# Patient Record
Sex: Male | Born: 1950
Health system: Southern US, Community
[De-identification: ages and names within clinical notes are randomized; demographics above are authoritative.]

## PROBLEM LIST (undated history)

## (undated) DIAGNOSIS — K219 Gastro-esophageal reflux disease without esophagitis: Secondary | ICD-10-CM

## (undated) DIAGNOSIS — G4733 Obstructive sleep apnea (adult) (pediatric): Secondary | ICD-10-CM

## (undated) DIAGNOSIS — I2510 Atherosclerotic heart disease of native coronary artery without angina pectoris: Secondary | ICD-10-CM

## (undated) DIAGNOSIS — K429 Umbilical hernia without obstruction or gangrene: Secondary | ICD-10-CM

## (undated) DIAGNOSIS — I2119 ST elevation (STEMI) myocardial infarction involving other coronary artery of inferior wall: Secondary | ICD-10-CM

## (undated) DIAGNOSIS — IMO0001 Reserved for inherently not codable concepts without codable children: Secondary | ICD-10-CM

## (undated) DIAGNOSIS — I4819 Other persistent atrial fibrillation: Secondary | ICD-10-CM

## (undated) DIAGNOSIS — R0609 Other forms of dyspnea: Secondary | ICD-10-CM

## (undated) DIAGNOSIS — Z951 Presence of aortocoronary bypass graft: Secondary | ICD-10-CM

## (undated) DIAGNOSIS — E785 Hyperlipidemia, unspecified: Secondary | ICD-10-CM

## (undated) DIAGNOSIS — F419 Anxiety disorder, unspecified: Secondary | ICD-10-CM

## (undated) DIAGNOSIS — Z9861 Coronary angioplasty status: Secondary | ICD-10-CM

## (undated) DIAGNOSIS — C44319 Basal cell carcinoma of skin of other parts of face: Secondary | ICD-10-CM

## (undated) DIAGNOSIS — M1990 Unspecified osteoarthritis, unspecified site: Secondary | ICD-10-CM

## (undated) DIAGNOSIS — R0600 Dyspnea, unspecified: Secondary | ICD-10-CM

## (undated) DIAGNOSIS — I10 Essential (primary) hypertension: Secondary | ICD-10-CM

## (undated) HISTORY — PX: NM MYOVIEW LTD: HXRAD82

## (undated) HISTORY — DX: Reserved for inherently not codable concepts without codable children: IMO0001

## (undated) HISTORY — DX: Hyperlipidemia, unspecified: E78.5

## (undated) HISTORY — DX: Obstructive sleep apnea (adult) (pediatric): G47.33

## (undated) HISTORY — DX: Other persistent atrial fibrillation: I48.19

## (undated) HISTORY — PX: COLONOSCOPY: SHX174

## (undated) HISTORY — DX: Presence of aortocoronary bypass graft: Z95.1

## (undated) HISTORY — DX: Atherosclerotic heart disease of native coronary artery without angina pectoris: I25.10

## (undated) HISTORY — PX: OTHER SURGICAL HISTORY: SHX169

## (undated) HISTORY — PX: SKIN CANCER EXCISION: SHX779

## (undated) HISTORY — DX: Dyspnea, unspecified: R06.00

## (undated) HISTORY — DX: Other forms of dyspnea: R06.09

## (undated) HISTORY — DX: ST elevation (STEMI) myocardial infarction involving other coronary artery of inferior wall: I21.19

## (undated) HISTORY — DX: Umbilical hernia without obstruction or gangrene: K42.9

## (undated) HISTORY — PX: TONSILLECTOMY: SUR1361

## (undated) HISTORY — DX: Basal cell carcinoma of skin of other parts of face: C44.319

## (undated) HISTORY — DX: Coronary angioplasty status: Z98.61

---

## 2000-01-24 ENCOUNTER — Encounter: Payer: Self-pay | Admitting: Emergency Medicine

## 2000-01-25 ENCOUNTER — Inpatient Hospital Stay (HOSPITAL_COMMUNITY): Admission: EM | Admit: 2000-01-25 | Discharge: 2000-01-29 | Payer: Self-pay | Admitting: Emergency Medicine

## 2000-01-25 DIAGNOSIS — I2119 ST elevation (STEMI) myocardial infarction involving other coronary artery of inferior wall: Secondary | ICD-10-CM

## 2000-01-25 HISTORY — PX: PERCUTANEOUS CORONARY STENT INTERVENTION (PCI-S): SHX6016

## 2000-01-25 HISTORY — DX: ST elevation (STEMI) myocardial infarction involving other coronary artery of inferior wall: I21.19

## 2000-06-26 ENCOUNTER — Encounter: Payer: Self-pay | Admitting: Family Medicine

## 2000-06-26 ENCOUNTER — Encounter: Admission: RE | Admit: 2000-06-26 | Discharge: 2000-06-26 | Payer: Self-pay | Admitting: Family Medicine

## 2001-01-30 ENCOUNTER — Inpatient Hospital Stay (HOSPITAL_COMMUNITY): Admission: EM | Admit: 2001-01-30 | Discharge: 2001-02-01 | Payer: Self-pay | Admitting: Emergency Medicine

## 2001-01-30 ENCOUNTER — Encounter: Payer: Self-pay | Admitting: Emergency Medicine

## 2001-01-30 HISTORY — PX: PERCUTANEOUS CORONARY STENT INTERVENTION (PCI-S): SHX6016

## 2001-11-05 ENCOUNTER — Inpatient Hospital Stay (HOSPITAL_COMMUNITY): Admission: AD | Admit: 2001-11-05 | Discharge: 2001-11-10 | Payer: Self-pay | Admitting: Unknown Physician Specialty

## 2001-11-05 ENCOUNTER — Encounter: Payer: Self-pay | Admitting: *Deleted

## 2001-11-05 DIAGNOSIS — I2510 Atherosclerotic heart disease of native coronary artery without angina pectoris: Secondary | ICD-10-CM | POA: Insufficient documentation

## 2001-11-05 HISTORY — DX: Atherosclerotic heart disease of native coronary artery without angina pectoris: I25.10

## 2001-11-05 HISTORY — PX: CARDIAC CATHETERIZATION: SHX172

## 2001-11-06 ENCOUNTER — Encounter: Payer: Self-pay | Admitting: Surgery

## 2001-11-06 DIAGNOSIS — Z951 Presence of aortocoronary bypass graft: Secondary | ICD-10-CM

## 2001-11-06 HISTORY — PX: CORONARY ARTERY BYPASS GRAFT: SHX141

## 2001-11-06 HISTORY — DX: Presence of aortocoronary bypass graft: Z95.1

## 2001-11-07 ENCOUNTER — Encounter: Payer: Self-pay | Admitting: Surgery

## 2001-11-08 ENCOUNTER — Encounter: Payer: Self-pay | Admitting: Surgery

## 2001-12-03 ENCOUNTER — Encounter: Admission: RE | Admit: 2001-12-03 | Discharge: 2001-12-03 | Payer: Self-pay | Admitting: Surgery

## 2001-12-03 ENCOUNTER — Encounter: Payer: Self-pay | Admitting: Surgery

## 2003-09-12 ENCOUNTER — Emergency Department (HOSPITAL_COMMUNITY): Admission: EM | Admit: 2003-09-12 | Discharge: 2003-09-12 | Payer: Self-pay | Admitting: *Deleted

## 2004-09-10 ENCOUNTER — Emergency Department (HOSPITAL_COMMUNITY): Admission: EM | Admit: 2004-09-10 | Discharge: 2004-09-10 | Payer: Self-pay | Admitting: Emergency Medicine

## 2005-06-02 ENCOUNTER — Emergency Department (HOSPITAL_COMMUNITY): Admission: EM | Admit: 2005-06-02 | Discharge: 2005-06-03 | Payer: Self-pay | Admitting: Emergency Medicine

## 2008-10-15 ENCOUNTER — Emergency Department (HOSPITAL_COMMUNITY): Admission: EM | Admit: 2008-10-15 | Discharge: 2008-10-16 | Payer: Self-pay | Admitting: Emergency Medicine

## 2009-06-07 ENCOUNTER — Encounter: Admission: RE | Admit: 2009-06-07 | Discharge: 2009-06-07 | Payer: Self-pay | Admitting: Family Medicine

## 2009-06-30 DIAGNOSIS — Z951 Presence of aortocoronary bypass graft: Secondary | ICD-10-CM

## 2009-07-01 ENCOUNTER — Ambulatory Visit: Payer: Self-pay | Admitting: Emergency Medicine

## 2009-07-01 DIAGNOSIS — I2119 ST elevation (STEMI) myocardial infarction involving other coronary artery of inferior wall: Secondary | ICD-10-CM | POA: Insufficient documentation

## 2009-07-01 DIAGNOSIS — J449 Chronic obstructive pulmonary disease, unspecified: Secondary | ICD-10-CM

## 2009-07-01 DIAGNOSIS — J4489 Other specified chronic obstructive pulmonary disease: Secondary | ICD-10-CM | POA: Insufficient documentation

## 2009-07-27 ENCOUNTER — Encounter: Payer: Self-pay | Admitting: Emergency Medicine

## 2009-08-03 ENCOUNTER — Encounter: Payer: Self-pay | Admitting: Emergency Medicine

## 2009-08-03 ENCOUNTER — Ambulatory Visit: Payer: Self-pay | Admitting: Emergency Medicine

## 2010-12-06 LAB — DIFFERENTIAL
Basophils Absolute: 0 10*3/uL (ref 0.0–0.1)
Basophils Relative: 0 % (ref 0–1)
Eosinophils Absolute: 0.2 10*3/uL (ref 0.0–0.7)
Eosinophils Relative: 2 % (ref 0–5)
Lymphocytes Relative: 20 % (ref 12–46)
Lymphs Abs: 1.9 10*3/uL (ref 0.7–4.0)
Monocytes Absolute: 0.7 10*3/uL (ref 0.1–1.0)
Monocytes Relative: 7 % (ref 3–12)
Neutro Abs: 6.7 10*3/uL (ref 1.7–7.7)
Neutrophils Relative %: 71 % (ref 43–77)

## 2010-12-06 LAB — POCT I-STAT, CHEM 8
BUN: 16 mg/dL (ref 6–23)
Calcium, Ion: 1.17 mmol/L (ref 1.12–1.32)
Chloride: 104 mEq/L (ref 96–112)
Creatinine, Ser: 1.3 mg/dL (ref 0.4–1.5)
Glucose, Bld: 160 mg/dL — ABNORMAL HIGH (ref 70–99)
HCT: 51 % (ref 39.0–52.0)
Hemoglobin: 17.3 g/dL — ABNORMAL HIGH (ref 13.0–17.0)
Potassium: 3.7 mEq/L (ref 3.5–5.1)
Sodium: 140 mEq/L (ref 135–145)
TCO2: 25 mmol/L (ref 0–100)

## 2010-12-06 LAB — CBC
HCT: 48.1 % (ref 39.0–52.0)
Hemoglobin: 16.4 g/dL (ref 13.0–17.0)
MCHC: 34 g/dL (ref 30.0–36.0)
MCV: 94 fL (ref 78.0–100.0)
Platelets: 254 10*3/uL (ref 150–400)
RBC: 5.12 MIL/uL (ref 4.22–5.81)
RDW: 13.6 % (ref 11.5–15.5)
WBC: 9.4 10*3/uL (ref 4.0–10.5)

## 2010-12-06 LAB — GLUCOSE, CAPILLARY: Glucose-Capillary: 174 mg/dL — ABNORMAL HIGH (ref 70–99)

## 2011-01-04 HISTORY — PX: TRANSTHORACIC ECHOCARDIOGRAM: SHX275

## 2011-01-06 NOTE — Discharge Summary (Signed)
[place]. [facility]  Patient:    Colin, Ryan Visit Number: [id number] MRN: [id number]          Service Type: MED Location: 2000 2009 01 Attending Physician:  Cleatrice Burke Dictated by:   Maxwell Marion, R.N.F.A. Admit Date:  11/05/2001 Discharge Date: 11/10/2001   CC:         Onalee Hua B. Georgina Pillion, M.D.  Lenise Herald, M.D.   Discharge Summary  DATE OF BIRTH:  1951-07-05.  ADMITTING DIAGNOSIS:  Coronary artery disease.  DISCHARGE DIAGNOSIS:  Three-vessel coronary artery disease including left main disease, status post coronary artery bypass grafting.  HISTORY OF PRESENT ILLNESS:  [name] is a 60 year old Caucasian man who is status post MI in June 2001.  On January 25, 2000, he had a stent placement with initially good results.  He was discharged home but had recurrent symptoms. He was readmitted to the hospital, underwent repeat catheterization and stent placement on January 31, 2000.  He has done well since then except that he and his wife report a several-month history of increasing fatigue, also occasional mild substernal chest pressure.  He was evaluated on August 23, 2001.  He underwent a Cardiolite stress test, which revealed inferior wall thinning and ischemia and an ejection fraction of approximately 56%.  He was evaluated at [facility] on February 25.  It was then recommended he undergo cardiac catheterization.  As this was going to be a possible brachytherapy procedure, he was scheduled for March 18.  HOSPITAL COURSE:  Mr. Sakata was admitted to [facility] in the care of [facility] on November 05, 2001.  He underwent a cardiac catheterization which revealed significant two-vessel coronary artery disease, including significant left main disease.  A mildly decreased left ventricular function.  CVTS consult was obtained with Dr. Evelene Croon.  After examination of the  patient and review of the records and the catheterization films, Dr. Laneta Simmers recommended coronary artery bypass grafting as preferred treatment of choice for this gentleman.  The procedure risks and benefits were discussed with the patient and his wife.  They agreed to proceed.  Doppler studies performed on March 18 revealed no significant carotid artery disease. His upper extremity Doppler studies were within normal limits, and he was noted to have bilateral palpable pedal pulses.  On March 19, Mr. Doty underwent an uncomplicated coronary artery bypass grafting x3 with Dr. Evelene Croon.  Grafts placed at the time of the procedure:  Left internal mammary artery to the left anterior descending artery, right internal mammary artery to the right coronary artery, left radial artery was grafted to the circumflex obtuse marginal artery.  He tolerated this procedure well and was transferred in stable condition to the SICU.  Mr. Scallon postoperative course has been uneventful.  He has remained hemodynamically stable.  His CBGs were elevated in the postoperative period. A hemoglobin a1C drawn was at 5.9.  His CBGs have since returned to the normal range by postoperative day 3.  Mr. Reddy is progressing very well in recovery from his surgery.  It is anticipated he will be ready for discharge home tomorrow, November 10, 2001.  CONDITION ON DISCHARGE:  Improved.  DISCHARGE INSTRUCTIONS:  Instructions on discharge include medications on discharge, activity, diet, wound care, and follow-up appointments.  Please see discharge instruction sheet for details.  DISCHARGE MEDICATIONS: 1. Enteric-coated aspirin 325 mg p.o. q.d. 2. Tylox one to two p.o. q.4-6h. p.r.n. for  pain. 3. Metoprolol 25 mg p.o. q.12h. 4. Lasix 40 mg p.o. q.d. x5 days. 5. Potassium chloride 20 mEq p.o. q.d. x5 days.  He is instructed to resume the following medications: 1. Lipitor 10 mg p.o. q.d. 2. Isosorbide 30 mg p.o.  q.d. 3. Niacin 500 mg p.o. q.d.  FOLLOW-UP: 1. He has been asked to get an appointment to see Dr. Jenne Campus at his office    in approximately two weeks. 2. He will have an appointment to see Dr. Laneta Simmers at the CVTS office in    approximately three weeks.  The office will call with the date and time    for that appointment.  He also will be asked to get a chest x-ray at [facility]    approximately one hour before that appointment.     PAST MEDICAL HISTORY: 1. Coronary artery disease, status post MI 2001. 2. Hyperlipidemia. 3. Hypertension.  ALLERGIES:  No known drug allergies. Dictated by:   Maxwell Marion, R.N.F.A. Attending Physician:  Cleatrice Burke DD:  11/09/01 TD:  11/11/01 Job: [id number]

## 2011-01-06 NOTE — Cardiovascular Report (Signed)
[place]. [facility]  Patient:    Colin Ryan, Colin Ryan                         MRN: [id number] Proc. Date: 01/30/01 Adm. Date:  [id number] Attending:  Ruta Hinds                        Cardiac Catheterization  PROCEDURES: 1. Left heart catheterization. 2. Coronary angiography. 3. Left ventriculogram. 4. Left anterior descending - proximal; placement of intracoronary stent    x2.  COMPLICATIONS:  None.  INDICATIONS:  The patient is a 60 year old, white male, patient of Dr. Lenise Herald with a history of acute MI in June of 2001 and underwent subsequent PTCA and stenting of his RCA.  The patient has continued to use tobacco intermittently.  He subsequently had a stress Cardiolite revealing no evidence of ischemia in his LAD territory with mild peri-infarct ischemia in the inferior wall.  Of note, he was noted to have a 60% proximal LAD lesion at his last catheterization with 80% lesion of a small diagonal.  He presented to the ER on January 30, 2001, with stuttering chest pain.  He is now brought back to the cardiac catheterization lab to define his coronary anatomy.  DESCRIPTION OF PROCEDURE:  After given informed written consent, the patient was brought to the cardiac catheterization lab where right and left groins were shaved, prepped, and draped in the usual sterile fashion.  ECG monitoring was established.  Using modified Seldinger technique, a #6 French arterial sheath was inserted in the right femoral artery.  A 6 French diagnostic catheter was then used to perform diagnostic angiography.  This reveals a large left main with proximal 50% ostial stenosis.  The LAD is a large vessel which coursed to the apex and gave rise to two diagonal branches.  There is a 90% at the proximal LAD stenotic lesion just prior to the takeoff of the first diagonal and septal perforator.  The remainder of the LAD is irregular but has no high-grade lesion.  The  first diagonal is a small vessel with an 80% ostial lesion.  The second diagonal is a medium sized vessel with no significant disease.  Left circumflex is a medium sized vessel which coursed in the AV groove and gave rise to one large obtuse marginal branch.  The AV groove circumflex is noted to have a 50% ostial lesion but no high-grade stenosis.  The first OM is a large vessel which bifurcates distally and has a 30% mid vessel stenotic lesion.  The right coronary artery is a medium sized vessel which is dominant and gives rise to both the PDA as well as the posterolateral branch.  There is a stent noted in the mid RCA in the area of a shepherds crook.  There is mild 30-40% irregularities throughout the mid RCA stent with a 60% stenotic lesion distal to the stent which appears to be mildly hypodense.  There is a large PDA and posterolateral branch with no significant disease.  There is TIMI-3 flow in the distal vessel.  LEFT VENTRICULOGRAM:  Left ventriculogram reveals a mildly depressed EF of 45-50% with mild anterolateral hypokinesis.  HEMODYNAMICS:  Systemic arterial pressure 120/78, LV systemic pressure 122/5, LVEDP of 10.  INTERVENTIONAL PROCEDURE:  LAD - proximal:  Following diagnostic angiography a #6 arterial sheath was exchanged for a #7 [place] arterial sheath, a #7 [place] guiding catheter  was coaxially engaged in the left coronary ostium. Selective coronary angiogram was then performed.  Next, a 0.014 short Forte guide wire was advanced out of the guiding catheter into the proximal LAD. The guide wire was positioned in the distal LAD without difficulty.  Measurements were made of the LAD stenotic lesion with a Forte wire on the way to apex of the LAD.  Next, a Bx Velocity 3.0 x 18 mm coronary stent was then positioned over the proximal LAD stenotic lesion.  This stent was then deployed to a maximum of 16 atmospheres for a total of one minute.  Followup  angiogram revealed no evidence of thrombus.  However, there was a small stepdown at the distal portion of the stent which did not appear to be flow-limiting.  This stent balloon was then removed and a second Bx Velocity 3.0 x 8 mm stent was then positioned across the stepdown area with careful attention to overlap the distal portion of the stent.  This stent was then deployed to a maximum of 14 atmospheres for a total of one minute.  The balloon was then pulled back over the overlapping segment and one additional inflation to a maximum of 16 atmospheres was then performed for a total of 30 seconds.  Followup angiogram revealed no evidence of dissection or thrombus with TIMI-3 flow of the distal vessel.  IV Integrilin was given.  Intravenous doses of heparin were given to maintain the ACT between 200 and 300.  Final orthogonal angiograms reveal less than 10% residual stenosis in the proximal LAD stenotic lesion with TIMI-3 flow to the distal vessel.  At this point we elected to conclude the procedure.  All balloons, wires, and catheters were removed.  Hemostatic sheaths were sewn in place.  The patient was transferred back to the ward in stable condition.  CONCLUSIONS: 1. Successful stenting of the proximal left anterior descending stenotic    lesion using Bx Velocity 3.0 x 18 and 3.0 x 8 coronary stents in the    proximal left anterior descending stenotic lesion. 2. A 40-50% ostial left main stenosis. 3. Mild in-stent re-stenosis of the mid right coronary artery with a 60%    mid right coronary artery stenotic lesion distal to the stent. 4. Mildly depressed left ventricular systolic function with wall motion    abnormality as noted above. 5. Adjunct use of Integrilin infusion. DD:  01/30/01 TD:  01/30/01 Job: [id number]

## 2011-01-06 NOTE — Op Note (Signed)
[place]. [facility]  Patient:    Colin Ryan, Colin Ryan Visit Number: [id number] MRN: [id number]          Service Type: MED Location: 2300 2308 01 Attending Physician:  Cleatrice Burke Dictated by:   Alleen Borne, M.D. Proc. Date: 11/06/01 Admit Date:  11/05/2001   CC:         Lenise Herald, M.D.  Cath Lab   Operative Report  PREOPERATIVE DIAGNOSIS:  Left main and three-vessel coronary artery disease.  POSTOPERATIVE DIAGNOSIS:  Left main and three-vessel coronary artery disease.  OPERATION PERFORMED:  Median sternotomy, extracorporeal circulation, coronary artery bypass graft surgery times three using a left internal mammary artery graft to the left anterior descending coronary artery, a right internal mammary artery graft to the posterior descending branch of the right coronary artery, and a left radial artery graft to the obtuse marginal branch of the left circumflex coronary artery.  SURGEON:  Alleen Borne, M.D.  ASSISTANT:  Adair Patter, P.A.  ANESTHESIA:  General endotracheal.  INDICATIONS FOR PROCEDURE:  The patient is a 60 year old white male with a history of coronary artery disease status post inferior myocardial infarction and stenting of the right coronary artery on January 25, 2000.  He subsequently had a stent placed in the proximal LAD on January 30, 2001.  He now presents with a several month history of fatigue and mild intermittent chest pain and had a positive Cardiolite scan showing inferior ischemia.  Catheterization showed a 50% ostial left main stenosis.  There was 50 to 60% LAD stenosis beyond the stent.  There were two small diagonal branches.  There was 90% ostial left circumflex stenosis.  There was a 70% in-stent restenosis in the right coronary artery.  Ejection fraction was 45 to 50%. After review of the angiograms and examination of the patient it was felt that coronary artery bypass surgery was the best treatment.  I  felt that using a bilateral internal mammary graft and a left radial artery graft would be the best treatment for this relatively young patient.  Preoperative Doppler examination showed patent superficial palmar arches bilaterally with negative Allens test bilaterally.  I discussed the operative procedure with the patient and his wife. including alternatives to surgery, benefits, and risks including bleeding, possible blood transfusion, infection, stroke, myocardial infarction, and death.  They understood and agreed to proceed with surgery.  DESCRIPTION OF PROCEDURE:  The patient was taken to the operating room and placed on the table in supine position.  After induction of general endotracheal anesthesia, a Foley catheter was placed in the bladder using sterile technique.  Then the left arm was positioned on an arm board out at the patients side and was prepped with Betadine soap and solution and draped in the usual sterile manner.  The left radial artery was then harvested as a free pedicle.  It was a large caliber vessel.  Prior to dividing the radial artery, an atraumatic vascular clamp was applied distally and we documented that there was a good Doppler arterial signal beyond the clamp in the left wrist.  The proximal and distal ends of the radial artery were suture ligated. Hemostasis was achieved.  The forearm muscles were reapproximated with continuous 2-0 Vicryl suture.  The subcutaneous tissues were closed with continuous 2-0 Vicryl and the skin with 3-0 Vicryl subcuticular closure.  The sponge, needle and instrument counts were correct according to the scrub nurse.  A dry sterile dressing was applied over the incision  and the arm was wrapped with an Ace wrap.  Then the patient was repositioned with the left arm at the side.  Then the neck, chest, abdomen and both lower extremities were prepped and draped in the usual sterile manner.  The chest was entered through a median  sternotomy incision and the pericardium opened in the midline.  Examination of the heart showed good ventricular contractility.  There was a large amount of epicardial fat.  Then the left internal mammary artery was harvested from the chest wall as a pedicle graft.  This was a medium caliber vessel with excellent blood flow through it.  Then the right internal mammary artery graft was harvested as a pedicle.  This was a medium caliber vessel with excellent blood flow through it.  Then the patient was heparinized and when an adequate activated clotting time was achieved, the distal ascending aorta was cannulated using a 24 French aortic cannula for arterial inflow.  Venous outflow was achieved using a two-stage venous cannula through the right atrial appendage.  An antegrade cardioplegia and vent cannula was inserted into the aortic root.  The patient was placed on cardiopulmonary bypass and the distal coronary arteries were identified.  The LAD was a large graftable vessel with no significant distal disease in it.  The diagonal branches were small nongraftable vessels.  The obtuse marginal was a large bifurcating vessel. This artery was heavily diseased before the bifurcation.  Neither one of the sub branches had disease in them.  The right coronary artery was heavily diseased in its proximal and midportions.  There was some plaque present in the distal portion of the artery just before the take off of the posterior descending branch.  Then the aorta was cross-clamped and 500 cc of cold blood antegrade cardioplegia was administered in the aortic root with quick arrest of the heart.  Systemic hypothermia to 34 degrees centigrade and topical hypothermia with iced saline was used.  A temperature probe was placed in the septum and an insulating pad in the pericardium.  The first distal anastomosis was performed to the obtuse marginal branch.  The  internal diameter was 1.75 mm.  The  conduit used was the left radial artery graft and this was anastomosed in end-to-side manner using continuous 8-0 Prolene suture.  The flow was measured through the graft and was excellent. Then another dose of cardioplegia was given down this graft.  The second distal anastomosis was performed to posterior descending coronary artery coronary artery.  The internal diameter was about 2 mm.  The conduit used was the right internal mammary artery graft and this was brought through an opening in the right pericardium anterior to the phrenic nerve.  It was anastomosed to the posterior descending coronary artery in end-to-side manner using continuous 8-0 Prolene suture.  The pedicle was tacked to the epicardium with 6-0 Prolene sutures.  Then the patient was then another dose of cardioplegia.  The third distal anastomosis was performed to the midportion of the left anterior descending coronary artery.  The internal diameter was 2.5 mm.  The conduit used was the left internal mammary graft and this was brought through an opening in the left pericardium anterior to the phrenic nerve.  It was anastomosed to the LAD in an end-to-side manner using continuous 8-0 Prolene suture.  The pedicle was tacked to the epicardium with 6-0 Prolene sutures. The patient was rewarmed to 37 degrees and the clamp removed from the mammary pedicle.  There was rapid  warming of the ventricular septum and return of spontaneous sinus rhythm.  The crossclamp was removed with a time of 62 minutes.  A partial occlusion clamp was placed on the aortic root and the proximal anastomosis of the radial graft to the aortic root was performed in an end-to-side using continuous 6-0 Prolene suture.  The clamps were removed. The proximal and distal anastomoses appeared hemostatic and the line of the grafts satisfactory.  Graft markers were placed around the proximal anastomoses.  Two temporary right ventricular and right atrial  pacing wires were placed and brought out through the skin.  When the patient had rewarmed to 37 degrees centigrade, he was weaned from cardiopulmonary bypass on no inotropic agents.  Total bypass time was 105 minutes.  Cardiac function appeared excellent with a cardiac output of 9L per minute.  Protamine was given and the venous and aortic cannulas were removed without difficulty.  Hemostasis was achieved.  Four chest tubes were placed with bilateral pleural tubes, a tube in the posterior pericardium and one in the anterior mediastinum.  The pericardium was reapproximated over the heart. The sternum was closed with #6 stainless steel wires.  Double wires were used in this patient.  The fascia was closed with continuous #1 Vicryl suture.  The subcutaneous tissues were closed using continuous 2-0 Vicryl and the skin with 3-0 Vicryl subcuticular closure.  The sponge, needle and instrument counts were correct according to the scrub nurse.  A dry sterile dressing was applied over the incisions and around the chest tubes which were hooked to Pleur-evac suction.  The patient remained hemodynamically stable and was transported to the SICU in guarded but stable condition. Dictated by:   Alleen Borne, M.D. Attending Physician:  Cleatrice Burke DD:  11/06/01 TD:  11/07/01 Job: [phone] [id number]

## 2011-01-06 NOTE — Discharge Summary (Signed)
[place]. [facility]  Patient:    Colin Ryan, Colin Ryan                         MRN: [id number] Adm. Date:  [id number] Disc. Date: [id number] Attending:  Darlin Priestly Dictator:   Darcella Gasman. Annie Paras, F.N.P.C. CC:         [facility], Dr. Schuyler Amor, M.D.                           Discharge Summary  DISCHARGE DIAGNOSES: 1. Acute inferior myocardial infarction. 2. Coronary artery disease with rescue percutaneous transluminal coronary    angiography and stent to the right coronary artery.    a. Significant disease to the left anterior descending and diagonal system.    b. Normal left ventricular function. 3. Nonsustained ventricular tachycardia. 4. Tobacco use. 5. Hyperlipidemia. 6. Positive Hemoccult.  DISCHARGE CONDITION:  Improved.  PROCEDURES: 1. On January 25, 2000, emergent left heart catheterization, combined left heart    catheterization. 2. On January 25, 2000, PTCA with stent deployment to the right coronary artery. 3. On January 25, 2000, ReoPro infusion.  DISCHARGE MEDICATIONS: 1. Plavix 75 mg 1 a day with meals for four weeks. 2. Enteric-coated aspirin 325 mg once a day. 3. Altace 2.5 mg twice a day. 4. Lopressor 50 mg 1 twice a day. 5. Wellbutrin SR 150 mg 1 a day. 6. Nitroglycerin sublingual p.r.n. chest pain.  DISCHARGE INSTRUCTIONS: 1. No driving for two weeks. 2. No smoking. 3. No strenuous activity. 4. No sexual activity. 5. No lifting over 10 pounds for the next two weeks. 6. Low-fat, low-salt, low-cholesterol diet. 7. Monitor right groin cath site for bruising, bleeding, inflammation.  Call    if fever or any of the above. 8. Followup with Elisha Ponder R.N., regarding previous study. 9. See Dr. Jenne Campus in two weeks, call the office for an appointment.  HISTORY OF PRESENT ILLNESS:  This is a 60 year old white male without significant past medical history.  He came to the emergency room on January 25, 2000 about  12:30 complaining of substernal chest pain since 10:15 p.m.  He had had a history of intermittent substernal chest pain x 3, lasting 10 to 20 minutes.  The evening prior to admission increased on significance of intensity with shortness of breath and radiation to the left arm and diaphoretic.  He came to the emergency room, ST elevations in his inferior leads, taken emergently to the cath lab.  IV heparin and IV nitroglycerin were started in the cath lab.  PAST MEDICAL HISTORY:  Negative.  OUTPATIENT MEDICATIONS:  None.  ALLERGIES:  No known allergies.  FAMILY HISTORY:  Father is age 46 and in good health.  Mother died at 46 with liver cancer.  One sister is dead and one brother in good health.  SOCIAL HISTORY:  Is married with two children.  He smokes one pack per day x 20 years.  PHYSICAL EXAMINATION AT DISCHARGE:  VITAL SIGNS:  Blood pressure 100/58, pulse 60, respirations 20, temperature 97.  GENERAL:  Alert and oriented white male.  HEART:  S1, S2, regular rate and rhythm.  LUNGS:  Clear without rales, rhonchi or wheezes.  ABDOMEN:  Soft, nontender, positive bowel sounds.  EXTREMITIES:  Without edema.  ADMISSION LABORATORIES:   WBC 12.5, hemoglobin 15, hematocrit 43, MCV  90, platelets 327, neutrophils 67, lymphs 25, monos 4, eos 2, basos 0, leukocytes 2.  WBC dropped down to 8.8.  Hemoglobin remained stable as well as platelets. Occult blood was positive on January 27, 2000.  Pro time 12.8, INR of 1, PTT of 27.  Chemistries:  Sodium on admission 139, potassium 3.7, chloride 103, CO2 25, glucose 149 but then dropped to 93, BUN 16, creatinine 1.3, calcium 8.5, total protein 6.5, albumin 3.4, AST 23, ALT 24, alk phos 114, total bili 0.4. CK-MB peak was 789 with MB of 75 and troponin peak was 1.27, positive for MI. They did come down prior to discharge.  Lipid panel, total cholesterol 203, triglycerides 135, HDL 32, LDL 144.  Chest x-ray on admission:  Lungs are clear, mild  cardiomegaly, no bony abnormality, no active lung disease.  EKG on admission, June 5, ST elevation in II, III and aVF with recipratory changes in aVL.  Acute inferior MI after his cath on June 6, Q-waves in lead III with flipped Ts in II, III and aVF. Continued followup reveals inferior infarction.  On January 25, 2000, cardiac cath, please see dictated report.  Did have 100% proximal stenosis on a PTCA with stent deployment and that was successful. Does continue with significant disease of the LAD.  HOSPITAL COURSE:  Mr. Hevener was admitted on January 25, 2000, by Dr. Jenne Campus, for an acute inferior MI.  Emergent cath with rescue PTCA and stent deployment. Patient was on heparin and nitroglycerin.  ReoPro was given.  Lipids were elevated and he would be entered in a PROVE-IT study if he was to agree.  The patient, by June 7, had improved, drips were off, he was doing much better and no chest pain.  He did have 7 beats of nonsustained ventricular tachycardia, beta blocker was increased.  Continued to improve, ambulated without difficulty and by January 29, 2000, was stable and ready for discharge to home. DD:  03/04/00 TD:  03/04/00 Job: [id number]

## 2011-01-06 NOTE — Cardiovascular Report (Signed)
[place]. [facility]  Patient:    Colin Ryan, Colin Ryan Visit Number: [id number] MRN: [id number]          Service Type: CAT Location: 2300 [id number] [id number] Attending Physician:  Cleatrice Burke Dictated by:   Darlin Priestly, M.D. Proc. Date: 11/05/01 Admit Date:  11/05/2001   CC:         Oley Balm. Georgina Pillion, M.D.   Cardiac Catheterization  PROCEDURES: 1. Left heart catheterization. 2. Coronary angiography. 3. Left ventriculogram.  COMPLICATIONS: None.  INDICATIONS: The patient is a 60 year old male, patient of Dr. Lajean Manes, with a history of PCA and stenting of his RCA in June of 2001 after he presented with acute inferior wall MI. He had recurrent chest pain on repeat catheterization in June 2002 with a 90% lesion in his proximal LAD which he underwent PTCA and stenting. At that time, he was noted to have mild disease of his circumflex as well as a 50% ostial RCA. The patient continued to have intermittent episodes of chest pain and underwent repeat Cardiolite scan revealing inferior wall ischemia. He is now referred back for repeat catheterization.  DESCRIPTION OF PROCEDURE: After given informed written consent, the patient was brought to the cardiac catheterization lab where his right and left groins were shaved, prepped, and draped in the usual sterile fashion.  ECG monitoring was established.  Using modified Seldinger technique, a #6 French arterial sheath was inserted in the right femoral artery.  A 6 French diagnostic catheter was then used to perform diagnostic angiography.  This reveals a large left main with 50% ostial lesion.  The LAD is a medium sized vessel who coursed to the apex and gave rise to two diagonal branches. The LAD is noted to have a stent in its proximal portion, which appears widely patent with mild irregularities in the stents. He does have mild distal 20% LAD irregularities. The first diagonal is a small vessel with 90% ostial  lesion. The second diagonal is a medium sized vessel with no significant disease.  The left circumflex is a large vessel which coursed in the AV groove and gave rise to one obtuse marginal branch. The AV groove circumflex has 90% ostial lesion. There is a 30% mid vessel lesion.  The first OM is a large vessel which bifurcates in its mid segment and has a 30% proximal and 40% mid vessel lesion.  The right coronary artery is a large vessel which is dominant and gives rise to both the PDA as well as the posterolateral branch. There is a stent noted in the midportion of the RCA, which appears to have a 70% lucency in the distal portion of the stent. There is TIMI-3 flow of the distal vessel. The PDA and posterolateral branches are large vessels with no significant disease.  LEFT VENTRICULOGRAM: The left ventriculogram reveals a mildly depressed EF at 45-50% with mild global hypokinesis.  HEMODYNAMICS: Systemic arterial pressure 107/64, LV systemic pressure 108/8 LVEDP of 20.  CONCLUSIONS: 1. Significant left main and two-vessel coronary artery disease. 2. Mildly depressed left ventricular systolic function. Dictated by:   Darlin Priestly, M.D. Attending Physician:  Cleatrice Burke DD:  11/05/01 TD:  11/06/01 Job: [id number] [id number]

## 2011-01-06 NOTE — Discharge Summary (Signed)
[place]. [facility]  Patient:    Colin Ryan, Colin Ryan                         MRN: [id number] Adm. Date:  [id number] Disc. Date: 01/31/01 Attending:  Ruta Hinds Dictator:   Halford Decamp. Delanna Ahmadi, R.N., N.P. CC:         Oley Balm. Georgina Pillion, M.D.   Discharge Summary  HISTORY OF PRESENT ILLNESS:  [name] is a 60 year old, white, married, male patient with a prior history of coronary artery disease.  He came into the hospital with unstable angina.  He had an acute inferior MI on January 25, 2000, with subsequent PTCA and stenting to his proximal RCA.  He had some NSVT post procedure.  He had been doing well until the week before admission.  He was having episodic chest pain lasting 15 seconds, occurring daily.  On the day of admission, he was taking a shower.  He developed midsternal chest pain with right tingling that last 15 minutes.  It was relieved with one nitroglycerin. He went to work and the pain recurred.  He took a nitroglycerin.  He then went home and his wife brought him to the emergency room.  His EKG in the emergency room was without acute changes.  HOSPITAL COURSE:  He was admitted, put on IV heparin, and IV nitroglycerin. He underwent cardiac catheterization on January 30, 2001, which revealed 50% left main, 90% proximal LAD, 80% proximal diagonal 1, 60% ostial circumflex, and his stent had a 40 restenosis with 60% distal to the stent.  His EF was 45-50%.  He had some anterolateral hypokinesis.  He subsequently underwent proximal LAD stenting reduced from 90% to less than 10%.  He was continued on Integrilin for 18 hours.  He was also given Plavix preprocedure.  He did post procedure have the development of a hematoma.  This was expressed.  He was seen by Lenise Herald, M.D., on January 31, 2001.  He was in stable condition. He did have an episode of chest pain the previous night described as a nagging sensation.  He was given some Darvocet for relief.  He  had a small right groin hematoma.  His rhythm was stable.  His blood pressure was stable.  His labs showed a hemoglobin of 13.7, a hematocrit of 40.0, platelets of 247, and WBC of 5.7, sodium 137, potassium 4.3, BUN 13, and creatinine 1.0.  His blood pressure was 102/62, his temperature was 97.1 degrees, and his heart rate was 58.  Lenise Herald, M.D., though that he could be discharged home after his Integrilin was infused.  He walked in the halls.  LABORATORY DATA:  The x-ray showed no acute disease.  His total cholesterol was 166, triglycerides 131, HDL 30, LDL 110.  CK-MB #1 was 175/4.2 and troponin 0.02, #2 134/3.0 and troponin 0.03, #3 147/4.4 and troponin 0.14.  His SGOT and SGPT were within normal limits.  DISCHARGE MEDICATIONS: 1. Plavix 75 mg once per day for one month. 2. Lopressor 50 mg twice per day. 3. Enteric-coated aspirin 325 mg once per day. 4. Lipitor 10 mg once per day. 5. Protonix 40 mg every day. 6. Altace 2.5 mg twice per day. 7. Imdur 30 mg once per day. 8. Nitroglycerin p.r.n.  ACTIVITY:  He should do no strenuous activity, no lifting, and no driving x 4 days.  DIET:  He will be on a low-fat diet.  FOLLOW-UP:  He  will follow up with Lenise Herald, M.D., on February 15, 2001, at 2:30 p.m.  DISCHARGE DIAGNOSES: 1. Unstable angina. 2. Coronary artery disease.    a. Status post cardiac catheterization with subsequent percutaneous       transluminal coronary angioplasty and stent of his proximal left       anterior descending with positive residual disease, 50% left main,       ostial circumflex of 60%, 80% proximal diagonal, and a 60% hypodense       area after his RCA stent. 3. Ischemic cardiomyopathy with an ejection fraction of 45-50%. 4. Hyperlipidemia.  Lipitor dosage unknown.  We will send him home on 10 mg.    This will be reassessed in the office. 5. Tobacco use.  The patient states that he will quit smoking. DD:  01/31/01 TD:  01/31/01 Job:  [id number]

## 2011-01-06 NOTE — Consult Note (Signed)
[place]. [facility]  Patient:    Colin Ryan, Colin Ryan Visit Number: [id number] MRN: [id number]          Service Type: MED Location: 2300 2308 01 Attending Physician:  Cleatrice Burke Dictated by:   Alleen Borne, M.D. Proc. Date: 11/05/01 Admit Date:  11/05/2001   CC:         Lenise Herald, M.D.   Consultation Report  REFERRING PHYSICIAN:  Lenise Herald, M.D.  REASON FOR CONSULTATION:  Left main and three-vessel coronary disease.  HISTORY OF PRESENT ILLNESS:  This patient is a 60 year old gentleman with a history of coronary disease who suffered an inferior myocardial infarction in June of 2001 and underwent stenting of his right coronary artery on January 25, 2000. At that time, he was noted to have some residual disease in his LAD of about 60% in the midportion and about 80% diagonal stenosis. He had an irregular 20% left main stenosis. He was discharged home and did well until January 30, 2001 at which time he had several bouts of chest pain and was admitted with unstable angina. A cardiac catheterization showed progression of disease with 90% LAD lesion and a 50% left main lesion. His right coronary artery had a 40% in-stent restenosis. There was also about 60% hypodense lesion just beyond the right coronary stent. He underwent LAD stenting at that time. Since then he has done relatively well, but he and his wife report a several-month history of fatigue. They said that he works a 12-hour shift on Friday, Saturday, and Sunday and then basically lays on the couch for the next few days trying to recover. He has had mild substernal chest pressure that is not clearly related to exertion. He has had no shortness of breath. He denies PND and orthopnea. He has had no palpitations and no peripheral edema. When he has episodes of chest pressure, they are very brief and mild in severity and relieved with rest.  PAST MEDICAL HISTORY:  Significant for coronary  disease as mentioned above. He has a history of hyperlipidemia and low HDL. He has a history of hypertension. He is status post two prior surgeries on his right mastoid sinus as a child.  REVIEW OF SYSTEMS:  CONSTITUTIONAL:  He denies fever or chills. His appetite has been normal. His weight has increased slightly due to inactivity. He does have persistent fatigue. EYES:  Negative. ENT:  Negative. ENDOCRINE:  He denies diabetes and hypothyroidism. CARDIOVASCULAR:  As above. RESPIRATORY: He denies cough and sputum production. GASTROINTESTINAL:  He has no nausea or vomiting. He denies melena and bright red blood per rectum. GENITOURINARY:  He has had no dysuria or hematuria. MUSCULOSKELETAL:  He does have some pain in his knees and ankles. NEUROLOGICAL:  He has had no focal weakness or numbness. He denies dizziness and syncope. ALLERGIES:  None. HEMATOLOGIC:  He denies any history of bleeding disorders or easy bleeding. PSYCHIATRIC:  Negative.  MEDICATIONS PRIOR TO ADMISSION: 1. Altace 5 mg b.i.d. 2. Lopressor 50 mg b.i.d. 3. Lipitor 10 mg q.d. 4. Aspirin one q.d. 5. Isosorbide 30 mg q.d. 6. Niaspan 500 mg q.d. 7. Sublingual nitroglycerin p.r.n.  FAMILY HISTORY:  Positive for coronary disease on his fathers side. His father did not have coronary disease, but his uncles did and some died of heart disease suddenly. He has one brother age 47 without known heart disease but does have hyperlipidemia.  SOCIAL HISTORY:  He has been married for 23 years and has  two children. 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 He has not smoked in about three weeks but has had a difficult time in the past quitting smoking. He does drink an occasional beer and cocktails. He does not exercise.  PHYSICAL EXAMINATION:  VITAL SIGNS:  Blood pressure 114/77, pulse 65 and regular, respiratory rate 16 and unlabored.  GENERAL:  He is a large man in no apparent distress.  HEENT:  Normocephalic, atraumatic. The pupils  are equal and reactive to light and accommodation. Extraocular muscles are intact. His throat is clear.  NECK:  Normal carotid pulses bilaterally. There are no bruits. There is no adenopathy or thyromegaly.  CARDIAC:  Regular rate and rhythm with normal S1 and S2. There is no murmur, rub, or gallop.  LUNGS:  Clear.  ABDOMEN:  Active bowel sounds. Soft, mildly obese and nontender. There are no palpable masses or organomegaly.  EXTREMITIES:  Strong palpable radial and ulnar pulses bilaterally. Pedal pulses are palpable bilaterally. There is a surgical scar in the right antecubital fossa from a vein harvest for his mastoid surgery.  NEUROLOGICAL:  Alert and oriented x3. Motor and sensory exams are grossly normal.  IMPRESSION:  The patient has a left main and three-vessel coronary disease with a positive stress test. I agree that proceeding with coronary artery bypass graft surgery is the best treatment especially with his left main and ostial left circumflex stenoses. I discussed the operative procedure with he and his wife, and brother, and son, including alternatives, benefits, and risks including bleeding, possible blood transfusion, infection, stroke, myocardial infarction, and death. They understand and would like to proceed with surgery. I also discussed the use of both internal mammary arteries and use of a radial artery, which hopefully will give him the best long-term graft patency. I discussed the possibility of having some paresthesias in his arm and hand related to the arm incision, and these should resolve with time if they occur. We will plan to do a carotid Doppler today as well as check his palmar arches to be sure that we can use the radial arteries. Dictated by:   Alleen Borne, M.D. Attending Physician:  Cleatrice Burke DD:  11/05/01 TD:  11/06/01 Job: [id number] [id number]

## 2011-01-06 NOTE — Cardiovascular Report (Signed)
[place]. [facility]  Patient:    Colin Ryan, Colin Ryan                         MRN: [id number] Proc. Date: 01/25/00 Adm. Date:  [id number] Attending:  Lenise Herald H                        Cardiac Catheterization  PROCEDURES: 1. Left heart catheterization. 2. Coronary angiography. 3. Left ventriculogram. 4. Right coronary artery--proximal.    a. Percutaneous transluminal coronary balloon angioplasty.    b. Placement of intracoronary stent.  COMPLICATIONS:  None.  INDICATIONS:  Colin Ryan is a 60 year old white male with a history of tobacco abuse and positive family history of CAD, who came to the ER at approximately 11 p.m. on January 24, 2000, complaining of one months worth of a stuttering angina.  The patient developed acute onset of substernal chest pain at approximately at 10:15 p.m. prompting an ER visit when he was noted to have inferior ST elevation.  He is now referred for cardiac catheterization.  DESCRIPTION OF PROCEDURE:  After given informed written consent, the patient was brought to the cardiac catheterization lab where his right and left groins were shaved, prepped, and draped in the usual sterile fashion.  ECG monitoring was established.  Using modified Seldinger technique, a #7 French arterial sheath was inserted in the right femoral artery.  Next, a 6 [place] JL4 diagnostic catheter was then used to engage the left main.  This revealed medium sized left main with irregularities up to 20% in the distal portion.  The LAD is a medium sized vessel which coursed to the apex and gave rise to two diagonal branches.  The LAD is noted to be irregular with up to 60% stenotic lesion in its early midportion.  It gives rise to two diagonal branches.  The first diagonal has an early takeoff with an 80% ostial lesion. This is a small vessel.  The second diagonal is a small vessel which bifurcates distally and is diffusely diseased.  The left coronary  artery also gives rise to a medium sized ramus intermedius which has no significant disease.  The left circumflex is a large vessel which coursed in the AV groove and gave rise to one obtuse marginal branch.  The circumflex is noted to give a left to right collaterals to the distal RCA.  The circumflex is noted to be coarsely irregular through its proximal portion in the AV groove.  It gave rise to one large bifurcating obtuse marginal with 20% stenotic lesion in its proximal portion.  As previously stated, the RCA fills via left to right collaterals from the distal circumflex.  The RCA is noted to be totally occluded in its proximal portion.  LEFT VENTRICULOGRAM:  The left ventriculogram reveals preserved EF of 50%.  There is mild posterior basal hypokinesis.  HEMODYNAMICS:  Systemic arterial pressure 115/80, LV systemic pressure 115/12, LVEDP of 26.  INTERVENTIONAL PROCEDURE:  RCA--proximal:  Followed diagnostic angiography, a #7 [place] JR4 guiding catheter with side holes was coaxially engaged in the right coronary ostium and selective coronary angiogram was then performed. Next, a 0.014 Patriot guidewire was advanced out of the guiding catheter into the proximal RCA.  The guidewire was then used to cross the proximal RCA total occlusion and was positioned in the distal posterolateral branch without difficulty.  Next, a Maverick 3.0 x 20 mm balloon was then tracked  over the RCA guidewire and positioned across the proximal RCA total occlusion.  One subsequent inflation to a maximum of 6 atmospheres was then performed for a total of 59 seconds.  Followup angiogram revealed TIMI-3 flow of the distal vessel with persistent irregularities throughout the mid RCA.  The balloon was then advanced down into the midportion of the RCA and one subsequent inflation to a maximum of 8 atmospheres for a total of one minute was then performed. Followup angiogram revealed persistent irregularities  with thrombus and a small area of dissection in the mid RCA with TIMI-3 flow to the distal vessel. At this point this balloon was then removed and a Medtronic S670 3.0 x 30 mm stent was then tracked into the mid RCA stenotic lesion.  This stent was then deployed to a maximum of 16 atmospheres for a total of one minute.  Followup angiogram revealed no evidence of dissection or thrombus with TIMI-3 flow to the distal vessel.  We elected to postdilate this stent to a maximum of 18 atm for an additional 52 seconds.  Followup angiogram revealed no evidence of dissection or thrombus with TIMI-3 flow to the distal vessel.  Because of the complexity of the case, we elected to proceed with ReoPro administration. Intravenous doses of heparin were given to maintain the ACT between 200 and 300.  Final orthogonal angiograms reveal less than 10% residual stenosis in the RCA stenotic lesion with TIMI-3 flow to the distal vessel.  At this point we elected to conclude the procedure.  All balloons, wires and catheters were removed.  Hemostatic sheaths were sewn in place and the patient was transferred to the coronary ICU in stable condition and to continue with ReoPro infusion to complete 12 hours.  CONCLUSIONS: 1. Successful percutaneous transluminal coronary balloon angioplasty and    placement of a Medtronic S670 3.5 x 30 mm coronary stent to the mid    right coronary artery stenotic lesion. 2. Significant disease of the left anterior descending and diagonal    system. 3. Low normal left ventricular systolic function with wall motion abnormality    as noted above. 4. Adjunct use of ReoPro (abciximab) infusion. DD:  01/25/00 TD:  01/27/00 Job: [id number]

## 2011-01-06 NOTE — Discharge Summary (Signed)
[place]  Patient:    Colin Ryan, Colin Ryan                         MRN: [id number] Adm. Date:  [id number] Disc. Date: [id number] Attending:  Ruta Hinds Dictator:   Mancel Bale, P.A. CC:         Oley Balm. Georgina Pillion, M.D.   Discharge Summary  ADMISSION DIAGNOSES: 1. Unstable angina. 2. History of coronary artery disease.    a. Status post acute inferior myocardial infarction, January 25, 2000, with       emergent percutaneous transluminal coronary angioplasty stent to the       proximal right coronary artery.    b. Nonsustained ventricular tachycardia post percutaneous transluminal       coronary angioplasty stent, January 25, 2000. 3. Tobacco use. 4. Hyperlipidemia. 5. Hypertension.  DISCHARGE DIAGNOSES: 1. Unstable angina. 2. History of coronary artery disease.    a. Status post acute inferior myocardial infarction, January 25, 2000, with       emergent percutaneous transluminal coronary angioplasty stent to the       proximal right coronary artery.    b. Nonsustained ventricular tachycardia post percutaneous transluminal       coronary angioplasty stent, January 25, 2000. 3. Tobacco use. 4. Hyperlipidemia. 5. Hypertension. 6. Status post cardiac catheterization on January 30, 2001, by Lenise Herald,    M.D. with percutaneous transluminal coronary angioplasty stent to the    proximal left anterior descending.  He also had residual significant    coronary artery disease at catheterization.  As well he has moderatel    left ventricular dysfunction with ejection fraction of 45 to 50%.  HISTORY OF PRESENT ILLNESS:  Colin Ryan is a 60 year old white married male with a history of CAD, status post acute inferior MI in January 25, 2000, with rescue PTCA stent to the proximal RCA.  As well he had nonsustained ventricular tachycardia post stenting procedure.  He had done well until last week at which time he had episodes of chest pain lasting 15  seconds occasionally during the day.  However, on the day of January 30, 2001, he was showering when he developed midsternal chest pain with right-hand tingling. This lasted about 15 minutes or so and was then relieved with one nitroglycerin.  He went to work and the pain recurred.  He took one nitroglycerin with relief.  He went home and his wife brought him to the emergency room.  In the ER he was pain-free.  EKG was without acute change. He was stable with a blood pressure of 115/72, heart rate 62, oxygen saturation 96% on room air.  No significant abnormalities on examination.  ekg showed no acute change.  Labs were all within normal range for initial laboratories.  At that time he was seen by Dr. Alanda Amass who planned for admission to check serial cardiac enzymes to rule out MI.  He would be placed on aspirin and Plavix, nitroglycerin.  He would be off heparin for possible Replace II study. It was felt that he needed to undergo cardiac catheterization which the patient and family were agreeable with.  HOSPITAL COURSE:  On January 30, 2001, Colin Ryan underwent cardiac catheterization by Dr. Jenne Campus.  He performed successful PTCA stenting to the proximal left anterior descending artery.  There was also a 40 to 50% ostial left main stenosis.  There was mild in-stent restenosis to the mid RCA with  a 60% mid RCA stenotic lesion distal to the stent.  He had mildly depressed LV function with wall motion abnormality.  EF 45 to 50%.  He did use adjunct use of Integrilin infusion during the procedure.  While the patient was planned for Plavix 75 mg a day for 30 days postprocedure.  The patient tolerated the procedure well without complications.  He was placed on Imdur 30 mg a day for his residual CAD.  On the morning of January 31, 2001, Colin Ryan was stable post cardiac catheterization.  At that point he was seen by Dr. Jenne Campus who planned to stop his nitroglycerin, have him ambulate in the  hallway, and plan for discharge home in the afternoon if he was stable.  However, on the afternoon of January 31, 2001, Mr. Cornia developed some chest pain and "soreness in his chest."  He rated it as a 2/10 and stated that it was a nagging soreness that he had had since the procedure.  He was given Darvocet which relieved the pain.  EKG was performed which showed no acute change.  On the morning of February 01, 2001, Colin Ryan has had no further chest pain and feels fine.  He has been ambulating in the hallway without difficulty with his groin or with further chest discomfort.  At this point, he was seen by Dr. Jenne Campus who deemed him stable for discharge home.  He is now afebrile at 97.0, pulse 56, blood pressure 98/54.  Telemetry showed sinus bradycardia at 58 beats per minute with no dysrrhythmia.  His right groin does have significant ecchymosis, but there is no hematoma and no bruit.  He is seen by Dr. Jenne Campus who deemed him stable for discharge home at this point.  CONSULTING PHYSICIANS:  None.  PROCEDURE:  Cardiac catheterization on January 30, 2001, by Dr. Jenne Campus.  Her performed successful PTCA and stenting to the proximal LAD which went from a 90% stenosis to a less than 10% residual.  The left main had a proximal 50% ostial stenosis.  The LAD had a 90% stenosis at the proximal LAD.  The first diagonal was a small vessel with an 80% ostial lesion.  Second diagonal showed no significant disease.  The left circumflex in the AV groove area had a 50% ostial lesion, but no high grade stenosis.  The first OM was a large vessel and had a 30% midvessel stenotic lesion.  The RCA was dominant and gave rise to PDA and PLA.  There was a stent in the mid RCA in the area of the Shepherds crook.  There was a mild 30 to 40% irregularity throughout the mid RCA stent and a 60% stenotic lesion distal to the stent which appeared to be mildly hypodense.  There was a large PDA and PLA with no significant  disease. LV-gram showed EF 45 to 50% with mild anterolateral hypokinesis.   Again, Dr. Jenne Campus then performed successful PTCA stenting to the proximal LAD stenosis and this went from a 90% to less than 10% residual.  He used adjunct Integrilin infusion which was continued 18 hours after the procedure.  As well Plavix was used which was continued after procedure.  EKG on admission showed normal sinus rhythm with nonspecific ST T change with no acute or ischemic change.  RADIOLOGY:  Chest x-ray on January 30, 2001, shows no acute disease.  LABORATORY DATA:  Lipid profile shows cholesterol 166, triglycerides 131, HDL 30, and LDL 110.  On January 30, 2001, metabolic profile  showed sodium 133, potassium 3.9, chloride 104, CO2 25, glucose 97, BUN 12, creatinine 0.9. Liver function tests normal.  On January 30, 2001, WBC 6.8, hemoglobin 13.6, hematocrit 39.0, platelets 216.  On January 30, 2001, PT 12.8, INR 1.0, PTT 34. On January 31, 2001, WBC 5.7, hemoglobin 13.7, hematocrit 40.0, and platelets 247. On January 31, 2001, sodium 137, potassium 4.3, chloride 102, CO2 27, glucose 104, BUN 13, creatinine 1.0.  Cardiac enzymes were negative with CK 175, 134, and 147; MB 4.2, 3.0, and 4.4; troponin 0.02, 0.03, and 0.14.  DISCHARGE MEDICATIONS: 1. Plavix 75 mg once a day for one month. 2. Lopressor 15 mg twice a day. 3. Enteric-coated aspirin 325 mg once a day. 4. Lipitor 10 mg once a day. 5. Altace 2.5 twice a day. 6. Imdur 30 mg once a day. 7. Nitroglycerin 0.4 mg sublingual as directed. 8. Niospan 500 mg one each night.  He was instructed to take an aspirin 30 minutes prior to the Niospan and to take Niospan with a low fat snack.  ACTIVITY:  No strenuous activity, lifting, or driving for four days.  Call the office at [phone] if any bleeding or increased size or pain of the groin.  FOLLOW-UP:  He has an appointment to follow up with Dr. Jenne Campus on June 28, at 2:30 p.m.  Please note that this  discharge had already been dictated by [facility] A. Delanna Ahmadi, R.N., N.P. and it was [phone], but this is a new dictation to take its place as the patient stayed in the hospital another day than what she had dictated. This is to replace her dictation. DD:  02/01/01 TD:  02/01/01 Job: [id number]

## 2011-02-19 HISTORY — PX: JOINT REPLACEMENT: SHX530

## 2011-03-06 ENCOUNTER — Other Ambulatory Visit (HOSPITAL_COMMUNITY): Payer: BC Managed Care – PPO

## 2011-03-07 ENCOUNTER — Other Ambulatory Visit: Payer: Self-pay | Admitting: Orthopedic Surgery

## 2011-03-07 ENCOUNTER — Ambulatory Visit (HOSPITAL_COMMUNITY)
Admission: RE | Admit: 2011-03-07 | Discharge: 2011-03-07 | Disposition: A | Discharge status: Home / Self Care | Payer: BC Managed Care – PPO | Source: Ambulatory Visit | Attending: Orthopedic Surgery | Admitting: Orthopedic Surgery

## 2011-03-07 ENCOUNTER — Other Ambulatory Visit (HOSPITAL_COMMUNITY): Payer: Self-pay | Admitting: Orthopedic Surgery

## 2011-03-07 ENCOUNTER — Encounter (HOSPITAL_COMMUNITY): Payer: BC Managed Care – PPO

## 2011-03-07 DIAGNOSIS — Z01812 Encounter for preprocedural laboratory examination: Secondary | ICD-10-CM | POA: Insufficient documentation

## 2011-03-07 DIAGNOSIS — Z01818 Encounter for other preprocedural examination: Secondary | ICD-10-CM

## 2011-03-07 DIAGNOSIS — Z01811 Encounter for preprocedural respiratory examination: Secondary | ICD-10-CM | POA: Insufficient documentation

## 2011-03-07 DIAGNOSIS — I10 Essential (primary) hypertension: Secondary | ICD-10-CM | POA: Insufficient documentation

## 2011-03-07 DIAGNOSIS — M1710 Unilateral primary osteoarthritis, unspecified knee: Secondary | ICD-10-CM | POA: Insufficient documentation

## 2011-03-07 DIAGNOSIS — Z951 Presence of aortocoronary bypass graft: Secondary | ICD-10-CM | POA: Insufficient documentation

## 2011-03-07 LAB — CBC
HCT: 45.1 % (ref 39.0–52.0)
Hemoglobin: 15.5 g/dL (ref 13.0–17.0)
MCHC: 34.4 g/dL (ref 30.0–36.0)
Platelets: 230 10*3/uL (ref 150–400)
RDW: 12.9 % (ref 11.5–15.5)
WBC: 8.7 10*3/uL (ref 4.0–10.5)

## 2011-03-07 LAB — COMPREHENSIVE METABOLIC PANEL
ALT: 23 U/L (ref 0–53)
AST: 19 U/L (ref 0–37)
Albumin: 4.3 g/dL (ref 3.5–5.2)
BUN: 17 mg/dL (ref 6–23)
CO2: 28 mEq/L (ref 19–32)
Calcium: 9.8 mg/dL (ref 8.4–10.5)
Chloride: 102 mEq/L (ref 96–112)
Creatinine, Ser: 1.04 mg/dL (ref 0.50–1.35)
GFR calc Af Amer: >60 mL/min (ref >60)
GFR calc non Af Amer: >60 mL/min (ref >60)
Glucose, Bld: 98 mg/dL (ref 70–99)
Sodium: 137 mEq/L (ref 135–145)
Total Bilirubin: 0.7 mg/dL (ref 0.3–1.2)
Total Protein: 7.7 g/dL (ref 6.0–8.3)

## 2011-03-07 LAB — URINALYSIS, ROUTINE W REFLEX MICROSCOPIC
Bilirubin Urine: NEGATIVE
Glucose, UA: NEGATIVE mg/dL
Hgb urine dipstick: NEGATIVE
Ketones, ur: NEGATIVE mg/dL
Leukocytes, UA: NEGATIVE
Nitrite: NEGATIVE
Protein, ur: NEGATIVE mg/dL
Specific Gravity, Urine: 1.023 (ref 1.005–1.030)
Urobilinogen, UA: 0.2 mg/dL (ref 0.0–1.0)
pH: 5.5 (ref 5.0–8.0)

## 2011-03-07 LAB — SURGICAL PCR SCREEN
MRSA, PCR: NEGATIVE
Staphylococcus aureus: NEGATIVE

## 2011-03-07 LAB — PROTIME-INR
INR: 1.09 (ref 0.00–1.49)
Prothrombin Time: 14.3 seconds (ref 11.6–15.2)

## 2011-03-07 LAB — APTT: aPTT: 31 seconds (ref 24–37)

## 2011-03-13 ENCOUNTER — Inpatient Hospital Stay (HOSPITAL_COMMUNITY)
Admission: RE | Admit: 2011-03-13 | Discharge: 2011-03-16 | DRG: 209 | Disposition: A | Discharge status: Home Health | Payer: BC Managed Care – PPO | Source: Ambulatory Visit | Attending: Orthopedic Surgery | Admitting: Orthopedic Surgery

## 2011-03-13 DIAGNOSIS — E785 Hyperlipidemia, unspecified: Secondary | ICD-10-CM | POA: Diagnosis present

## 2011-03-13 DIAGNOSIS — I10 Essential (primary) hypertension: Secondary | ICD-10-CM | POA: Diagnosis present

## 2011-03-13 DIAGNOSIS — Z01812 Encounter for preprocedural laboratory examination: Secondary | ICD-10-CM

## 2011-03-13 DIAGNOSIS — E871 Hypo-osmolality and hyponatremia: Secondary | ICD-10-CM | POA: Diagnosis not present

## 2011-03-13 DIAGNOSIS — E875 Hyperkalemia: Secondary | ICD-10-CM | POA: Diagnosis not present

## 2011-03-13 DIAGNOSIS — M1710 Unilateral primary osteoarthritis, unspecified knee: Principal | ICD-10-CM | POA: Diagnosis present

## 2011-03-13 DIAGNOSIS — I2510 Atherosclerotic heart disease of native coronary artery without angina pectoris: Secondary | ICD-10-CM | POA: Diagnosis present

## 2011-03-13 DIAGNOSIS — I252 Old myocardial infarction: Secondary | ICD-10-CM

## 2011-03-13 DIAGNOSIS — Z01818 Encounter for other preprocedural examination: Secondary | ICD-10-CM

## 2011-03-13 DIAGNOSIS — Z951 Presence of aortocoronary bypass graft: Secondary | ICD-10-CM

## 2011-03-13 LAB — TYPE AND SCREEN: ABO/RH(D): O POS

## 2011-03-13 LAB — ABO/RH: ABO/RH(D): O POS

## 2011-03-13 NOTE — Op Note (Signed)
NAME:  Colin Ryan, CELMER NO.:  [id number]  MEDICAL RECORD NO.:  [id number]  LOCATION:  X005                         FACILITY:  [facility]  PHYSICIAN:  Ollen Gross, M.D.    DATE OF BIRTH:  Feb 14, 1951  DATE OF PROCEDURE:  03/13/2011 DATE OF DISCHARGE:                              OPERATIVE REPORT   PREOPERATIVE DIAGNOSIS:  Osteoarthritis, bilateral knees.  POSTOPERATIVE DIAGNOSIS:  Osteoarthritis, bilateral knees.  PROCEDURE: 1. Left total knee arthroplasty. 2. Right knee cortisone injection.  SURGEON:  Ollen Gross, M.D.  ASSISTANT:  Alexzandrew L. Perkins, P.A.C.  ANESTHESIA:  General.  ESTIMATED BLOOD LOSS:  Minimal.  DRAINS:  Hemovac x1.  TOURNIQUET TIME:  40 minutes at 300 mmHg.  COMPLICATIONS:  None.  CONDITION:  Stable to recovery.  BRIEF CLINICAL NOTE:  Mr. Ambrosius is a 60 year old male with advanced end- stage arthritis of both knees, left more symptomatic than the right.  He has failed nonoperative management, presents now for a left total knee arthroplasty and right knee cortisone injection.  PROCEDURE IN DETAIL:  After successful administration of general anesthetic, a tourniquet placed on the left thigh and his left lower extremity was prepped and draped in usual sterile fashion.  Extremities wrapped in Esmarch, knee flexed, tourniquet inflated to 300 mmHg. Midline incision was made with a 10 blade through subcutaneous tissue to the level of the extensor mechanism.  A fresh blade is used make a medial parapatellar arthrotomy.  Soft tissue on the proximal medial tibia subperiosteally elevated to the joint line with the knife and into the semimembranosus bursa with a Cobb elevator.  Soft tissue laterally is elevated with attention being paid to avoiding the patellar tendon on tibial tubercle.  Patella was everted, knee flexed to 90 degrees, ACL and PCL removed.  Drill was used create a starting hole in the distal femur and the canal was  thoroughly irrigated.  The 5-degree left valgus alignment guide is placed.  Distal femoral cutting block is pinned to remove 11 mm off the distal femur.  Distal femoral resection is made with an oscillating saw.  The tibia was then subluxed forward and the menisci removed. Extramedullary tibial alignment guide is placed referencing proximally at the medial aspect of the tibial tubercle and distally along the second metatarsal axis and tibial crest.  Blocks pinned to remove 2 mm off the more deficient medial side.  Tibial resection is made with an oscillating saw.  Size 4 is the most appropriate tibial component and the proximal tibia is prepared to modular drill and keel punch for the size 4.  Femoral sizing guide is placed, size 4 is most appropriate on the femur. Rotations marked off the epicondylar axis confirmed by creating rectangular flexion gap at 90 degrees.  Block is pinned in this rotation and anterior-posterior chamfer cuts made.  Intercondylar block placed and that cut was made.  The trial size 4 posterior stabilized femur was placed.  12.5 mm posterior stabilized rotating platform insert trial was placed.  With a 12.5, full extension was achieved.  With excellent varus- valgus and anterior-posterior balance throughout, full range of motion. Patella was everted, thickness measured to be 25  mm.  Freehand resection taken to 15 mm, 38 template is placed, lug holes drilled, trial patella was placed and it tracks normally.  Osteophytes removed off the posterior femur with the trial in place.  All trials were removed and the cut bone surfaces are prepared with pulsatile lavage.  Cement was mixed and once ready for implantation, the size 4 mobile bearing tibial tray, size 4 posterior stabilized femur and 38 patella were cemented in place.  Patella was held with a clamp.  Trial 12.5-mm inserts placed, knee held in full extension and extruded cement removed.  When cement is fully  hardened, then the permanent 12.5-mm posterior stabilized rotating platform insert is placed in the tibial tray.  The wounds copiously irrigated with saline solution and the arthrotomy closed over Hemovac drain with interrupted #1 PDS.  Flexion against gravity to 135 degrees. Patella tracks normally.  Tourniquet release total time of 40 minutes. Subcu was closed interrupted 2-0 Vicryl and subcuticular running 4-0 Monocryl.  Catheter for Marcaine pain pump was placed and pumps initiated.  Incisions cleaned and dried and Steri-Strips and bulky sterile dressing are applied.  He is then placed into knee immobilizer. I then prepped the right knee and did an injection with 5 mL of 1% lidocaine, 80 mg of Depo-Medrol with no problems.  The area is cleaned and Band-Aid is placed.  The patient is subsequently awakened and transported to recovery in stable condition.     Ollen Gross, M.D.     FA/MEDQ  D:  03/13/2011  T:  03/13/2011  Job:  [id number]  Electronically Signed by Ollen Gross M.D. on 03/13/2011 04:42:47 PM

## 2011-03-14 LAB — CBC
HCT: 41.2 % (ref 39.0–52.0)
Hemoglobin: 13.3 g/dL (ref 13.0–17.0)
MCH: 30.2 pg (ref 26.0–34.0)
MCHC: 32.3 g/dL (ref 30.0–36.0)
MCV: 93.4 fL (ref 78.0–100.0)
Platelets: 248 10*3/uL (ref 150–400)
RBC: 4.41 MIL/uL (ref 4.22–5.81)
RDW: 12.9 % (ref 11.5–15.5)
WBC: 13.1 10*3/uL — ABNORMAL HIGH (ref 4.0–10.5)

## 2011-03-14 LAB — BASIC METABOLIC PANEL
BUN: 27 mg/dL — ABNORMAL HIGH (ref 6–23)
CO2: 27 mEq/L (ref 19–32)
Calcium: 8.8 mg/dL (ref 8.4–10.5)
Creatinine, Ser: 1.34 mg/dL (ref 0.50–1.35)
GFR calc Af Amer: >60 mL/min (ref >60)
GFR calc non Af Amer: 54 mL/min — ABNORMAL LOW (ref >60)
Glucose, Bld: 186 mg/dL — ABNORMAL HIGH (ref 70–99)
Potassium: 5.3 mEq/L — ABNORMAL HIGH (ref 3.5–5.1)
Sodium: 134 mEq/L — ABNORMAL LOW (ref 135–145)

## 2011-03-15 LAB — CBC
Hemoglobin: 11.2 g/dL — ABNORMAL LOW (ref 13.0–17.0)
MCH: 30.4 pg (ref 26.0–34.0)
MCHC: 32.2 g/dL (ref 30.0–36.0)
MCV: 94.3 fL (ref 78.0–100.0)
Platelets: 192 10*3/uL (ref 150–400)
RBC: 3.69 MIL/uL — ABNORMAL LOW (ref 4.22–5.81)
WBC: 12.2 10*3/uL — ABNORMAL HIGH (ref 4.0–10.5)

## 2011-03-15 LAB — BASIC METABOLIC PANEL
BUN: 22 mg/dL (ref 6–23)
CO2: 27 mEq/L (ref 19–32)
Calcium: 8.7 mg/dL (ref 8.4–10.5)
Creatinine, Ser: 0.92 mg/dL (ref 0.50–1.35)
GFR calc Af Amer: >60 mL/min (ref >60)
GFR calc non Af Amer: >60 mL/min (ref >60)
Potassium: 4.6 mEq/L (ref 3.5–5.1)
Sodium: 136 mEq/L (ref 135–145)

## 2011-03-16 LAB — CBC
HCT: 33 % — ABNORMAL LOW (ref 39.0–52.0)
Hemoglobin: 10.6 g/dL — ABNORMAL LOW (ref 13.0–17.0)
MCH: 30.1 pg (ref 26.0–34.0)
MCHC: 32.1 g/dL (ref 30.0–36.0)
MCV: 93.8 fL (ref 78.0–100.0)
Platelets: 193 10*3/uL (ref 150–400)
RBC: 3.52 MIL/uL — ABNORMAL LOW (ref 4.22–5.81)
RDW: 13.1 % (ref 11.5–15.5)
WBC: 9.9 10*3/uL (ref 4.0–10.5)

## 2011-04-03 NOTE — H&P (Signed)
NAME:  Colin Ryan, Colin Ryan NO.:  [id number]  MEDICAL RECORD NO.:  [id number]  LOCATION:  1613                         FACILITY:  [facility]  PHYSICIAN:  Ollen Gross, M.D.    DATE OF BIRTH:  1951/04/04  DATE OF ADMISSION:  03/13/2011 DATE OF DISCHARGE:                             HISTORY & PHYSICAL   CHIEF COMPLAINT:  Left greater than right knee pain.  HISTORY OF PRESENT ILLNESS:  The patient is a 60 year old male who has been seen by Dr. Lequita Halt for ongoing bilateral knee pain.  He has known end-stage arthritis, progressively getting worse.  It is felt that he would benefit from undergoing surgical intervention.  Risks and benefits have been discussed.  He elects to proceed with surgery.  ALLERGIES:  NO KNOWN DRUG ALLERGIES.  CURRENT MEDICATIONS: 1. Toprol XL. 2. Crestor. 3. Ramipril. 4. Baby aspirin. 5. BC powders.  PAST MEDICAL HISTORY: 1. Hypertension. 2. Coronary arterial disease. 3. History of MI, 2001. 4. Hyperlipidemia. 5. Status post CABG of three vessels and cardiac catheterization with     heart stenting.  PAST SURGICAL HISTORY:  He has mastoid surgery.  He has also had cardiac catheterization after MI, stenting in 2001, and also has had three- vessel bypass surgery in 2003.  FAMILY HISTORY:  Father with Alzheimer's.  Mother with liver cancer.  SOCIAL HISTORY:  Married.  He is a Teacher, music.  Past smoker.  No alcohol.  He does have a caregiver loan lined up after surgery.  REVIEW OF SYSTEMS:  GENERAL:  No fevers, chills, or night sweats. NEURO:  No seizures, syncope, or paralysis.  RESPIRATORY:  No shortness breath, productive cough, or hemoptysis.  CARDIOVASCULAR:  No chest pain or orthopnea.  GI:  No nausea, vomiting, diarrhea, or constipation.  GU: No dysuria, hematuria, or discharge.  MUSCULOSKELETAL:  Left greater than right knee pain.  PHYSICAL EXAMINATION:  VITAL SIGNS:  Pulse 64, respirations 12, blood pressure 148/76. GENERAL:   This 60 year old white male, well-nourished, well-developed, no acute distress.  He is alert, oriented, and cooperative, good historian, slightly overweight. HEENT:  Normocephalic, atraumatic.  Pupils rounds and reactive.  EOMs intact.  No reading glasses. NECK:  Supple. CHEST:  Clear anteriorly and posteriorly. HEART:  Regular rate and rhythm without murmur.  S1 and S2. ABDOMEN:  Soft, nontender.  Bowel sounds present. RECTAL:  Not done, not pertinent to present illness. BREASTS:  Not done, not pertinent to present illness. GENITALIA:  Not done, not pertinent to present illness. EXTREMITIES:  Left knee slight varus range of motion 5 to 125.  Marked crepitus.  Tender, more medial and lateral.  Right knee range of motion 5 to 125, marked crepitus.  Tender, more medial and lateral.  No instability.  IMPRESSION:  Osteoarthritis, left knee greater than right knee.  PLAN:  The patient will be admitted to [facility], undergo a left total knee replacement arthroplasty.  Surgery will be performed by Dr. Ollen Gross.     Alexzandrew L. Julien Girt, P.A.C.   ______________________________ Ollen Gross, M.D.    ALP/MEDQ  D:  03/16/2011  T:  03/16/2011  Job:  [id number]  cc:   Sandria Senter  Lynnea Ferrier, MD Fax: [phone]  Deboraha Sprang at [facility]  Electronically Signed by Patrica Duel P.A.C. on 03/23/2011 09:33:04 AM Electronically Signed by Ollen Gross M.D. on 04/03/2011 11:37:56 AM

## 2011-04-03 NOTE — Discharge Summary (Signed)
NAME:  Colin Ryan, Colin Ryan NO.:  [id number]  MEDICAL RECORD NO.:  [id number]  LOCATION:  1613                         FACILITY:  [facility]  PHYSICIAN:  Ollen Gross, M.D.    DATE OF BIRTH:  [birth year]  DATE OF ADMISSION:  03/13/2011 DATE OF DISCHARGE:  03/16/2011                              DISCHARGE SUMMARY   ADMITTING DIAGNOSES: 1. Osteoarthritis, left knee. 2. Hypertension. 3. Coronary arterial disease. 4. Hypercholesterolemia. 5. Status post coronary artery bypass grafting. 6. Cardiac catheterization with stenting.  DISCHARGE DIAGNOSES: 1. Osteoarthritis, left knee, status post left total knee replacement     arthroplasty. 2. Osteoarthritis, right knee, status post right knee cortisone     injection. 3. Postoperative hyponatremia, improved. 4. Postoperative transient hyperkalemia (likely due to hemolysis with     resolution). 5. Hypertension. 6. Coronary arterial disease. 7. Hypercholesterolemia. 8. Status post coronary artery bypass grafting. 9. Cardiac catheterization with stenting.  PROCEDURE:  March 13, 2011, left total knee with right knee cortisone injection.  Surgeon, Dr. Lequita Halt.  Assistant, Alexzandrew L. Perkins, P.A.C.  Anesthesia was general.  Tourniquet time, 40 minutes.  CONSULTS:  None.  BRIEF HISTORY:  The patient is a 60 year old male with end-stage arthritis of both knees, left knee is more symptomatic than right, failed nonoperative management, now presents for left total knee arthroplasty with right knee cortisone ejection  LABORATORY DATA:  CBC on admission, hemoglobin of 15.5, hematocrit 45.1, white cell count 8.7, platelets 230.  PT/INR of 14.3 and 1.09 with PTT of 31.  Chem panel on admission all within normal limits.  Preop UA negative.  Blood group type O+.  Nasal swabs were negative for staph aureus and negative for MRSA.  Serial CBCs were followed.  Hemoglobin dropped down to 13.3 and 11.2.  Last noted H and H of 10.6 and  33.0. Serial BMETs were followed for 48 hours.  Sodium did drop from 137 to 134, back up to 136.  Potassium was normal, came in at 4.6, went up to 5.3 likely due to hemolysis because the very next day was back down to normal again at 4.6.  Remaining electrolytes remained within normal limits.  EKG dated December 19, 2010, bradycardic rhythm at 56, otherwise, normal, confirmed by Dr. Lynnea Ferrier.  HOSPITAL COURSE:  The patient admitted to [facility], taken to OR, underwent above-stated procedure without complications.  The patient tolerated the procedure well, later transferred to the recovery room on orthopedic floor.  Did have fair amount of pain through the night but doing better on the morning of day #1.  We started him on p.o. and IV analgesics.  His potassium was a little elevated at 5.3 and felt to be due to hemolysis.  His potassium was normal, and so we just rechecked that.  He had good urine output, was started getting out of bed with therapy.  He actually did extremely well with his therapy walking over 100 feet on day #1.  By day #2, he was doing well, potassium was back to normal level of 4.6, it was likely due to just his hemolysis.  We took his Foley out, changed his dressing.  The incision  looked good.  We started getting him up little more on day #2 and used incentive spirometer.  He continued to progress well and by day #3, he was doing well.  His hemoglobin was stable.  He was tolerating his meds, he was discharged home.  DISCHARGE/PLAN: 1. The patient discharged home on March 16, 2011. 2. Discharge diagnoses, please see above. 3. Discharge meds:  OxyIR, Xarelto, Robaxin.  DIET:  Heart healthy diet.  ACTIVITY:  Weightbearing as tolerated.  Home health PT.  FOLLOW-UP:  Two weeks.  DISPOSITION:  Home.  CONDITION ON DISCHARGE:  Improved.     Alexzandrew L. Julien Girt, P.A.C.   ______________________________ Ollen Gross, M.D.    ALP/MEDQ  D:   03/16/2011  T:  03/16/2011  Job:  [id number]  cc:   Ritta Slot, MD Fax: [phone]  Dr. Alita Chyle  Electronically Signed by Patrica Duel P.A.C. on 03/30/2011 07:41:20 AM Electronically Signed by Ollen Gross M.D. on 04/03/2011 11:38:03 AM

## 2011-09-24 ENCOUNTER — Emergency Department (HOSPITAL_COMMUNITY)
Admission: EM | Admit: 2011-09-24 | Discharge: 2011-09-24 | Disposition: A | Discharge status: Home / Self Care | Payer: No Typology Code available for payment source | Attending: Emergency Medicine | Admitting: Emergency Medicine

## 2011-09-24 ENCOUNTER — Emergency Department (HOSPITAL_COMMUNITY): Payer: No Typology Code available for payment source

## 2011-09-24 ENCOUNTER — Encounter (HOSPITAL_COMMUNITY): Payer: Self-pay | Admitting: Emergency Medicine

## 2011-09-24 DIAGNOSIS — R51 Headache: Secondary | ICD-10-CM | POA: Insufficient documentation

## 2011-09-24 DIAGNOSIS — S1093XA Contusion of unspecified part of neck, initial encounter: Secondary | ICD-10-CM | POA: Insufficient documentation

## 2011-09-24 DIAGNOSIS — Z7982 Long term (current) use of aspirin: Secondary | ICD-10-CM | POA: Insufficient documentation

## 2011-09-24 DIAGNOSIS — IMO0002 Reserved for concepts with insufficient information to code with codable children: Secondary | ICD-10-CM | POA: Insufficient documentation

## 2011-09-24 DIAGNOSIS — S0003XA Contusion of scalp, initial encounter: Secondary | ICD-10-CM | POA: Insufficient documentation

## 2011-09-24 DIAGNOSIS — Z951 Presence of aortocoronary bypass graft: Secondary | ICD-10-CM | POA: Insufficient documentation

## 2011-09-24 DIAGNOSIS — I10 Essential (primary) hypertension: Secondary | ICD-10-CM | POA: Insufficient documentation

## 2011-09-24 DIAGNOSIS — Z79899 Other long term (current) drug therapy: Secondary | ICD-10-CM | POA: Insufficient documentation

## 2011-09-24 DIAGNOSIS — R42 Dizziness and giddiness: Secondary | ICD-10-CM | POA: Insufficient documentation

## 2011-09-24 DIAGNOSIS — I252 Old myocardial infarction: Secondary | ICD-10-CM | POA: Insufficient documentation

## 2011-09-24 HISTORY — DX: Essential (primary) hypertension: I10

## 2011-09-24 MED ORDER — MECLIZINE HCL 25 MG PO TABS: 25.0000 mg | ORAL_TABLET | Freq: Three times a day (TID) | ORAL | Status: AC | PRN

## 2011-09-24 MED ORDER — MECLIZINE HCL 25 MG PO TABS
25.0000 mg | ORAL_TABLET | Freq: Once | ORAL | Status: AC
  Administered 2011-09-24: 25 mg via ORAL
  Filled 2011-09-24: qty 1

## 2011-09-24 MED ORDER — ONDANSETRON 4 MG PO TBDP
ORAL_TABLET | ORAL | Status: AC
  Administered 2011-09-24: 8 mg via ORAL
  Filled 2011-09-24: qty 2

## 2011-09-24 MED ORDER — ONDANSETRON 4 MG PO TBDP
8.0000 mg | ORAL_TABLET | Freq: Once | ORAL | Status: AC
  Administered 2011-09-24: 8 mg via ORAL
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
## 2011-09-24 NOTE — Discharge Instructions (Signed)
Benign Positional Vertigo Vertigo is a feeling that you are unsteady or that you or your surroundings are moving. Benign positional vertigo (BPV) is the most common form of vertigo. Benign means it does not have a serious cause. BPV is an upset in the balance system in your middle ear. This is troublesome but usually not serious. A viral infection or head injury are common causes, but often no cause is found. It is more common as we grow older. SYMPTOMS   Sudden dizziness happens when you move your head in different directions. Some of the problems that come with this are:  Loss of balance.     Throwing up (vomiting).     Blurred vision.     Dizziness .     Feeling sick to your stomach (nauseated).  DIAGNOSIS   Your caregiver may do some specialized testing to prove what is wrong. HOME CARE INSTRUCTIONS    Rest and eat a well-balanced diet.     Move slowly and do not make sudden body or head movements.     Do not drive a car or do any activities that could hurt you or others.     Lie down and rest. Take precautions to prevent falls.  SEEK IMMEDIATE MEDICAL CARE IF:    You develop headaches which are severe or lasting.     You develop continued vomiting.     A temporary loss or change of vision appears.     You notice temporary numbness on one side of your body.     You are temporarily unable to speak.     Temporary areas of weakness develop.     You have weakness or numbness in the face, arms, or legs.     You notice dizziness or difficulty walking.     You experience slurred speech or difficulty swallowing.  MAKE SURE YOU:    Understand these instructions.     Will watch your condition.     Will get help right away if you are not doing well or get worse.  Document Released: 05/15/2006 Document Revised: 02/20/2011 Document Reviewed: 05/24/2006 [employer] Patient Information 2012 [employer]. 

## 2011-09-26 ENCOUNTER — Other Ambulatory Visit: Payer: Self-pay | Admitting: Orthopedic Surgery

## 2011-10-26 ENCOUNTER — Encounter (HOSPITAL_COMMUNITY): Payer: Self-pay | Admitting: Pharmacy Technician

## 2011-10-30 ENCOUNTER — Encounter (HOSPITAL_COMMUNITY)
Admission: RE | Admit: 2011-10-30 | Discharge: 2011-10-30 | Disposition: A | Discharge status: Home / Self Care | Payer: BC Managed Care – PPO | Source: Ambulatory Visit | Attending: Orthopedic Surgery | Admitting: Orthopedic Surgery

## 2011-10-30 ENCOUNTER — Encounter (HOSPITAL_COMMUNITY): Payer: Self-pay

## 2011-10-30 HISTORY — DX: Unspecified osteoarthritis, unspecified site: M19.90

## 2011-10-30 LAB — COMPREHENSIVE METABOLIC PANEL
ALT: 24 U/L (ref 0–53)
CO2: 26 mEq/L (ref 19–32)
Calcium: 9.5 mg/dL (ref 8.4–10.5)
Creatinine, Ser: 0.93 mg/dL (ref 0.50–1.35)
GFR calc Af Amer: >90 mL/min (ref >90)
GFR calc non Af Amer: 89 mL/min — ABNORMAL LOW (ref >90)
Glucose, Bld: 101 mg/dL — ABNORMAL HIGH (ref 70–99)
Sodium: 137 mEq/L (ref 135–145)

## 2011-10-30 LAB — CBC
HCT: 46.4 % (ref 39.0–52.0)
Hemoglobin: 15.8 g/dL (ref 13.0–17.0)
MCH: 31.3 pg (ref 26.0–34.0)
MCV: 91.9 fL (ref 78.0–100.0)
RBC: 5.05 MIL/uL (ref 4.22–5.81)

## 2011-10-30 LAB — URINALYSIS, ROUTINE W REFLEX MICROSCOPIC
Glucose, UA: NEGATIVE mg/dL
Ketones, ur: NEGATIVE mg/dL
Leukocytes, UA: NEGATIVE
Protein, ur: NEGATIVE mg/dL

## 2011-10-30 MED ORDER — CHLORHEXIDINE GLUCONATE 4 % EX LIQD
60.0000 mL | Freq: Once | CUTANEOUS | Status: DC
  Filled 2011-10-30: qty 60

## 2011-10-30 NOTE — Pre-Procedure Instructions (Signed)
Faxed STOP BANG SCREENING TOOL to [facility] with note was a pt with Dr Georgina Pillion at one time- has seen a doctor there since his departure with confirmation received

## 2011-10-30 NOTE — Progress Notes (Signed)
10/30/11 1210  OBSTRUCTIVE SLEEP APNEA  Do you snore loudly (loud enough to be heard through closed doors)?  0  Do you often feel tired, fatigued, or sleepy during the daytime? 0  Has anyone observed you stop breathing during your sleep? 0  Do you have, or are you being treated for high blood pressure? 1  BMI more than 35 kg/m2? 1  Age over 62 years old? 1  Neck circumference greater than 40 cm/18 inches? 0  Gender: 1  Obstructive Sleep Apnea Score 4   Score 4 or greater  Updated health history

## 2011-10-30 NOTE — Patient Instructions (Signed)
20 Colin Ryan  10/30/2011   Your procedure is scheduled on:  11/06/11   Surgery 1355-1500    Monday  Report to [facility] at   1125    AM.  Call this number if you have problems the morning of surgery: [phone]     Or PST   [id number]  [facility]   Remember:   Do not eat food:After Midnight. Sunday Night  May have clear liquids: until  0525 Monday AM      THEN NONE  Clear liquids include soda, tea, black coffee, apple or grape juice, broth.  Take these medicines the morning of surgery with A SIP OF WATER:   Vicodin   With sip water   Do not wear jewelry, make-up or nail polish.  Do not wear lotions, powders, or perfumes. You may wear deodorant.  Do not shave 48 hours prior to surgery.  Do not bring valuables to the hospital.  Contacts, dentures or bridgework may not be worn into surgery.  Leave suitcase in the car. After surgery it may be brought to your room.  For patients admitted to the hospital, checkout time is 11:00 AM the day of discharge.   Patients discharged the day of surgery will not be allowed to drive home.  Name and phone number of your driver:    Wife  [name]                                                                  Special Instructions: CHG Shower Use Special Wash: 1/2 bottle night before surgery and 1/2 bottle morning of surgery. REGULAR SOAP FACE AND PRIVATES                         MEN-MAY SHAVE FACE MORNING OF SURGERY  Please read over the following fact sheets that you were given: MRSA Information

## 2011-11-05 ENCOUNTER — Other Ambulatory Visit: Payer: Self-pay | Admitting: Orthopedic Surgery

## 2011-11-05 NOTE — H&P (Signed)
Colin Ryan  DOB: 1951/04/08 Married / Language: English / Race: White / Male  Date of Admission:  11/06/2011  Chief complaint:  Right knee pain  History of Present Illness The patient is a 61 year old male who comes in today for a preoperative History and Physical. The patient is scheduled for a right total knee arthroplasty to be performed by Dr. Gus Ryan. Aluisio, MD at [facility] on 11/06/2011. The patient is a 60 year old male presenting for a post-operative visit. The patient comes in today 6 months out from left total knee arthroplasty. Colin Ryan is here because his left knee is doing great at this time. He states he does not have much pain. He is walking much better. Everyone at work is very impressed. The only problem he is having now is with the right knee. He has known bone on bone medial arthritis in the right. It is getting much worse. He is wondering about going ahead and getting the right knee fixed. They have been treated conservatively in the past for the above stated problem and despite conservative measures, they continue to have progressive pain and severe functional limitations and dysfunction. They have failed non-operative management including home exercise, medications, and injections. It is felt that they would benefit from undergoing total joint replacement. Risks and benefits of the procedure have been discussed with the patient and they elect to proceed with surgery. There are no active contraindications to surgery such as ongoing infection or rapidly progressive neurological disease.  Allergies No Known Drug Allergies  Medication History Ramipril (5MG  Capsule, Oral two times daily) Active. Crestor (10MG  Tablet, Oral daily) Active. Toprol XL (100MG  Tablet ER 24HR, Oral daily) Active. Aspirin EC (81MG  Tablet DR, Oral daily) Active.  Problem List/Past Medical Vertigo Coronary Artery Disease/Heart Disease Myocardial infarction.  2001 Hypercholesterolemia Coronary Artery Bypass Graft Stent. Cardiac  Past Surgical History Coronary Artery Bypass, Three. Date: 2003.  Family History Father. Deceased, Alzheimer's disease. age 27 Mother. Cancer. age 37, Liver cancer  Social History Tobacco use. Former smoker. Alcohol use. Never consumed alcohol. Marital status. Married. Children. 2 Living situation. Lives with spouse. Post-Surgical Plans. Plan to go home  Review of Systems General:Not Present- Chills, Fever, Night Sweats, Fatigue, Weight Gain, Weight Loss and Memory Loss. Skin:Not Present- Hives, Itching, Rash, Eczema and Lesions. HEENT:Not Present- Tinnitus, Headache, Double Vision, Visual Loss, Hearing Loss and Dentures. Respiratory:Not Present- Shortness of breath with exertion, Shortness of breath at rest, Allergies, Coughing up blood and Chronic Cough. Cardiovascular:Not Present- Chest Pain, Racing/skipping heartbeats, Difficulty Breathing Lying Down, Murmur, Swelling and Palpitations. Gastrointestinal:Not Present- Bloody Stool, Heartburn, Abdominal Pain, Vomiting, Nausea, Constipation, Diarrhea, Difficulty Swallowing, Jaundice and Loss of appetitie. Male Genitourinary:Not Present- Urinary frequency, Blood in Urine, Weak urinary stream, Discharge, Flank Pain, Incontinence, Painful Urination, Urgency, Urinary Retention and Urinating at Night. Musculoskeletal:Present- Joint Pain. Not Present- Muscle Weakness, Muscle Pain, Joint Swelling, Back Pain, Morning Stiffness and Spasms. Neurological:Not Present- Tremor, Dizziness, Blackout spells, Paralysis, Difficulty with balance and Weakness. Psychiatric:Not Present- Insomnia.   Vitals Weight: 265 lb Height: 73 in Body Surface Area: 2.49 m Body Mass Index: 34.96 kg/m Pulse: 64 (Regular) Resp.: 16 (Unlabored) BP: 122/66 (Sitting, Right Arm, Standard)    Physical Exam  The physical exam findings are as follows: Patient  is a 61 year old male with continued right knee pain.   General Mental Status - Alert, cooperative and good historian. General Appearance- pleasant. Not in acute distress. Orientation- Oriented X3. Build & Nutrition-  Well nourished and Well developed.   Head and Neck Head- normocephalic, atraumatic . Neck Global Assessment- supple. no bruit auscultated on the right and no bruit auscultated on the left.   Eye Pupil- Bilateral- Regular and Round. Motion- Bilateral- EOMI.   Chest and Lung Exam Auscultation: Breath sounds:- clear at anterior chest wall and - clear at posterior chest wall. Adventitious sounds:- No Adventitious sounds.   Cardiovascular Auscultation:Rhythm- Regular rate and rhythm. Heart Sounds- S1 WNL and S2 WNL. Murmurs & Other Heart Sounds:Auscultation of the heart reveals - No Murmurs.   Abdomen Inspection:Contour- Generalized mild distention. Palpation/Percussion:Tenderness- Abdomen is non-tender to palpation. Rigidity (guarding)- Abdomen is soft. Auscultation:Auscultation of the abdomen reveals - Bowel sounds normal.   Male Genitourinary  Not done, not pertinent to present illness  Musculoskeletal On exam he is alert and oriented. No apparent distress. The left knee looks great. Range of motion is 0-122 degrees. There is no tenderness or instability. The right knee shows a varus deformity. No effusion. Range is 5-120. Moderate crepitus on range of motion with tenderness, medial greater than lateral. No instability. Pulses and motor are intact both lower extremities.  RADIOGRAPHS: AP and lateral of the left knee show the prosthesis to be in excellent position with no periprosthetic abnormalities. I have reviewed the X-rays from last year on the right, he was bone on bone medial and patellofemoral. He has a varus deformity with significant subchondral changes in the tibia.  Assessment & Plan Osteoarthritis  Right  Knee  Patient is for a Right Total Knee Replacement by Dr. Lequita Ryan.  Plan is to go home after the hospital stay.  Cards - Dr. Herbie Ryan - Patient has been seen by Dr. Herbie Ryan and felt to be stable and low to intermediate risk for the upcoming procedure.  Avel Peace, PA-C

## 2011-11-06 ENCOUNTER — Inpatient Hospital Stay (HOSPITAL_COMMUNITY)
Admission: RE | Admit: 2011-11-06 | Discharge: 2011-11-09 | DRG: 209 | Disposition: A | Discharge status: Home Health | Payer: BC Managed Care – PPO | Source: Ambulatory Visit | Attending: Orthopedic Surgery | Admitting: Orthopedic Surgery

## 2011-11-06 ENCOUNTER — Encounter (HOSPITAL_COMMUNITY)
Admission: RE | Disposition: A | Discharge status: Home Health | Payer: Self-pay | Source: Ambulatory Visit | Attending: Orthopedic Surgery

## 2011-11-06 ENCOUNTER — Encounter (HOSPITAL_COMMUNITY): Payer: Self-pay

## 2011-11-06 ENCOUNTER — Ambulatory Visit (HOSPITAL_COMMUNITY): Payer: BC Managed Care – PPO | Admitting: Anesthesiology

## 2011-11-06 ENCOUNTER — Encounter (HOSPITAL_COMMUNITY): Payer: Self-pay | Admitting: Anesthesiology

## 2011-11-06 DIAGNOSIS — I2510 Atherosclerotic heart disease of native coronary artery without angina pectoris: Secondary | ICD-10-CM | POA: Diagnosis present

## 2011-11-06 DIAGNOSIS — Z96659 Presence of unspecified artificial knee joint: Secondary | ICD-10-CM

## 2011-11-06 DIAGNOSIS — I10 Essential (primary) hypertension: Secondary | ICD-10-CM | POA: Diagnosis present

## 2011-11-06 DIAGNOSIS — Z87891 Personal history of nicotine dependence: Secondary | ICD-10-CM

## 2011-11-06 DIAGNOSIS — E875 Hyperkalemia: Secondary | ICD-10-CM | POA: Diagnosis not present

## 2011-11-06 DIAGNOSIS — Z01812 Encounter for preprocedural laboratory examination: Secondary | ICD-10-CM

## 2011-11-06 DIAGNOSIS — M179 Osteoarthritis of knee, unspecified: Secondary | ICD-10-CM | POA: Diagnosis present

## 2011-11-06 DIAGNOSIS — E871 Hypo-osmolality and hyponatremia: Secondary | ICD-10-CM | POA: Diagnosis not present

## 2011-11-06 DIAGNOSIS — I252 Old myocardial infarction: Secondary | ICD-10-CM

## 2011-11-06 DIAGNOSIS — Z951 Presence of aortocoronary bypass graft: Secondary | ICD-10-CM

## 2011-11-06 DIAGNOSIS — E7800 Pure hypercholesterolemia, unspecified: Secondary | ICD-10-CM | POA: Diagnosis present

## 2011-11-06 DIAGNOSIS — M1710 Unilateral primary osteoarthritis, unspecified knee: Secondary | ICD-10-CM | POA: Diagnosis present

## 2011-11-06 HISTORY — PX: TOTAL KNEE ARTHROPLASTY: SHX125

## 2011-11-06 SURGERY — ARTHROPLASTY, KNEE, TOTAL
Anesthesia: General | Site: Knee | Laterality: Right | Wound class: Clean

## 2011-11-06 MED ORDER — NALOXONE HCL 0.4 MG/ML IJ SOLN: 0.4000 mg | INTRAMUSCULAR | Status: DC | PRN

## 2011-11-06 MED ORDER — ACETAMINOPHEN 10 MG/ML IV SOLN
INTRAVENOUS | Status: AC
  Filled 2011-11-06: qty 100

## 2011-11-06 MED ORDER — MENTHOL 3 MG MT LOZG: 1.0000 | LOZENGE | OROMUCOSAL | Status: DC | PRN

## 2011-11-06 MED ORDER — FENTANYL CITRATE 0.05 MG/ML IJ SOLN
INTRAMUSCULAR | Status: DC | PRN
  Administered 2011-11-06 (×3): 50 ug via INTRAVENOUS
  Administered 2011-11-06 (×2): 100 ug via INTRAVENOUS
  Administered 2011-11-06 (×3): 50 ug via INTRAVENOUS

## 2011-11-06 MED ORDER — HYDROMORPHONE HCL PF 1 MG/ML IJ SOLN
INTRAMUSCULAR | Status: AC
  Filled 2011-11-06: qty 1

## 2011-11-06 MED ORDER — PROMETHAZINE HCL 25 MG/ML IJ SOLN: 6.2500 mg | INTRAMUSCULAR | Status: DC | PRN

## 2011-11-06 MED ORDER — DIPHENHYDRAMINE HCL 12.5 MG/5ML PO ELIX
12.5000 mg | ORAL_SOLUTION | Freq: Four times a day (QID) | ORAL | Status: DC | PRN
  Filled 2011-11-06: qty 5

## 2011-11-06 MED ORDER — ONDANSETRON HCL 4 MG/2ML IJ SOLN: 4.0000 mg | Freq: Four times a day (QID) | INTRAMUSCULAR | Status: DC | PRN

## 2011-11-06 MED ORDER — MEPERIDINE HCL 50 MG/ML IJ SOLN: 6.2500 mg | INTRAMUSCULAR | Status: DC | PRN

## 2011-11-06 MED ORDER — ACETAMINOPHEN 650 MG RE SUPP: 650.0000 mg | Freq: Four times a day (QID) | RECTAL | Status: DC | PRN

## 2011-11-06 MED ORDER — SODIUM CHLORIDE 0.9 % IJ SOLN: 9.0000 mL | INTRAMUSCULAR | Status: DC | PRN

## 2011-11-06 MED ORDER — DEXAMETHASONE SODIUM PHOSPHATE 10 MG/ML IJ SOLN
10.0000 mg | Freq: Once | INTRAMUSCULAR | Status: AC
  Administered 2011-11-06: 10 mg via INTRAVENOUS

## 2011-11-06 MED ORDER — ACETAMINOPHEN 10 MG/ML IV SOLN
1000.0000 mg | Freq: Once | INTRAVENOUS | Status: AC
  Administered 2011-11-06: 1000 mg via INTRAVENOUS

## 2011-11-06 MED ORDER — DIPHENHYDRAMINE HCL 50 MG/ML IJ SOLN: 12.5000 mg | Freq: Four times a day (QID) | INTRAMUSCULAR | Status: DC | PRN

## 2011-11-06 MED ORDER — PHENOL 1.4 % MT LIQD: 1.0000 | OROMUCOSAL | Status: DC | PRN

## 2011-11-06 MED ORDER — LACTATED RINGERS IV SOLN
INTRAVENOUS | Status: DC | PRN
  Administered 2011-11-06 (×2): via INTRAVENOUS

## 2011-11-06 MED ORDER — METHOCARBAMOL 100 MG/ML IJ SOLN
500.0000 mg | Freq: Four times a day (QID) | INTRAVENOUS | Status: DC | PRN
  Administered 2011-11-06: 500 mg via INTRAVENOUS
  Filled 2011-11-06: qty 5

## 2011-11-06 MED ORDER — LACTATED RINGERS IV SOLN: INTRAVENOUS | Status: DC

## 2011-11-06 MED ORDER — RAMIPRIL 5 MG PO CAPS
5.0000 mg | ORAL_CAPSULE | Freq: Two times a day (BID) | ORAL | Status: DC
  Filled 2011-11-06 (×3): qty 1

## 2011-11-06 MED ORDER — SODIUM CHLORIDE 0.9 % IR SOLN
Status: DC | PRN
  Administered 2011-11-06: 1000 mL

## 2011-11-06 MED ORDER — OXYCODONE HCL 5 MG PO TABS
5.0000 mg | ORAL_TABLET | ORAL | Status: DC | PRN
  Administered 2011-11-07 (×3): 10 mg via ORAL
  Administered 2011-11-07: 5 mg via ORAL
  Administered 2011-11-08 – 2011-11-09 (×11): 10 mg via ORAL
  Filled 2011-11-06 (×15): qty 2

## 2011-11-06 MED ORDER — ACETAMINOPHEN 325 MG PO TABS: 650.0000 mg | ORAL_TABLET | Freq: Four times a day (QID) | ORAL | Status: DC | PRN

## 2011-11-06 MED ORDER — CEFAZOLIN SODIUM 1-5 GM-% IV SOLN
INTRAVENOUS | Status: AC
  Filled 2011-11-06: qty 100

## 2011-11-06 MED ORDER — METOCLOPRAMIDE HCL 10 MG PO TABS: 5.0000 mg | ORAL_TABLET | Freq: Three times a day (TID) | ORAL | Status: DC | PRN

## 2011-11-06 MED ORDER — HYDROMORPHONE HCL PF 1 MG/ML IJ SOLN
0.2500 mg | INTRAMUSCULAR | Status: DC | PRN
  Administered 2011-11-06 (×3): 0.5 mg via INTRAVENOUS

## 2011-11-06 MED ORDER — ACETAMINOPHEN 10 MG/ML IV SOLN
1000.0000 mg | Freq: Four times a day (QID) | INTRAVENOUS | Status: AC
  Administered 2011-11-06 – 2011-11-07 (×3): 1000 mg via INTRAVENOUS
  Filled 2011-11-06 (×3): qty 100

## 2011-11-06 MED ORDER — METOPROLOL SUCCINATE ER 100 MG PO TB24
100.0000 mg | ORAL_TABLET | Freq: Every day | ORAL | Status: DC
  Administered 2011-11-06 – 2011-11-08 (×2): 100 mg via ORAL
  Filled 2011-11-06 (×4): qty 1

## 2011-11-06 MED ORDER — METOCLOPRAMIDE HCL 5 MG/ML IJ SOLN: 5.0000 mg | Freq: Three times a day (TID) | INTRAMUSCULAR | Status: DC | PRN

## 2011-11-06 MED ORDER — DIPHENHYDRAMINE HCL 12.5 MG/5ML PO ELIX: 12.5000 mg | ORAL_SOLUTION | ORAL | Status: DC | PRN

## 2011-11-06 MED ORDER — SODIUM CHLORIDE 0.9 % IV SOLN: INTRAVENOUS | Status: DC

## 2011-11-06 MED ORDER — CEFAZOLIN SODIUM-DEXTROSE 2-3 GM-% IV SOLR
2.0000 g | Freq: Once | INTRAVENOUS | Status: AC
  Administered 2011-11-06: 2 g via INTRAVENOUS

## 2011-11-06 MED ORDER — MORPHINE SULFATE (PF) 1 MG/ML IV SOLN
INTRAVENOUS | Status: DC
  Administered 2011-11-06: 16:00:00 via INTRAVENOUS
  Administered 2011-11-06: 12 mg via INTRAVENOUS
  Administered 2011-11-06: 5 mg via INTRAVENOUS
  Administered 2011-11-06: 10 mg via INTRAVENOUS
  Administered 2011-11-07: 7 mg via INTRAVENOUS
  Administered 2011-11-07: 17 mg via INTRAVENOUS
  Filled 2011-11-06 (×2): qty 25

## 2011-11-06 MED ORDER — BUPIVACAINE 0.25 % ON-Q PUMP SINGLE CATH 300ML
INJECTION | Status: DC | PRN
  Administered 2011-11-06: 300 mL

## 2011-11-06 MED ORDER — BUPIVACAINE 0.25 % ON-Q PUMP SINGLE CATH 300ML: 300.0000 mL | INJECTION | Status: DC

## 2011-11-06 MED ORDER — DOCUSATE SODIUM 100 MG PO CAPS
100.0000 mg | ORAL_CAPSULE | Freq: Two times a day (BID) | ORAL | Status: DC
  Administered 2011-11-06 – 2011-11-09 (×6): 100 mg via ORAL
  Filled 2011-11-06 (×5): qty 1

## 2011-11-06 MED ORDER — HYDROMORPHONE HCL PF 1 MG/ML IJ SOLN
0.2500 mg | INTRAMUSCULAR | Status: DC | PRN
  Administered 2011-11-06: 0.5 mg via INTRAVENOUS

## 2011-11-06 MED ORDER — MORPHINE SULFATE (PF) 1 MG/ML IV SOLN
INTRAVENOUS | Status: AC
  Filled 2011-11-06: qty 25

## 2011-11-06 MED ORDER — BISACODYL 10 MG RE SUPP: 10.0000 mg | Freq: Every day | RECTAL | Status: DC | PRN

## 2011-11-06 MED ORDER — METHOCARBAMOL 500 MG PO TABS
500.0000 mg | ORAL_TABLET | Freq: Four times a day (QID) | ORAL | Status: DC | PRN
  Administered 2011-11-07 – 2011-11-09 (×7): 500 mg via ORAL
  Filled 2011-11-06 (×7): qty 1

## 2011-11-06 MED ORDER — BUPIVACAINE 0.25 % ON-Q PUMP SINGLE CATH 300ML
INJECTION | Status: AC
  Filled 2011-11-06: qty 300

## 2011-11-06 MED ORDER — CEFAZOLIN SODIUM 1-5 GM-% IV SOLN
1.0000 g | Freq: Four times a day (QID) | INTRAVENOUS | Status: AC
  Administered 2011-11-06 – 2011-11-07 (×3): 1 g via INTRAVENOUS
  Filled 2011-11-06 (×3): qty 50

## 2011-11-06 MED ORDER — SUCCINYLCHOLINE CHLORIDE 20 MG/ML IJ SOLN
INTRAMUSCULAR | Status: DC | PRN
  Administered 2011-11-06: 100 mg via INTRAVENOUS

## 2011-11-06 MED ORDER — BUPIVACAINE ON-Q PAIN PUMP (FOR ORDER SET NO CHG)
INJECTION | Status: DC
  Filled 2011-11-06: qty 1

## 2011-11-06 MED ORDER — ONDANSETRON HCL 4 MG PO TABS: 4.0000 mg | ORAL_TABLET | Freq: Four times a day (QID) | ORAL | Status: DC | PRN

## 2011-11-06 MED ORDER — ONDANSETRON HCL 4 MG/2ML IJ SOLN
INTRAMUSCULAR | Status: DC | PRN
  Administered 2011-11-06: 4 mg via INTRAVENOUS

## 2011-11-06 MED ORDER — PROPOFOL 10 MG/ML IV BOLUS
INTRAVENOUS | Status: DC | PRN
  Administered 2011-11-06: 150 mg via INTRAVENOUS

## 2011-11-06 MED ORDER — POLYETHYLENE GLYCOL 3350 17 G PO PACK
17.0000 g | PACK | Freq: Every day | ORAL | Status: DC | PRN
  Administered 2011-11-08: 17 g via ORAL
  Filled 2011-11-06: qty 1

## 2011-11-06 MED ORDER — MIDAZOLAM HCL 5 MG/5ML IJ SOLN
INTRAMUSCULAR | Status: DC | PRN
  Administered 2011-11-06: 2 mg via INTRAVENOUS

## 2011-11-06 MED ORDER — FLEET ENEMA 7-19 GM/118ML RE ENEM: 1.0000 | ENEMA | Freq: Once | RECTAL | Status: AC | PRN

## 2011-11-06 MED ORDER — RIVAROXABAN 10 MG PO TABS
10.0000 mg | ORAL_TABLET | Freq: Every day | ORAL | Status: DC
  Administered 2011-11-07 – 2011-11-09 (×3): 10 mg via ORAL
  Filled 2011-11-06 (×3): qty 1

## 2011-11-06 MED ORDER — POTASSIUM CHLORIDE IN NACL 20-0.9 MEQ/L-% IV SOLN
INTRAVENOUS | Status: AC
  Filled 2011-11-06: qty 1000

## 2011-11-06 MED ORDER — TEMAZEPAM 15 MG PO CAPS: 15.0000 mg | ORAL_CAPSULE | Freq: Every evening | ORAL | Status: DC | PRN

## 2011-11-06 MED ORDER — ATORVASTATIN CALCIUM 20 MG PO TABS
20.0000 mg | ORAL_TABLET | Freq: Every day | ORAL | Status: DC
  Administered 2011-11-07 – 2011-11-08 (×2): 20 mg via ORAL
  Filled 2011-11-06 (×3): qty 1

## 2011-11-06 MED ORDER — POTASSIUM CHLORIDE IN NACL 20-0.9 MEQ/L-% IV SOLN
INTRAVENOUS | Status: DC
  Administered 2011-11-06: 18:00:00 via INTRAVENOUS
  Filled 2011-11-06 (×3): qty 1000

## 2011-11-06 SURGICAL SUPPLY — 50 items
BAG SPEC THK2 15X12 ZIP CLS (MISCELLANEOUS) ×1
BAG ZIPLOCK 12X15 (MISCELLANEOUS) ×2 IMPLANT
BANDAGE ELASTIC 6 VELCRO ST LF (GAUZE/BANDAGES/DRESSINGS) ×2 IMPLANT
BANDAGE ESMARK 6X9 LF (GAUZE/BANDAGES/DRESSINGS) ×1 IMPLANT
BLADE SAG 18X100X1.27 (BLADE) ×2 IMPLANT
BLADE SAW SGTL 11.0X1.19X90.0M (BLADE) ×2 IMPLANT
BNDG CMPR 9X6 STRL LF SNTH (GAUZE/BANDAGES/DRESSINGS) ×1
BNDG ESMARK 6X9 LF (GAUZE/BANDAGES/DRESSINGS) ×2
BOWL SMART MIX CTS (DISPOSABLE) ×2 IMPLANT
CATH KIT ON-Q SILVERSOAK 5 (CATHETERS) ×1 IMPLANT
CATH KIT ON-Q SILVERSOAK 5IN (CATHETERS) ×2 IMPLANT
CEMENT HV SMART SET (Cement) ×4 IMPLANT
CLOTH BEACON ORANGE TIMEOUT ST (SAFETY) ×2 IMPLANT
CLSR STERI-STRIP ANTIMIC 1/2X4 (GAUZE/BANDAGES/DRESSINGS) ×1 IMPLANT
CUFF TOURN SGL QUICK 34 (TOURNIQUET CUFF) ×2
CUFF TRNQT CYL 34X4X40X1 (TOURNIQUET CUFF) ×1 IMPLANT
DRAPE EXTREMITY T 121X128X90 (DRAPE) ×2 IMPLANT
DRAPE POUCH INSTRU U-SHP 10X18 (DRAPES) ×2 IMPLANT
DRAPE U-SHAPE 47X51 STRL (DRAPES) ×2 IMPLANT
DRSG ADAPTIC 3X8 NADH LF (GAUZE/BANDAGES/DRESSINGS) ×2 IMPLANT
DRSG PAD ABDOMINAL 8X10 ST (GAUZE/BANDAGES/DRESSINGS) ×1 IMPLANT
DURAPREP 26ML APPLICATOR (WOUND CARE) ×2 IMPLANT
ELECT REM PT RETURN 9FT ADLT (ELECTROSURGICAL) ×2
ELECTRODE REM PT RTRN 9FT ADLT (ELECTROSURGICAL) ×1 IMPLANT
EVACUATOR 1/8 PVC DRAIN (DRAIN) ×2 IMPLANT
FACESHIELD LNG OPTICON STERILE (SAFETY) ×10 IMPLANT
GLOVE BIO SURGEON STRL SZ7.5 (GLOVE) ×2 IMPLANT
GLOVE BIO SURGEON STRL SZ8 (GLOVE) ×2 IMPLANT
GLOVE BIOGEL PI IND STRL 8 (GLOVE) ×2 IMPLANT
GLOVE BIOGEL PI INDICATOR 8 (GLOVE) ×2
GOWN STRL NON-REIN LRG LVL3 (GOWN DISPOSABLE) ×2 IMPLANT
GOWN STRL REIN XL XLG (GOWN DISPOSABLE) ×2 IMPLANT
HANDPIECE INTERPULSE COAX TIP (DISPOSABLE) ×2
IMMOBILIZER KNEE 22 UNIV (SOFTGOODS) ×1 IMPLANT
KIT BASIN OR (CUSTOM PROCEDURE TRAY) ×2 IMPLANT
MANIFOLD NEPTUNE II (INSTRUMENTS) ×2 IMPLANT
NS IRRIG 1000ML POUR BTL (IV SOLUTION) ×2 IMPLANT
PACK TOTAL JOINT (CUSTOM PROCEDURE TRAY) ×2 IMPLANT
PADDING CAST COTTON 6X4 STRL (CAST SUPPLIES) ×4 IMPLANT
POSITIONER SURGICAL ARM (MISCELLANEOUS) ×2 IMPLANT
SET HNDPC FAN SPRY TIP SCT (DISPOSABLE) ×1 IMPLANT
SPONGE GAUZE 4X4 12PLY (GAUZE/BANDAGES/DRESSINGS) ×2 IMPLANT
STRIP CLOSURE SKIN 1/2X4 (GAUZE/BANDAGES/DRESSINGS) ×4 IMPLANT
SUCTION FRAZIER 12FR DISP (SUCTIONS) ×2 IMPLANT
SUT MNCRL AB 4-0 PS2 18 (SUTURE) ×2 IMPLANT
SUT VIC AB 2-0 CT1 27 (SUTURE) ×6
SUT VIC AB 2-0 CT1 TAPERPNT 27 (SUTURE) ×3 IMPLANT
TOWEL OR 17X26 10 PK STRL BLUE (TOWEL DISPOSABLE) ×4 IMPLANT
TRAY FOLEY CATH 14FRSI W/METER (CATHETERS) ×2 IMPLANT
WATER STERILE IRR 1500ML POUR (IV SOLUTION) ×2 IMPLANT

## 2011-11-06 NOTE — Anesthesia Postprocedure Evaluation (Signed)
  Anesthesia Post-op Note  Patient: Colin Ryan  Procedure(s) Performed: Procedure(s) (LRB): TOTAL KNEE ARTHROPLASTY (Right)  Patient Location: PACU  Anesthesia Type: General  Level of Consciousness: awake and alert   Airway and Oxygen Therapy: Patient Spontanous Breathing  Post-op Pain: mild  Post-op Assessment: Post-op Vital signs reviewed, Patient's Cardiovascular Status Stable, Respiratory Function Stable, Patent Airway and No signs of Nausea or vomiting  Post-op Vital Signs: stable  Complications: No apparent anesthesia complications

## 2011-11-06 NOTE — H&P (View-Only) (Signed)
Colin Ryan  DOB: 08/12/1951 Married / Language: English / Race: White / Male  Date of Admission:  11/06/2011  Chief complaint:  Right knee pain  History of Present Illness The patient is a 60 year old male who comes in today for a preoperative History and Physical. The patient is scheduled for a right total knee arthroplasty to be performed by Dr. Frank V. Aluisio, MD at [facility] on 11/06/2011. The patient is a 60 year old male presenting for a post-operative visit. The patient comes in today 6 months out from left total knee arthroplasty. Colin Ryan is here because his left knee is doing great at this time. He states he does not have much pain. He is walking much better. Everyone at work is very impressed. The only problem he is having now is with the right knee. He has known bone on bone medial arthritis in the right. It is getting much worse. He is wondering about going ahead and getting the right knee fixed. They have been treated conservatively in the past for the above stated problem and despite conservative measures, they continue to have progressive pain and severe functional limitations and dysfunction. They have failed non-operative management including home exercise, medications, and injections. It is felt that they would benefit from undergoing total joint replacement. Risks and benefits of the procedure have been discussed with the patient and they elect to proceed with surgery. There are no active contraindications to surgery such as ongoing infection or rapidly progressive neurological disease.  Allergies No Known Drug Allergies  Medication History Ramipril (5MG Capsule, Oral two times daily) Active. Crestor (10MG Tablet, Oral daily) Active. Toprol XL (100MG Tablet ER 24HR, Oral daily) Active. Aspirin EC (81MG Tablet DR, Oral daily) Active.  Problem List/Past Medical Vertigo Coronary Artery Disease/Heart Disease Myocardial infarction.  2001 Hypercholesterolemia Coronary Artery Bypass Graft Stent. Cardiac  Past Surgical History Coronary Artery Bypass, Three. Date: 2003.  Family History Father. Deceased, Alzheimer's disease. age 78 Mother. Cancer. age 58, Liver cancer  Social History Tobacco use. Former smoker. Alcohol use. Never consumed alcohol. Marital status. Married. Children. 2 Living situation. Lives with spouse. Post-Surgical Plans. Plan to go home  Review of Systems General:Not Present- Chills, Fever, Night Sweats, Fatigue, Weight Gain, Weight Loss and Memory Loss. Skin:Not Present- Hives, Itching, Rash, Eczema and Lesions. HEENT:Not Present- Tinnitus, Headache, Double Vision, Visual Loss, Hearing Loss and Dentures. Respiratory:Not Present- Shortness of breath with exertion, Shortness of breath at rest, Allergies, Coughing up blood and Chronic Cough. Cardiovascular:Not Present- Chest Pain, Racing/skipping heartbeats, Difficulty Breathing Lying Down, Murmur, Swelling and Palpitations. Gastrointestinal:Not Present- Bloody Stool, Heartburn, Abdominal Pain, Vomiting, Nausea, Constipation, Diarrhea, Difficulty Swallowing, Jaundice and Loss of appetitie. Male Genitourinary:Not Present- Urinary frequency, Blood in Urine, Weak urinary stream, Discharge, Flank Pain, Incontinence, Painful Urination, Urgency, Urinary Retention and Urinating at Night. Musculoskeletal:Present- Joint Pain. Not Present- Muscle Weakness, Muscle Pain, Joint Swelling, Back Pain, Morning Stiffness and Spasms. Neurological:Not Present- Tremor, Dizziness, Blackout spells, Paralysis, Difficulty with balance and Weakness. Psychiatric:Not Present- Insomnia.   Vitals Weight: 265 lb Height: 73 in Body Surface Area: 2.49 m Body Mass Index: 34.96 kg/m Pulse: 64 (Regular) Resp.: 16 (Unlabored) BP: 122/66 (Sitting, Right Arm, Standard)    Physical Exam  The physical exam findings are as follows: Patient  is a 60 year old male with continued right knee pain.   General Mental Status - Alert, cooperative and good historian. General Appearance- pleasant. Not in acute distress. Orientation- Oriented X3. Build & Nutrition-   Well nourished and Well developed.   Head and Neck Head- normocephalic, atraumatic . Neck Global Assessment- supple. no bruit auscultated on the right and no bruit auscultated on the left.   Eye Pupil- Bilateral- Regular and Round. Motion- Bilateral- EOMI.   Chest and Lung Exam Auscultation: Breath sounds:- clear at anterior chest wall and - clear at posterior chest wall. Adventitious sounds:- No Adventitious sounds.   Cardiovascular Auscultation:Rhythm- Regular rate and rhythm. Heart Sounds- S1 WNL and S2 WNL. Murmurs & Other Heart Sounds:Auscultation of the heart reveals - No Murmurs.   Abdomen Inspection:Contour- Generalized mild distention. Palpation/Percussion:Tenderness- Abdomen is non-tender to palpation. Rigidity (guarding)- Abdomen is soft. Auscultation:Auscultation of the abdomen reveals - Bowel sounds normal.   Male Genitourinary  Not done, not pertinent to present illness  Musculoskeletal On exam he is alert and oriented. No apparent distress. The left knee looks great. Range of motion is 0-122 degrees. There is no tenderness or instability. The right knee shows a varus deformity. No effusion. Range is 5-120. Moderate crepitus on range of motion with tenderness, medial greater than lateral. No instability. Pulses and motor are intact both lower extremities.  RADIOGRAPHS: AP and lateral of the left knee show the prosthesis to be in excellent position with no periprosthetic abnormalities. I have reviewed the X-rays from last year on the right, he was bone on bone medial and patellofemoral. He has a varus deformity with significant subchondral changes in the tibia.  Assessment & Plan Osteoarthritis  Right  Knee  Patient is for a Right Total Knee Replacement by Dr. Aluisio.  Plan is to go home after the hospital stay.  Cards - Dr. Harding - Patient has been seen by Dr. Harding and felt to be stable and low to intermediate risk for the upcoming procedure.  Drew Abril Cappiello, PA-C  

## 2011-11-06 NOTE — Transfer of Care (Signed)
Immediate Anesthesia Transfer of Care Note  Patient: Colin Ryan  Procedure(s) Performed: Procedure(s) (LRB): TOTAL KNEE ARTHROPLASTY (Right)  Patient Location: PACU  Anesthesia Type: General  Level of Consciousness: awake, alert , oriented and patient cooperative  Airway & Oxygen Therapy: Patient Spontanous Breathing and Patient connected to face mask oxygen  Post-op Assessment: Report given to PACU RN and Post -op Vital signs reviewed and stable  Post vital signs: Reviewed and stable  Complications: No apparent anesthesia complications

## 2011-11-06 NOTE — Plan of Care (Signed)
Problem: Consults Goal: Diagnosis- Total Joint Replacement Outcome: Completed/Met Date Met:  11/06/11 Right total knee

## 2011-11-06 NOTE — Op Note (Signed)
Pre-operative diagnosis- Osteoarthritis  Right knee(s)  Post-operative diagnosis- Osteoarthritis Right knee(s)  Procedure-  Right  Total Knee Arthroplasty  Surgeon- Gus Rankin. Clarice Bonaventure, MD  Assistant- Avel Peace, PA-C   Anesthesia-  General EBL-* No blood loss amount entered *  Drains Hemovac  Tourniquet time-  Total Tourniquet Time Documented: Thigh (Right) - 36 minutes   Complications- None  Condition-PACU - hemodynamically stable.   Brief Clinical Note  Colin Ryan is a 61 y.o. year old male with end stage OA of his right knee with progressively worsening pain and dysfunction. He has constant pain, with activity and at rest and significant functional deficits with difficulties even with ADLs. He has had extensive non-op management including analgesics, injections of cortisone and viscosupplements, and home exercise program, but remains in significant pain with significant dysfunction. Radiographs demonstrate bone on bone arthritis medially and patellofemoral with tibial subchondral sclerosis. He presents now for right Total Knee Arthroplasty.    Procedure in detail---   The patient is brought into the operating room and positioned supine on the operating table. After successful administration of  General,   a tourniquet is placed high on the  Right thigh(s) and the lower extremity is prepped and draped in the usual sterile fashion. Time out is performed by the operating team and then the  Right lower extremity is wrapped in Esmarch, knee flexed and the tourniquet inflated to 300 mmHg.       A midline incision is made with a ten blade through the subcutaneous tissue to the level of the extensor mechanism. A fresh blade is used to make a medial parapatellar arthrotomy. Soft tissue over the proximal medial tibia is subperiosteally elevated to the joint line with a knife and into the semimembranosus bursa with a Cobb elevator. Soft tissue over the proximal lateral tibia is elevated with  attention being paid to avoiding the patellar tendon on the tibial tubercle. The patella is everted, knee flexed 90 degrees and the ACL and PCL are removed. Findings are bone on bone medial with large osteophytes.        The drill is used to create a starting hole in the distal femur and the canal is thoroughly irrigated with sterile saline to remove the fatty contents. The 5 degree Right  valgus alignment guide is placed into the femoral canal and the distal femoral cutting block is pinned to remove 11 mm off the distal femur. Resection is made with an oscillating saw.      The tibia is subluxed forward and the menisci are removed. The extramedullary alignment guide is placed referencing proximally at the medial aspect of the tibial tubercle and distally along the second metatarsal axis and tibial crest. The block is pinned to remove 2mm off the more deficient medial  side. Resection is made with an oscillating saw. Size 4is the most appropriate size for the tibia and the proximal tibia is prepared with the modular drill and keel punch for that size.      The femoral sizing guide is placed and size 4 is most appropriate. Rotation is marked off the epicondylar axis and confirmed by creating a rectangular flexion gap at 90 degrees. The size 4 cutting block is pinned in this rotation and the anterior, posterior and chamfer cuts are made with the oscillating saw. The intercondylar block is then placed and that cut is made.      Trial size 4 tibial component, trial size 4 posterior stabilized femur and a 15  mm posterior stabilized rotating platform insert trial is placed. Full extension is achieved with excellent varus/valgus and anterior/posterior balance throughout full range of motion. The patella is everted and thickness measured to be 24  mm. Free hand resection is taken to 14 mm, a 38 template is placed, lug holes are drilled, trial patella is placed, and it tracks normally. Osteophytes are removed off the  posterior femur with the trial in place. All trials are removed and the cut bone surfaces prepared with pulsatile lavage. Cement is mixed and once ready for implantation, the size 4 tibial implant, size  4 posterior stabilized femoral component, and the size 38 patella are cemented in place and the patella is held with the clamp. The trial insert is placed and the knee held in full extension. All extruded cement is removed and once the cement is hard the permanent 15 mm posterior stabilized rotating platform insert is placed into the tibial tray.      The wound is copiously irrigated with saline solution and the extensor mechanism closed over a hemovac drain with #1 PDS suture. The tourniquet is released for a total tourniquet time of 36  minutes. Flexion against gravity is 140 degrees and the patella tracks normally. Subcutaneous tissue is closed with 2.0 vicryl and subcuticular with running 4.0 Monocryl. The catheter for the Marcaine pain pump is placed and the pump is initiated. The incision is cleaned and dried and steri-strips and a bulky sterile dressing are applied. The limb is placed into a knee immobilizer and the patient is awakened and transported to recovery in stable condition.      Please note that a surgical assistant was a medical necessity for this procedure in order to perform it in a safe and expeditious manner. Surgical assistant was necessary to retract the ligaments and vital neurovascular structures to prevent injury to them and also necessary for proper positioning of the limb to allow for anatomic placement of the prosthesis.   Gus Rankin Chestine Belknap, MD    11/06/2011, 3:23 PM

## 2011-11-06 NOTE — Preoperative (Signed)
Beta Blockers   Reason not to administer Beta Blockers:Not Applicable 

## 2011-11-06 NOTE — Interval H&P Note (Signed)
History and Physical Interval Note:  11/06/2011 2:02 PM  Colin Ryan  has presented today for surgery, with the diagnosis of osteoarthritis right knee  The various methods of treatment have been discussed with the patient and family. After consideration of risks, benefits and other options for treatment, the patient has consented to  Procedure(s) (LRB): TOTAL KNEE ARTHROPLASTY (Right) as a surgical intervention .  The patients' history has been reviewed, patient examined, no change in status, stable for surgery.  I have reviewed the patients' chart and labs.  Questions were answered to the patient's satisfaction.     Loanne Drilling

## 2011-11-06 NOTE — Anesthesia Preprocedure Evaluation (Addendum)
Anesthesia Evaluation  Patient identified by MRN, date of birth, ID band Patient awake    Reviewed: Allergy & Precautions, H&P , NPO status , Patient's Chart, lab work & pertinent test results  Airway Mallampati: III TM Distance: >3 FB Neck ROM: Full    Dental No notable dental hx.    Pulmonary neg pulmonary ROS, sleep apnea , former smoker breath sounds clear to auscultation  Pulmonary exam normal       Cardiovascular Exercise Tolerance: Good hypertension, Pt. on medications + CAD (s/p cabg 2003) and + Past MI negative cardio ROS  Rhythm:Regular Rate:Normal     Neuro/Psych negative neurological ROS  negative psych ROS   GI/Hepatic negative GI ROS, Neg liver ROS,   Endo/Other  negative endocrine ROS  Renal/GU negative Renal ROS  negative genitourinary   Musculoskeletal negative musculoskeletal ROS (+)   Abdominal   Peds negative pediatric ROS (+)  Hematology negative hematology ROS (+)   Anesthesia Other Findings   Reproductive/Obstetrics negative OB ROS                          Anesthesia Physical Anesthesia Plan  ASA: III  Anesthesia Plan: General   Post-op Pain Management:    Induction: Intravenous  Airway Management Planned: Oral ETT  Additional Equipment:   Intra-op Plan:   Post-operative Plan:   Informed Consent: I have reviewed the patients History and Physical, chart, labs and discussed the procedure including the risks, benefits and alternatives for the proposed anesthesia with the patient or authorized representative who has indicated his/her understanding and acceptance.   Dental advisory given  Plan Discussed with:   Anesthesia Plan Comments:         Anesthesia Quick Evaluation

## 2011-11-07 DIAGNOSIS — E871 Hypo-osmolality and hyponatremia: Secondary | ICD-10-CM | POA: Diagnosis not present

## 2011-11-07 DIAGNOSIS — E875 Hyperkalemia: Secondary | ICD-10-CM | POA: Diagnosis not present

## 2011-11-07 LAB — CBC
HCT: 41.5 % (ref 39.0–52.0)
MCV: 94.3 fL (ref 78.0–100.0)
Platelets: 234 10*3/uL (ref 150–400)
RBC: 4.4 MIL/uL (ref 4.22–5.81)
RDW: 13 % (ref 11.5–15.5)
WBC: 12.3 10*3/uL — ABNORMAL HIGH (ref 4.0–10.5)

## 2011-11-07 LAB — BASIC METABOLIC PANEL
BUN: 25 mg/dL — ABNORMAL HIGH (ref 6–23)
BUN: 29 mg/dL — ABNORMAL HIGH (ref 6–23)
CO2: 24 mEq/L (ref 19–32)
Chloride: 100 mEq/L (ref 96–112)
Creatinine, Ser: 1.13 mg/dL (ref 0.50–1.35)
GFR calc Af Amer: 83 mL/min — ABNORMAL LOW (ref >90)
GFR calc Af Amer: 79 mL/min — ABNORMAL LOW (ref >90)
GFR calc non Af Amer: 68 mL/min — ABNORMAL LOW (ref >90)
Potassium: 5.9 mEq/L — ABNORMAL HIGH (ref 3.5–5.1)
Potassium: 5 mEq/L (ref 3.5–5.1)

## 2011-11-07 LAB — TYPE AND SCREEN: Antibody Screen: NEGATIVE

## 2011-11-07 MED ORDER — SODIUM CHLORIDE 0.9 % IV SOLN
INTRAVENOUS | Status: DC
  Administered 2011-11-07: 08:00:00 via INTRAVENOUS

## 2011-11-07 MED ORDER — MORPHINE SULFATE 2 MG/ML IJ SOLN: 1.0000 mg | INTRAMUSCULAR | Status: DC | PRN

## 2011-11-07 NOTE — Evaluation (Signed)
Physical Therapy Evaluation Patient Details Name: Colin Ryan MRN: [id number] DOB: 08-30-1950 Today's Date: 11/07/2011  Problem List:  Patient Active Problem List  Diagnoses  . HYPERCHOLESTEROLEMIA  . MI  . CORONARY ARTERY DISEASE  . COPD, MILD  . CORONARY ARTERY BYPASS GRAFT, HX OF  . OA (osteoarthritis) of knee  . Postop Hyperkalemia  . Postop Hyponatremia    Past Medical History:  Past Medical History  Diagnosis Date  . MI (myocardial infarction)   . Arthritis   . Hypertension     LOV with EKG Dr Herbie [place] 11/12, clearance with note 2/13,   eccho 5/12, stress test 11/09  on chart       chest x ray 7/12 EPIC  . Hyperlipidemia   . Sleep apnea     STOP BANG SCORE 4   Past Surgical History:  Past Surgical History  Procedure Date  . Coronary angioplasty   . Tonsillectomy   . Joint replacement     left knee  . Mastoid surgery     right ear as a child  . Colonoscopy   . Coronary artery bypass graft 2003    PT Assessment/Plan/Recommendation PT Assessment Clinical Impression Statement: pt tolerated very well. pt will benefit from PT toimprove in ROM, strength and mobility to DC to home. PT Recommendation/Assessment: Patient will need skilled PT in the acute care venue PT Problem List: Decreased strength;Decreased range of motion;Decreased activity tolerance;Decreased mobility;Decreased knowledge of precautions;Pain PT Therapy Diagnosis : Difficulty walking;Abnormality of gait;Acute pain PT Plan PT Frequency: 7X/week PT Treatment/Interventions: DME instruction;Gait training;Stair training;Functional mobility training;Therapeutic activities;Therapeutic exercise;Patient/family education PT Recommendation Follow Up Recommendations: Home health PT;Supervision/Assistance - 24 hour Equipment Recommended: None recommended by PT PT Goals  Acute Rehab PT Goals PT Goal Formulation: With patient Time For Goal Achievement: 7 days Pt will go Supine/Side to Sit: with  supervision PT Goal: Supine/Side to Sit - Progress: Goal set today Pt will go Sit to Supine/Side: with supervision PT Goal: Sit to Supine/Side - Progress: Goal set today Pt will go Sit to Stand: with supervision PT Goal: Sit to Stand - Progress: Goal set today Pt will go Stand to Sit: with supervision PT Goal: Stand to Sit - Progress: Goal set today Pt will Ambulate: >150 feet;with supervision;with least restrictive assistive device PT Goal: Ambulate - Progress: Goal set today Pt will Go Up / Down Stairs: 1-2 stairs;with least restrictive assistive device PT Goal: Up/Down Stairs - Progress: Goal set today Pt will Perform Home Exercise Program: with supervision, verbal cues required/provided PT Goal: Perform Home Exercise Program - Progress: Goal set today  PT Evaluation Precautions/Restrictions  Precautions Precautions: Knee Required Braces or Orthoses: Yes Knee Immobilizer: Discontinue once straight leg raise with < 10 degree lag Prior Functioning  Home Living Lives With: Spouse Type of Home: House Home Layout: One level Home Access: Stairs to enter Entrance Stairs-Rails: None Entrance Stairs-Number of Steps: 2 Home Adaptive Equipment: Walker - rolling;Bedside commode/3-in-1 Prior Function Level of Independence: Independent with basic ADLs Able to Take Stairs?: Yes Cognition Cognition Arousal/Alertness: Awake/alert Overall Cognitive Status: Appears within functional limits for tasks assessed Orientation Level: Oriented X4 Sensation/Coordination Sensation Light Touch: Appears Intact Extremity Assessment RUE Assessment RUE Assessment: Within Functional Limits LUE Assessment LUE Assessment: Within Functional Limits RLE Assessment RLE Assessment: Exceptions to [facility] RLE AROM (degrees) RLE Overall AROM Comments: 45 degrees knee flexion RLE Strength RLE Overall Strength Comments: SLR with min assist LLE Assessment LLE Assessment: Within Functional Limits Mobility  (including Balance) Bed  Mobility Bed Mobility: Yes Supine to Sit: 3: Mod assist;With rails;HOB elevated (Comment degrees) Supine to Sit Details (indicate cue type and reason): vc on technique Transfers Transfers: Yes Sit to Stand: 4: Min assist;From bed;With upper extremity assist Sit to Stand Details (indicate cue type and reason): vc for push from bed. Stand to Sit: 4: Min assist;To chair/3-in-1;With armrests;With upper extremity assist Stand to Sit Details: vc to reach back, step out RLE Ambulation/Gait Ambulation/Gait: Yes Ambulation/Gait Assistance: 1: +2 Total assist Ambulation/Gait Assistance Details (indicate cue type and reason): pt=80 +2 for safety due to H/O vertigo Ambulation Distance (Feet): -15 Feet Assistive device: Rolling walker Gait Pattern: Step-to pattern  Posture/Postural Control Posture/Postural Control: No significant limitations Exercise  Total Joint Exercises Quad Sets: AROM;Right;10 reps Heel Slides: AAROM;Right;10 reps Hip ABduction/ADduction: AAROM;Right;10 reps Straight Leg Raises: AAROM;Right;10 reps End of Session PT - End of Session Equipment Utilized During Treatment: Right knee immobilizer Activity Tolerance: Patient tolerated treatment well Patient left: in chair;with call bell in reach;with family/visitor present Nurse Communication: Mobility status for transfers General Behavior During Session: [facility] for tasks performed  Rada Hay 11/07/2011, 3:54 PM  [phone]

## 2011-11-07 NOTE — Progress Notes (Signed)
OT Screen Order received, chart reviewed. Spoke briefly with pt who stated he has all necessary DME from previous knee sx and will have prn A at home from wife. Pt presents with no OT needs at this time. Will sign off.  Garrel Ridgel, OTR/L  Pager [phone] 11/07/2011

## 2011-11-07 NOTE — Progress Notes (Signed)
Physical Therapy Treatment Patient Details Name: Colin Ryan MRN: [id number] DOB: 1951-03-21 Today's Date: 11/07/2011  PT Assessment/Plan  PT - Assessment/Plan PT Frequency: 7X/week Follow Up Recommendations: Home health PT;Supervision/Assistance - 24 hour Equipment Recommended: None recommended by PT PT Goals  Acute Rehab PT Goals PT Goal Formulation: With patient Time For Goal Achievement: 7 days Pt will go Supine/Side to Sit: with supervision PT Goal: Supine/Side to Sit - Progress: Goal set today Pt will go Sit to Supine/Side: with supervision PT Goal: Sit to Supine/Side - Progress: Progressing toward goal Pt will go Sit to Stand: with supervision PT Goal: Sit to Stand - Progress: Progressing toward goal Pt will go Stand to Sit: with supervision PT Goal: Stand to Sit - Progress: Progressing toward goal Pt will Ambulate: >150 feet;with supervision;with least restrictive assistive device PT Goal: Ambulate - Progress: Progressing toward goal Pt will Go Up / Down Stairs: 1-2 stairs;with least restrictive assistive device PT Goal: Up/Down Stairs - Progress: Goal set today Pt will Perform Home Exercise Program: with supervision, verbal cues required/provided PT Goal: Perform Home Exercise Program - Progress: Goal set today  PT Treatment Precautions/Restrictions  Precautions Precautions: Knee Required Braces or Orthoses: Yes Knee Immobilizer: Discontinue once straight leg raise with < 10 degree lag Restrictions Weight Bearing Restrictions: No Mobility (including Balance) Bed Mobility Bed Mobility: Yes Supine to Sit: 3: Mod assist;With rails;HOB elevated (Comment degrees) Supine to Sit Details (indicate cue type and reason): vc on technique Sit to Supine: 4: Min assist;HOB flat Sit to Supine - Details (indicate cue type and reason): assist for RLE onto bed Transfers Transfers: Yes Sit to Stand: 4: Min assist;From chair/3-in-1;With armrests Sit to Stand Details (indicate cue  type and reason): to push up Stand to Sit: 4: Min assist;To bed;With upper extremity assist Stand to Sit Details: vc to reach back, step out RLE Ambulation/Gait Ambulation/Gait: Yes Ambulation/Gait Assistance: 1: +2 Total assist;4: Min assist Ambulation/Gait Assistance Details (indicate cue type and reason): + 2 for safetypt=80 +2 for safety due to H/O vertigo Ambulation Distance (Feet): 125 Feet Assistive device: Rolling walker Gait Pattern: Step-to pattern  Posture/Postural Control Posture/Postural Control: No significant limitations Exercise  Total Joint Exercises Ankle Circles/Pumps: AROM;10 reps;Right Quad Sets: AROM;Right;10 reps Heel Slides: AAROM;Right;10 reps Hip ABduction/ADduction: AAROM;Right;10 reps Straight Leg Raises: AAROM;Right;10 reps End of Session PT - End of Session Equipment Utilized During Treatment: Right knee immobilizer Activity Tolerance: Patient tolerated treatment well Patient left: in bed;with call bell in reach;with family/visitor present Nurse Communication: Mobility status for transfers General Behavior During Session: [facility] for tasks performed  Rada Hay 11/07/2011, 4:03 PM [phone]

## 2011-11-07 NOTE — Progress Notes (Signed)
Subjective: 1 Day Post-Op Procedure(s) (LRB): TOTAL KNEE ARTHROPLASTY (Right) Patient reports pain as mild.   Patient seen in rounds with Dr. Lequita Halt. Patient doing well on day one. We will start therapy today. Plan is to go home after hospital stay.  Objective: Vital signs in last 24 hours: Temp:  [97.7 F (36.5 C)-98.3 F (36.8 C)] 98.3 F (36.8 C) (03/19 0557) Pulse Rate:  [64-92] 86  (03/19 0557) Resp:  [8-20] 12  (03/19 0745) BP: (100-163)/(64-101) 104/65 mmHg (03/19 0557) SpO2:  [91 %-100 %] 97 % (03/19 0745) Weight:  [120.203 kg (265 lb)] 120.203 kg (265 lb) (03/18 1820)  Intake/Output from previous day:  Intake/Output Summary (Last 24 hours) at 11/07/11 0813 Last data filed at 11/07/11 0743  Gross per 24 hour  Intake 4361.66 ml  Output   1040 ml  Net 3321.66 ml    Intake/Output this shift: Total I/O In: 288.3 [I.V.:188.3; IV Piggyback:100] Out: -   Labs:  Basename 11/07/11 0443  HGB 13.6    Basename 11/07/11 0443  WBC 12.3*  RBC 4.40  HCT 41.5  PLT 234    Basename 11/07/11 0443  NA 134*  K 5.9*  CL 100  CO2 24  BUN 25*  CREATININE 1.09  GLUCOSE 168*  CALCIUM 8.2*   No results found for this basename: LABPT:2,INR:2 in the last 72 hours  Exam - Neurovascular intact Sensation intact distally Dressing - clean, dry, no drainage Motor function intact - moving foot and toes well on exam.  Hemovac pulled without difficulty.  Past Medical History  Diagnosis Date  . MI (myocardial infarction)   . Arthritis   . Hypertension     LOV with EKG Dr Herbie [place] 11/12, clearance with note 2/13,   eccho 5/12, stress test 11/09  on chart       chest x ray 7/12 EPIC  . Hyperlipidemia   . Sleep apnea     STOP BANG SCORE 4    Assessment/Plan: 1 Day Post-Op Procedure(s) (LRB): TOTAL KNEE ARTHROPLASTY (Right) Principal Problem:  *OA (osteoarthritis) of knee Active Problems:  Postop Hyperkalemia  Iatrogenic and/or Hemolysis - changed the IV fluids and  removed the K+, recheck the K+ level at noon.  Advance diet Up with therapy Continue foley due to strict I&O and urinary output monitoring Discharge home with home health  DVT Prophylaxis - Xarelto Protocol Weight-Bearing as tolerated to right leg Keep foley until tomorrow. No vaccines. D/C PCA Morphine, Change to IV push D/C O2 and Pulse OX and try on Room [address]  Patrica Duel 11/07/2011, 8:13 AM

## 2011-11-08 LAB — BASIC METABOLIC PANEL
Chloride: 101 mEq/L (ref 96–112)
GFR calc Af Amer: >90 mL/min (ref >90)
GFR calc non Af Amer: 87 mL/min — ABNORMAL LOW (ref >90)
Potassium: 4.8 mEq/L (ref 3.5–5.1)
Sodium: 136 mEq/L (ref 135–145)

## 2011-11-08 LAB — CBC
MCHC: 32.5 g/dL (ref 30.0–36.0)
RDW: 13.1 % (ref 11.5–15.5)
WBC: 11.4 10*3/uL — ABNORMAL HIGH (ref 4.0–10.5)

## 2011-11-08 MED ORDER — OXYCODONE HCL 5 MG PO TABS: 5.0000 mg | ORAL_TABLET | ORAL | Status: AC | PRN

## 2011-11-08 MED ORDER — METHOCARBAMOL 500 MG PO TABS: 500.0000 mg | ORAL_TABLET | Freq: Four times a day (QID) | ORAL | Status: AC | PRN

## 2011-11-08 MED ORDER — RIVAROXABAN 10 MG PO TABS: 10.0000 mg | ORAL_TABLET | Freq: Every day | ORAL | Status: DC

## 2011-11-08 NOTE — Discharge Instructions (Signed)
Knee Rehabilitation, Guidelines Following Surgery Results after knee surgery are often greatly improved when you follow the exercise, range of motion and muscle strengthening exercises prescribed by your doctor. Safety measures are also important to protect the knee from further injury. Any time any of these exercises cause you to have increased pain or swelling in your knee joint, decrease the amount until you are comfortable again and slowly increase them. If you have problems or questions, call your caregiver or physical therapist for advice. HOME CARE INSTRUCTIONS   Remove items at home which could result in a fall. This includes throw rugs or furniture in walking pathways.   Continue medications as instructed.   You may shower or take tub baths when your staples or stitches are removed or as instructed.   Walk using crutches or walker as instructed.   Put weight on your legs and walk as much as is comfortable.   You may resume a sexual relationship in one month or when given the OK by your doctor.   Return to work as instructed by your doctor.   Do not drive a car for 6 weeks or as instructed.   Wear elastic stockings until instructed not to.   Make sure you keep all of your appointments after your operation with all of your doctors and caregivers.  RANGE OF MOTION AND STRENGTHENING EXERCISES Rehabilitation of the knee is important following a knee injury or an operation. After just a few days of immobilization, the muscles of the thigh which control the knee become weakened and shrink (atrophy). Knee exercises are designed to build up the tone and strength of the thigh muscles and to improve knee motion. Often times heat used for twenty to thirty minutes before working out will loosen up your tissues and help with improving the range of motion. These exercises can be done on a training (exercise) mat, on the floor, on a table or on a bed. Use what ever works the best and is most  comfortable for you Knee exercises include:  Leg Lifts - While your knee is still immobilized in a splint or cast, you can do straight leg raises. Lift the leg to 60 degrees, hold for 3 sec, and slowly lower the leg. Repeat 10-20 times 2-3 times daily. Perform this exercise against resistance later as your knee gets better.   Quad and Hamstring Sets - Tighten up the muscle on the front of the thigh (Quad) and hold for 5-10 sec. Repeat this 10-20 times hourly. Hamstring sets are done by pushing the foot backward against an object and holding for 5-10 sec. Repeat as with quad sets.  A rehabilitation program following serious knee injuries can speed recovery and prevent re-injury in the future due to weakened muscles. Contact your doctor or a physical therapist for more information on knee rehabilitation. MAKE SURE YOU:   Understand these instructions.   Will watch your condition.   Will get help right away if you are not doing well or get worse.  Document Released: 08/07/2005 Document Revised: 07/27/2011 Document Reviewed: 01/25/2007 [employer] Patient Information 2012 [employer].  Pick up stool softner and laxative for home. Do not submerge incision under water. May shower. Continue to use ice for pain and swelling from surgery.  

## 2011-11-08 NOTE — Progress Notes (Signed)
Physical Therapy Treatment Patient Details Name: Colin Ryan MRN: [id number] DOB: Oct 31, 1950 Today's Date: 11/08/2011 1000-1034 PT Assessment/Plan  PT - Assessment/Plan Comments on Treatment Session: pt expresses concern for increase in pain as opposed to 3/19 eval. explained that this is typical, 2nd day.  pt does not appear to be at a functional safe level to DC. will see how pain is controlled. Colin Gower, PA aware. pt appears w/ some anxiety about DC. PT Plan: Discharge plan remains appropriate;Frequency remains appropriate Follow Up Recommendations: Home health PT;Supervision/Assistance - 24 hour Equipment Recommended: None recommended by PT PT Goals  Acute Rehab PT Goals Time For Goal Achievement: 7 days Pt will go Supine/Side to Sit: with supervision PT Goal: Supine/Side to Sit - Progress: Progressing toward goal Pt will go Sit to Stand: with supervision PT Goal: Sit to Stand - Progress: Progressing toward goal Pt will go Stand to Sit: with supervision PT Goal: Stand to Sit - Progress: Progressing toward goal Pt will Ambulate: >150 feet;with supervision;with least restrictive assistive device PT Goal: Ambulate - Progress: Progressing toward goal Pt will Perform Home Exercise Program: with supervision, verbal cues required/provided PT Goal: Perform Home Exercise Program - Progress: Progressing toward goal  PT Treatment Precautions/Restrictions  Precautions Precautions: Knee Required Braces or Orthoses: Yes Knee Immobilizer: Discontinue once straight leg raise with < 10 degree lag Restrictions Weight Bearing Restrictions: No Mobility (including Balance) Bed Mobility Supine to Sit: 4: Min assist;With rails;HOB flat Transfers Sit to Stand: 4: Min assist;From bed;With upper extremity assist Sit to Stand Details (indicate cue type and reason): vc for push from bed. Stand to Sit: 4: Min assist;To chair/3-in-1;With armrests;With upper extremity assist Stand to Sit Details: vc to  reach back, step out RLE Ambulation/Gait Ambulation/Gait Assistance: 1: +2 Total assist Ambulation/Gait Assistance Details (indicate cue type and reason): + 1 for safety pt=80% Ambulation Distance (Feet): 80 Feet (much more pain this am) Assistive device: Rolling walker Gait Pattern: Decreased stance time - right    Exercise  Total Joint Exercises Quad Sets: AROM;Right;10 reps Heel Slides: AAROM;Right;10 reps Hip ABduction/ADduction: AAROM;Right;10 reps Straight Leg Raises: AAROM;Right;10 reps End of Session PT - End of Session Equipment Utilized During Treatment: Right knee immobilizer Activity Tolerance: Patient limited by pain Patient left: in chair;with call bell in reach Nurse Communication: Mobility status for transfers (pain issues) General Behavior During Session: Colin Ryan for tasks performed (pt concerned about lack of progress and stiffness)  Rada Hay 11/08/2011, 4:50 PM

## 2011-11-08 NOTE — Progress Notes (Signed)
Physical Therapy Treatment Patient Details Name: Colin Ryan MRN: [id number] DOB: [birth year] Today's Date: 11/08/2011 4098-1191 PT Assessment/Plan  PT - Assessment/Plan Comments on Treatment Session: pt tolerated CPM and felt it loosened knee. pt performed ther ex. given sheet around foot to work on knee flex. Ice placed also PT Plan: Discharge plan remains appropriate;Frequency remains appropriate Follow Up Recommendations: Home health PT;Supervision/Assistance - 24 hour Equipment Recommended: None recommended by PT PT Goals  Acute Rehab PT Goals Time For Goal Achievement: 7 days Pt will go Supine/Side to Sit: with supervision PT Goal: Supine/Side to Sit - Progress: Progressing toward goal Pt will go Sit to Supine/Side: with supervision PT Goal: Sit to Supine/Side - Progress: Progressing toward goal Pt will go Sit to Stand: with supervision PT Goal: Sit to Stand - Progress: Progressing toward goal Pt will go Stand to Sit: with supervision PT Goal: Stand to Sit - Progress: Progressing toward goal Pt will Ambulate: >150 feet;with supervision;with least restrictive assistive device PT Goal: Ambulate - Progress: Progressing toward goal Pt will Perform Home Exercise Program: with supervision, verbal cues required/provided PT Goal: Perform Home Exercise Program - Progress: Progressing toward goal  PT Treatment Precautions/Restrictions  Precautions Precautions: Knee Required Braces or Orthoses: Yes Knee Immobilizer: Discontinue once straight leg raise with < 10 degree lag Restrictions Weight Bearing Restrictions: No Mobility (including Balance)      Exercise  Total Joint Exercises Quad Sets: AROM;Right;10 reps Heel Slides: AAROM;Right;10 reps Hip ABduction/ADduction: AAROM;Right;10 reps Straight Leg Raises: AAROM;Right;10 reps End of Session PT - End of Session Equipment Utilized During Treatment:  (sheet for exercises) Activity Tolerance: Patient limited by pain Patient  left: in bed;with call bell in reach Nurse Communication: Mobility status for transfers (pain issues) General Behavior During Session: [facility] for tasks performed  Rada Hay 11/08/2011, 4:57 PM

## 2011-11-08 NOTE — Progress Notes (Signed)
Subjective: 2 Days Post-Op Procedure(s) (LRB): TOTAL KNEE ARTHROPLASTY (Right) Patient reports pain as mild.   Patient doing well. Progressing with therapy.  Ready to go home.  Objective: Vital signs in last 24 hours: Temp:  [97.8 F (36.6 C)-98.3 F (36.8 C)] 98.2 F (36.8 C) (03/20 0831) Pulse Rate:  [64-75] 75  (03/20 0831) Resp:  [14-18] 18  (03/20 0831) BP: (104-128)/(63-79) 117/79 mmHg (03/20 0831) SpO2:  [91 %-95 %] 91 % (03/20 0831)  Intake/Output from previous day:  Intake/Output Summary (Last 24 hours) at 11/08/11 1243 Last data filed at 11/08/11 0913  Gross per 24 hour  Intake 1367.33 ml  Output   2775 ml  Net -1407.67 ml    Intake/Output this shift: Total I/O In: 287.3 [P.O.:240; I.V.:47.3] Out: 525 [Urine:525]  Labs:  [facility] 11/08/11 0422 11/07/11 0443  HGB 11.9* 13.6    Basename 11/08/11 0422 11/07/11 0443  WBC 11.4* 12.3*  RBC 3.85* 4.40  HCT 36.6* 41.5  PLT 189 234    Basename 11/08/11 0422 11/07/11 1245  NA 136 137  K 4.8 5.0  CL 101 98  CO2 27 29  BUN 22 29*  CREATININE 0.98 1.13  GLUCOSE 110* 185*  CALCIUM 8.3* 8.8   No results found for this basename: LABPT:2,INR:2 in the last 72 hours  Exam: Neurovascular intact Sensation intact distally Incision - clean, dry, no drainage Motor function intact - moving foot and toes well on exam.   Assessment/Plan: 2 Days Post-Op Procedure(s) (LRB): TOTAL KNEE ARTHROPLASTY (Right) Procedure(s) (LRB): TOTAL KNEE ARTHROPLASTY (Right) Past Medical History  Diagnosis Date  . MI (myocardial infarction)   . Arthritis   . Hypertension     LOV with EKG Dr Herbie [place] 11/12, clearance with note 2/13,   eccho 5/12, stress test 11/09  on chart       chest x ray 7/12 EPIC  . Hyperlipidemia   . Sleep apnea     STOP BANG SCORE 4   Principal Problem:  *OA (osteoarthritis) of knee Active Problems:  Postop Hyperkalemia  Postop Hyponatremia   Advance diet Up with therapy Discharge home with home  health Diet - heart healthy Follow up - in 2 weeks Activity - WBAT Condition Upon Discharge - Good D/C Meds - See DC Summary DVT Prophylaxis - Xarelto Protocol   Seena Ritacco 11/08/2011, 12:43 PM

## 2011-11-08 NOTE — Progress Notes (Signed)
Physical Therapy Treatment Patient Details Name: Colin Ryan MRN: [id number] DOB: [birth year] Today's Date: 11/08/2011 4098-1191 PT Assessment/Plan  PT - Assessment/Plan Comments on Treatment Session: pt continues w/  c/o decreased control of pain. rec. pt not DC today to improve in safety, pain control and functional mobility. RN aware. PT Plan: Discharge plan remains appropriate;Frequency remains appropriate Follow Up Recommendations: Home health PT;Supervision/Assistance - 24 hour Equipment Recommended: None recommended by PT PT Goals  Acute Rehab PT Goals Time For Goal Achievement: 7 days Pt will go Supine/Side to Sit: with supervision PT Goal: Supine/Side to Sit - Progress: Progressing toward goal Pt will go Sit to Supine/Side: with supervision PT Goal: Sit to Supine/Side - Progress: Progressing toward goal Pt will go Sit to Stand: with supervision PT Goal: Sit to Stand - Progress: Progressing toward goal Pt will go Stand to Sit: with supervision PT Goal: Stand to Sit - Progress: Progressing toward goal Pt will Ambulate: >150 feet;with supervision;with least restrictive assistive device PT Goal: Ambulate - Progress: Progressing toward goal Pt will Perform Home Exercise Program: with supervision, verbal cues required/provided PT Goal: Perform Home Exercise Program - Progress: Progressing toward goal  PT Treatment Precautions/Restrictions  Precautions Precautions: Knee Required Braces or Orthoses: Yes Knee Immobilizer: Discontinue once straight leg raise with < 10 degree lag Restrictions Weight Bearing Restrictions: No Mobility (including Balance) Bed Mobility Supine to Sit: 4: Min assist;With rails;HOB flat Sit to Supine: 4: Min assist;HOB flat Sit to Supine - Details (indicate cue type and reason): assist for RLE onto bed Transfers Sit to Stand: 4: Min assist;From bed;With upper extremity assist Sit to Stand Details (indicate cue type and reason): vc for hand  placement Stand to Sit: 4: Min assist;To bed;With upper extremity assist Stand to Sit Details: vc to reach back, step out RLE Ambulation/Gait Ambulation/Gait Assistance: 4: Min assist Ambulation/Gait Assistance Details (indicate cue type and reason):  steady assist for transfers, pt did not want to walk due to pain Ambulation Distance (Feet): 5 Feet Assistive device: Rolling walker Gait Pattern: Decreased stance time - right;Step-to pattern    Exercise  Total Joint Exercises Quad Sets: AROM;Right;10 reps Heel Slides: AAROM;Right;10 reps Hip ABduction/ADduction: AAROM;Right;10 reps Straight Leg Raises: AAROM;Right;10 reps End of Session PT - End of Session Equipment Utilized During Treatment: Right knee immobilizer Activity Tolerance: Patient limited by pain Patient left: in chair;with call bell in reach Nurse Communication: Mobility status for transfers (pain issues) General Behavior During Session: [facility] for tasks performed  Rada Hay 11/08/2011, 4:55 PM

## 2011-11-08 NOTE — Progress Notes (Signed)
CARE MANAGEMENT NOTE 11/08/2011  Patient:  TOREN, TUCHOLSKI   Account Number:  [id number]  Date Initiated:  11/08/2011  Documentation initiated by:  Colleen Can  Subjective/Objective Assessment:   DX Osteoarthritis right knee; total knee replacemnt     Action/Plan:   Cm spoke with patient. Patient plans to return to his home in [place] where spouse will be caqregiver. Already has DME at home. Wants [facility] for [facility] services when offered choice. Prearranged with Genev[facility] Norlander prior to admission   Anticipated DC Date:  11/08/2011   Anticipated DC Plan:  HOME W HOME HEALTH SERVICES  In-house referral  NA      DC Planning Services  CM consult      [facility] Choice  HOME HEALTH   Choice offered to / List presented to:  C-1 Patient   DME arranged  NA      DME agency  NA     HH arranged  HH-2 PT      [facility] agency  [facility]   Status of service:  Completed, signed off Medicare Important Message given?  NO (If response is "NO", the following Medicare IM given date fields will be blank) Date Medicare IM given:   Date Additional Medicare IM given:    Discharge Disposition:  HOME W HOME HEALTH SERVICES  Comments:  11/08/2011 Raynelle Bring BSN CCM [phone] List of [facility] agency choices placed in shadow chart. [facility] will start services day after pt is discharged

## 2011-11-08 NOTE — Progress Notes (Signed)
Physical Therapy Treatment Patient Details Name: Colin Ryan MRN: [id number] DOB: [birth year] Today's Date: 11/08/2011 4098-1191 PT Assessment/Plan  PT - Assessment/Plan Comments on Treatment Session: pt tolerated ambulation better this PM. Pt mildly SOB  sats 84 % RA and HR 101.  rec. pt will need further PT prior to DC to improve in mobility and pain control.  RN aware. PT Plan: Discharge plan remains appropriate Follow Up Recommendations: Home health PT;Supervision/Assistance - 24 hour Equipment Recommended: None recommended by PT PT Goals  Acute Rehab PT Goals Time For Goal Achievement: 7 days Pt will go Supine/Side to Sit: with supervision PT Goal: Supine/Side to Sit - Progress: Progressing toward goal Pt will go Sit to Supine/Side: with supervision PT Goal: Sit to Supine/Side - Progress: Progressing toward goal Pt will go Sit to Stand: with supervision PT Goal: Sit to Stand - Progress: Progressing toward goal Pt will go Stand to Sit: with supervision PT Goal: Stand to Sit - Progress: Progressing toward goal Pt will Ambulate: >150 feet;with supervision;with least restrictive assistive device PT Goal: Ambulate - Progress: Progressing toward goal Pt will Perform Home Exercise Program: with supervision, verbal cues required/provided PT Goal: Perform Home Exercise Program - Progress: Progressing toward goal  PT Treatment Precautions/Restrictions  Precautions Precautions: Knee Required Braces or Orthoses: Yes Knee Immobilizer: Discontinue once straight leg raise with < 10 degree lag Restrictions Weight Bearing Restrictions: No Mobility (including Balance) Bed Mobility Supine to Sit: 4: Min assist;With rails;HOB elevated (Comment degrees) Supine to Sit Details (indicate cue type and reason): pt used sheet to move RLE to EOB Sit to Supine: 4: Min assist;HOB flat Sit to Supine - Details (indicate cue type and reason): assist for RLE onto bed Transfers Sit to Stand: 4: Min  assist;From elevated surface;From bed Sit to Stand Details (indicate cue type and reason): vc for hand placement Stand to Sit: 4: Min assist;To bed;With upper extremity assist Stand to Sit Details: vc to reach back, step out RLE Ambulation/Gait Ambulation/Gait Assistance: 4: Min assist Ambulation/Gait Assistance Details (indicate cue type and reason): pt stated pain less at this time, tolerated better than AM session Ambulation Distance (Feet): 80 Feet Assistive device: Rolling walker Gait Pattern: Step-to pattern    Exercise  End of Session PT - End of Session Equipment Utilized During Treatment: Right knee immobilizer Activity Tolerance: Patient tolerated treatment well Patient left: in bed;with call bell in reach;with family/visitor present Nurse Communication: Mobility status for transfers General Behavior During Session: [facility] for tasks performed  Rada Hay 11/08/2011, 5:04 PM

## 2011-11-09 LAB — CBC
Hemoglobin: 11.8 g/dL — ABNORMAL LOW (ref 13.0–17.0)
RBC: 3.82 MIL/uL — ABNORMAL LOW (ref 4.22–5.81)
WBC: 9.2 10*3/uL (ref 4.0–10.5)

## 2011-11-09 NOTE — Progress Notes (Signed)
Physical Therapy Treatment Patient Details Name: Colin Ryan MRN: [id number] DOB: 1951/04/24 Today's Date: 11/09/2011 927-949 PT Assessment/Plan  PT - Assessment/Plan Comments on Treatment Session: pt w/ c/o less paintoday. pt is ready for DC PT Goals  Acute Rehab PT Goals Pt will go Supine/Side to Sit: with supervision PT Goal: Supine/Side to Sit - Progress: Met Pt will go Sit to Supine/Side: with supervision PT Goal: Sit to Supine/Side - Progress: Met Pt will go Sit to Stand: with supervision PT Goal: Sit to Stand - Progress: Met Pt will go Stand to Sit: with supervision PT Goal: Stand to Sit - Progress: Met Pt will Ambulate: >150 feet;with supervision;with least restrictive assistive device PT Goal: Ambulate - Progress: Progressing toward goal Pt will Go Up / Down Stairs: 1-2 stairs;with least restrictive assistive device PT Goal: Up/Down Stairs - Progress: Met Pt will Perform Home Exercise Program: with supervision, verbal cues required/provided PT Goal: Perform Home Exercise Program - Progress: Progressing toward goal  PT Treatment Precautions/Restrictions  Precautions Precautions: Knee Required Braces or Orthoses: Yes Knee Immobilizer: Discontinue once straight leg raise with < 10 degree lag Restrictions Weight Bearing Restrictions: Yes Mobility (including Balance) Bed Mobility Supine to Sit: 5: Supervision;HOB elevated (Comment degrees) Supine to Sit Details (indicate cue type and reason): used sheet to help move LLE to EOB Transfers Sit to Stand: 4: Min assist Sit to Stand Details (indicate cue type and reason): minguard from raised bed Stand to Sit: 4: Min assist;To bed;To elevated surface Ambulation/Gait Ambulation/Gait Assistance: 4: Min assist Ambulation/Gait Assistance Details (indicate cue type and reason): minguard, gait sequence improved w/ decr. pain Ambulation Distance (Feet): 100 Feet Assistive device: Rolling walker Gait Pattern: Step-to  pattern Stairs: Yes Stairs Assistance: 4: Min assist Stairs Assistance Details (indicate cue type and reason): support of RW on steps, vc fro technique Stair Management Technique: With walker;Backwards Number of Stairs: 3  Height of Stairs: 6     Exercise  Total Joint Exercises Ankle Circles/Pumps: AROM;10 reps;Right Quad Sets: AROM;Right;10 reps Heel Slides: AAROM;Right;10 reps Hip ABduction/ADduction: AAROM;Right;10 reps Straight Leg Raises: AAROM;Right;10 reps End of Session PT - End of Session Equipment Utilized During Treatment: Right knee immobilizer Activity Tolerance: Patient tolerated treatment well Patient left: in bed;with call bell in reach;with family/visitor present Nurse Communication: Mobility status for transfers General Behavior During Session: [facility] for tasks performed  Rada Hay 11/09/2011, 12:36 PM

## 2011-11-09 NOTE — Progress Notes (Signed)
Subjective: 3 Days Post-Op Procedure(s) (LRB): TOTAL KNEE ARTHROPLASTY (Right) Patient reports pain as mild.   Patient seen in rounds with Dr. Lequita Halt. Patient doing well and ready to go home.  Objective: Vital signs in last 24 hours: Temp:  [98.2 F (36.8 C)-98.4 F (36.9 C)] 98.4 F (36.9 C) (03/21 0630) Pulse Rate:  [73-95] 73  (03/21 0630) Resp:  [16-19] 19  (03/21 0630) BP: (134-145)/(76-82) 145/78 mmHg (03/21 0630) SpO2:  [91 %-94 %] 94 % (03/21 0630)  Intake/Output from previous day:  Intake/Output Summary (Last 24 hours) at 11/09/11 1037 Last data filed at 11/09/11 0830  Gross per 24 hour  Intake    720 ml  Output   2450 ml  Net  -1730 ml    Intake/Output this shift: Total I/O In: 240 [P.O.:240] Out: -   Labs:  Basename 11/09/11 0420 11/08/11 0422 11/07/11 0443  HGB 11.8* 11.9* 13.6    Basename 11/09/11 0420 11/08/11 0422  WBC 9.2 11.4*  RBC 3.82* 3.85*  HCT 36.1* 36.6*  PLT 197 189    Basename 11/08/11 0422 11/07/11 1245  NA 136 137  K 4.8 5.0  CL 101 98  CO2 27 29  BUN 22 29*  CREATININE 0.98 1.13  GLUCOSE 110* 185*  CALCIUM 8.3* 8.8   No results found for this basename: LABPT:2,INR:2 in the last 72 hours  Exam: Neurovascular intact Sensation intact distally Incision - clean, dry, no drainage, healing Motor function intact - moving foot and toes well on exam.   Assessment/Plan: 3 Days Post-Op Procedure(s) (LRB): TOTAL KNEE ARTHROPLASTY (Right) Procedure(s) (LRB): TOTAL KNEE ARTHROPLASTY (Right) Past Medical History  Diagnosis Date  . MI (myocardial infarction)   . Arthritis   . Hypertension     LOV with EKG Dr Herbie Baltimore 11/12, clearance with note 2/13,   eccho 5/12, stress test 11/09  on chart       chest x ray 7/12 EPIC  . Hyperlipidemia   . Sleep apnea     STOP BANG SCORE 4   Principal Problem:  *OA (osteoarthritis) of knee Active Problems:  Postop Hyperkalemia  Postop Hyponatremia   Up with therapy Discharge home with  home health Diet - heart healthy Follow up - in 2 weeks, the week of April 1-5 Activity - WBAT Condition Upon Discharge - Good D/C Meds - See DC Summary DVT Prophylaxis - Xarelto Protocol   [name] 11/09/2011, 10:37 AM

## 2011-11-09 NOTE — Discharge Summary (Signed)
Physician Discharge Summary   Patient ID: DURANT SCIBILIA MRN: [id number] DOB/AGE: May 19, 1951 61 y.o.  Admit date: 11/06/2011 Discharge date: 11/09/2011  Primary Diagnosis: Osteoarthritis Right Knee  Admission Diagnoses: Past Medical History  Diagnosis Date  . MI (myocardial infarction)   . Arthritis   . Hypertension     LOV with EKG Dr Herbie Baltimore 11/12, clearance with note 2/13,   eccho 5/12, stress test 11/09  on chart       chest x ray 7/12 EPIC  . Hyperlipidemia   . Sleep apnea     STOP BANG SCORE 4   Discharge Diagnoses:  Principal Problem:  *OA (osteoarthritis) of knee Active Problems:  Postop Hyperkalemia  Postop Hyponatremia  Procedure: Procedure(s) (LRB): TOTAL KNEE ARTHROPLASTY (Right)   Consults: None  HPI: Colin Ryan is a 61 y.o. year old male with end stage OA of his right knee with progressively worsening pain and dysfunction. He has constant pain, with activity and at rest and significant functional deficits with difficulties even with ADLs. He has had extensive non-op management including analgesics, injections of cortisone and viscosupplements, and home exercise program, but remains in significant pain with significant dysfunction. Radiographs demonstrate bone on bone arthritis medially and patellofemoral with tibial subchondral sclerosis. He presents now for right Total Knee Arthroplasty.   Laboratory Data: Hospital Outpatient Visit on 10/30/2011  Component Date Value Range Status  . aPTT (seconds) 10/30/2011 32  24-37 Final  . WBC (K/uL) 10/30/2011 6.5  4.0-10.5 Final  . RBC (MIL/uL) 10/30/2011 5.05  4.22-5.81 Final  . Hemoglobin (g/dL) 40/98/1191 47.8  29.5-62.1 Final  . HCT (%) 10/30/2011 46.4  39.0-52.0 Final  . MCV (fL) 10/30/2011 91.9  78.0-100.0 Final  . MCH (pg) 10/30/2011 31.3  26.0-34.0 Final  . MCHC (g/dL) 30/86/5784 69.6  29.5-28.4 Final  . RDW (%) 10/30/2011 13.0  11.5-15.5 Final  . Platelets (K/uL) 10/30/2011 223  150-400 Final  . Sodium  (mEq/L) 10/30/2011 137  135-145 Final  . Potassium (mEq/L) 10/30/2011 4.5  3.5-5.1 Final  . Chloride (mEq/L) 10/30/2011 102  96-112 Final  . CO2 (mEq/L) 10/30/2011 26  19-32 Final  . Glucose, Bld (mg/dL) 13/24/4010 272* 53-66 Final  . BUN (mg/dL) 44/10/4740 17  5-95 Final  . Creatinine, Ser (mg/dL) 63/87/5643 3.29  5.18-8.41 Final  . Calcium (mg/dL) 66/01/3015 9.5  0.1-09.3 Final  . Total Protein (g/dL) 23/55/7322 7.3  0.2-5.4 Final  . Albumin (g/dL) 27/01/2375 4.0  2.8-3.1 Final  . AST (U/L) 10/30/2011 17  0-37 Final  . ALT (U/L) 10/30/2011 24  0-53 Final  . Alkaline Phosphatase (U/L) 10/30/2011 121* 39-117 Final  . Total Bilirubin (mg/dL) 51/76/1607 0.6  3.7-1.0 Final  . GFR calc non Af Amer (mL/min) 10/30/2011 89* >90 Final  . GFR calc Af Amer (mL/min) 10/30/2011 >90  >90 Final   Comment:                                 The eGFR has been calculated                          using the CKD EPI equation.                          This calculation has not been  validated in all clinical                          situations.                          eGFR's persistently                          <90 mL/min signify                          possible Chronic Kidney Disease.  [name] Prothrombin Time (seconds) 10/30/2011 13.5  11.6-15.2 Final  . INR  10/30/2011 1.01  0.00-1.49 Final  . Color, Urine  10/30/2011 YELLOW  YELLOW Final  . APPearance  10/30/2011 CLEAR  CLEAR Final  . Specific Gravity, Urine  10/30/2011 1.020  1.005-1.030 Final  . pH  10/30/2011 6.0  5.0-8.0 Final  . Glucose, UA (mg/dL) 16/05/9603 NEGATIVE  NEGATIVE Final  . Hgb urine dipstick  10/30/2011 NEGATIVE  NEGATIVE Final  . Bilirubin Urine  10/30/2011 NEGATIVE  NEGATIVE Final  . Ketones, ur (mg/dL) 54/04/8118 NEGATIVE  NEGATIVE Final  . Protein, ur (mg/dL) 14/78/2956 NEGATIVE  NEGATIVE Final  . Urobilinogen, UA (mg/dL) 21/30/8657 0.2  8.4-6.9 Final  . Nitrite  10/30/2011 NEGATIVE  NEGATIVE Final  .  Leukocytes, UA  10/30/2011 NEGATIVE  NEGATIVE Final   MICROSCOPIC NOT DONE ON URINES WITH NEGATIVE PROTEIN, BLOOD, LEUKOCYTES, NITRITE, OR GLUCOSE <1000 mg/dL.  [name] MRSA, PCR  10/30/2011 NEGATIVE  NEGATIVE Final  . Staphylococcus aureus  10/30/2011 NEGATIVE  NEGATIVE Final   Comment:                                 The Xpert SA Assay (FDA                          approved for NASAL specimens                          only), is one component of                          a comprehensive surveillance                          program.  It is not intended                          to diagnose infection nor to                          guide or monitor treatment.    Basename 11/09/11 0420 11/08/11 0422 11/07/11 0443  HGB 11.8* 11.9* 13.6    Basename 11/09/11 0420 11/08/11 0422  WBC 9.2 11.4*  RBC 3.82* 3.85*  HCT 36.1* 36.6*  PLT 197 189    Basename 11/08/11 0422 11/07/11 1245  NA 136 137  K 4.8 5.0  CL 101 98  CO2 27 29  BUN 22 29*  CREATININE 0.98 1.13  GLUCOSE 110* 185*  CALCIUM 8.3* 8.8   No results found for this basename: LABPT:2,INR:2 in the last 72 hours  X-Rays:No results found.  EKG:No orders found for this or any previous visit.   Hospital Course: Patient was admitted to [facility] and taken to the OR and underwent the above state procedure without complications.  Patient tolerated the procedure well and was later transferred to the recovery room and then to the orthopaedic floor for postoperative care.  They were given PO and IV analgesics for pain control following their surgery.  They were given 24 hours of postoperative antibiotics and started on DVT prophylaxis.   PT and OT were ordered for total joint protocol.  Discharge planning consulted to help with postop disposition and equipment needs.  Patient had a good night on the evening of surgery and started to get up with therapy on day one.  PCA Morphine was discontinued and they were weaned over to PO meds.   Hemovac drain was pulled without difficulty.  Continued to progress with therapy into day two.  Dressing was changed on day two and the incision was healing well.  It was thought he might be ready to go home later that evening so discharge was setup.  He had a fair amount of pain that evening due to the level of his activity so we kept him another day.  By day three, the patient had progressed with therapy and meeting goals.  Incision was healing well.  Patient was seen in rounds and was ready to go home.  Discharge Medications: Prior to Admission medications   Medication Sig Start Date End Date Taking? Authorizing Provider  metoprolol succinate (TOPROL-XL) 100 MG 24 hr tablet Take 100 mg by mouth at bedtime. Take with or immediately following a meal.   Yes Historical Provider, MD  ramipril (ALTACE) 5 MG capsule Take 5 mg by mouth 2 (two) times daily.    Yes Historical Provider, MD  rosuvastatin (CRESTOR) 10 MG tablet Take 10 mg by mouth at bedtime.    Yes Historical Provider, MD  methocarbamol (ROBAXIN) 500 MG tablet Take 1 tablet (500 mg total) by mouth every 6 (six) hours as needed. 11/08/11 11/18/11  Danarius Mcconathy, PA  oxyCODONE (OXY IR/ROXICODONE) 5 MG immediate release tablet Take 1-2 tablets (5-10 mg total) by mouth every 4 (four) hours as needed. 11/08/11 11/18/11  Jerimey Burridge Julien Girt, PA  rivaroxaban (XARELTO) 10 MG TABS tablet Take 1 tablet (10 mg total) by mouth daily with breakfast. 11/08/11   Topacio Cella Julien Girt, PA    Diet: heart healthy Activity:WBAT Follow-up:in 2 weeks, the week of April 1-5 Disposition: Home Discharged Condition: good   Discharge Orders    Future Orders Please Complete By Expires   Diet - low sodium heart healthy      Call MD / Call 911      Comments:   If you experience chest pain or shortness of breath, CALL 911 and be transported to the hospital emergency room.  If you develope a fever above 101 F, pus (white drainage) or increased drainage or redness  at the wound, or calf pain, call your surgeon's office.   Constipation Prevention      Comments:   Drink plenty of fluids.  Prune juice may be helpful.  You may use a stool softener, such as Colace (over the counter) 100 mg twice a day.  Use MiraLax (over the counter) for constipation as needed.   Increase activity slowly as tolerated      Weight Bearing as taught in Physical Therapy      Comments:   Use a walker  or crutches as instructed.   Discharge instructions      Comments:   Pick up stool softner and laxative for home. Do not submerge incision under water. May shower. Continue to use ice for pain and swelling from surgery.    Driving restrictions      Comments:   No driving   Lifting restrictions      Comments:   No lifting   TED hose      Comments:   Use stockings (TED hose) for 3 weeks on both leg(s).  You may remove them at night for sleeping.   Change dressing      Comments:   Change dressing daily with sterile 4 x 4 inch gauze dressing and apply TED hose.   Do not put a pillow under the knee. Place it under the heel.        Medication List  As of 11/09/2011 10:41 AM   STOP taking these medications         aspirin 81 MG chewable tablet      Aspirin-Caffeine 845-65 MG Pack      HYDROcodone-acetaminophen 5-500 MG per tablet         TAKE these medications         methocarbamol 500 MG tablet   Commonly known as: ROBAXIN   Take 1 tablet (500 mg total) by mouth every 6 (six) hours as needed.      metoprolol succinate 100 MG 24 hr tablet   Commonly known as: TOPROL-XL   Take 100 mg by mouth at bedtime. Take with or immediately following a meal.      oxyCODONE 5 MG immediate release tablet   Commonly known as: Oxy IR/ROXICODONE   Take 1-2 tablets (5-10 mg total) by mouth every 4 (four) hours as needed.      ramipril 5 MG capsule   Commonly known as: ALTACE   Take 5 mg by mouth 2 (two) times daily.      rivaroxaban 10 MG Tabs tablet   Commonly known as:  XARELTO   Take 1 tablet (10 mg total) by mouth daily with breakfast.      rosuvastatin 10 MG tablet   Commonly known as: CRESTOR   Take 10 mg by mouth at bedtime.           Follow-up Information    Follow up with Loanne Drilling, MD. Schedule an appointment as soon as possible for a visit in 2 weeks. (Follow up the week of April 1-5.)    Contact information:   [facility] [address] [place] [address] [phone]          Signed: Patrica Duel 11/09/2011, 10:41 AM

## 2011-11-15 ENCOUNTER — Encounter (HOSPITAL_COMMUNITY): Payer: Self-pay | Admitting: Orthopedic Surgery

## 2012-07-23 ENCOUNTER — Ambulatory Visit (INDEPENDENT_AMBULATORY_CARE_PROVIDER_SITE_OTHER): Payer: BC Managed Care – PPO | Admitting: General Surgery

## 2012-07-23 ENCOUNTER — Encounter (INDEPENDENT_AMBULATORY_CARE_PROVIDER_SITE_OTHER): Payer: Self-pay | Admitting: General Surgery

## 2012-07-23 VITALS — BP 128/84 | HR 72 | Temp 97.6°F | Resp 16 | Ht 73.0 in | Wt 272.5 lb

## 2012-07-23 DIAGNOSIS — K429 Umbilical hernia without obstruction or gangrene: Secondary | ICD-10-CM

## 2012-07-23 NOTE — Progress Notes (Signed)
Subjective:     Patient ID: Colin Ryan, male   DOB: Dec 30, 1950, 61 y.o.   MRN: [id number]  HPI The patient is a 61 year old white male who has had a bulge at his bellybutton for a number of years. At times it seemed larger than at others. He's had some mild discomfort associated with it. He denies any nausea or vomiting. He denies any abdominal pain. His appetite has been good and his bowels are working normally. I actually fixed a similar hernia on his brother about a year ago when he did well.  Review of Systems  Constitutional: Negative.   HENT: Negative.   Eyes: Negative.   Respiratory: Negative.   Cardiovascular: Negative.   Gastrointestinal: Negative.   Genitourinary: Negative.   Musculoskeletal: Negative.   Skin: Negative.   Neurological: Negative.   Hematological: Negative.   Psychiatric/Behavioral: Negative.        Objective:   Physical Exam  Constitutional: He is oriented to person, place, and time. He appears well-developed and well-nourished.  HENT:  Head: Normocephalic and atraumatic.  Eyes: Conjunctivae normal and EOM are normal. Pupils are equal, round, and reactive to light.  Neck: Normal range of motion. Neck supple.  Cardiovascular: Normal rate, regular rhythm and normal heart sounds.   Pulmonary/Chest: Effort normal and breath sounds normal.  Abdominal: Soft. Bowel sounds are normal.       There is a moderate-sized umbilical hernia that partially reduces. It feels as though there is some fat within the hernia. There is no abnormal distention. There is no dominant pain.  Musculoskeletal: Normal range of motion.  Neurological: He is alert and oriented to person, place, and time.  Skin: Skin is warm and dry.  Psychiatric: He has a normal mood and affect. His behavior is normal.       Assessment:     The patient has a moderate-sized umbilical hernia. Because of the risk of incarceration strenuousness think she would benefit from having this fixed. He would  also like to have this done. I discussed with him in detail the risks and benefits of the operation to fix the hernia as well as some of the technical aspects including the use of mesh and he understands and wishes to proceed    Plan:     Given his history of coronary artery bypass surgery we will plan to get cardiac clearance prior to scheduling his surgery. We will plan for umbilical hernia repair with mesh.

## 2012-07-23 NOTE — Patient Instructions (Signed)
Plan for umbilical hernia repair with mesh after cardiac clearance

## 2012-07-25 ENCOUNTER — Telehealth (INDEPENDENT_AMBULATORY_CARE_PROVIDER_SITE_OTHER): Payer: Self-pay | Admitting: General Surgery

## 2012-07-25 NOTE — Telephone Encounter (Signed)
LMOM asking pt to return my call.  This is so that I may inform him that I received his cardiac clearance from Dr. Herbie Baltimore and that he said it was okay for the patient to stop his Aspirin 5 days before surgery.

## 2012-07-26 ENCOUNTER — Encounter (INDEPENDENT_AMBULATORY_CARE_PROVIDER_SITE_OTHER): Payer: Self-pay

## 2012-07-26 NOTE — Telephone Encounter (Signed)
Spoke with pt and explained the reason for the earlier phone call.  He said he understood.

## 2012-08-02 ENCOUNTER — Telehealth (INDEPENDENT_AMBULATORY_CARE_PROVIDER_SITE_OTHER): Payer: Self-pay | Admitting: General Surgery

## 2012-08-02 NOTE — Telephone Encounter (Signed)
LMOM letting pt know we rescheduled his first PO appt to 1/21 at 11:10 due to his surgery date being pushed back.

## 2012-08-09 ENCOUNTER — Encounter (HOSPITAL_COMMUNITY): Payer: Self-pay | Admitting: Pharmacy Technician

## 2012-08-19 ENCOUNTER — Encounter (HOSPITAL_COMMUNITY)
Admission: RE | Admit: 2012-08-19 | Discharge: 2012-08-19 | Disposition: A | Discharge status: Home / Self Care | Payer: BC Managed Care – PPO | Source: Ambulatory Visit | Attending: General Surgery | Admitting: General Surgery

## 2012-08-19 ENCOUNTER — Ambulatory Visit (HOSPITAL_COMMUNITY)
Admission: RE | Admit: 2012-08-19 | Discharge: 2012-08-19 | Disposition: A | Discharge status: Home / Self Care | Payer: BC Managed Care – PPO | Source: Ambulatory Visit | Attending: Anesthesiology | Admitting: Anesthesiology

## 2012-08-19 ENCOUNTER — Encounter (HOSPITAL_COMMUNITY): Payer: Self-pay

## 2012-08-19 DIAGNOSIS — K469 Unspecified abdominal hernia without obstruction or gangrene: Secondary | ICD-10-CM | POA: Insufficient documentation

## 2012-08-19 DIAGNOSIS — I10 Essential (primary) hypertension: Secondary | ICD-10-CM | POA: Insufficient documentation

## 2012-08-19 DIAGNOSIS — Z01818 Encounter for other preprocedural examination: Secondary | ICD-10-CM | POA: Insufficient documentation

## 2012-08-19 HISTORY — DX: Anxiety disorder, unspecified: F41.9

## 2012-08-19 HISTORY — DX: Gastro-esophageal reflux disease without esophagitis: K21.9

## 2012-08-19 LAB — CBC
HCT: 47.1 % (ref 39.0–52.0)
Hemoglobin: 15.7 g/dL (ref 13.0–17.0)
MCH: 31 pg (ref 26.0–34.0)
MCV: 93.1 fL (ref 78.0–100.0)
RBC: 5.06 MIL/uL (ref 4.22–5.81)

## 2012-08-19 LAB — BASIC METABOLIC PANEL
CO2: 24 mEq/L (ref 19–32)
Chloride: 102 mEq/L (ref 96–112)
Glucose, Bld: 119 mg/dL — ABNORMAL HIGH (ref 70–99)
Potassium: 4.4 mEq/L (ref 3.5–5.1)
Sodium: 138 mEq/L (ref 135–145)

## 2012-08-19 NOTE — Progress Notes (Signed)
I faxe a rerquest to [facility] cardiology, requesting last office notes, EKG and stress test and ECHO>

## 2012-08-19 NOTE — Pre-Procedure Instructions (Addendum)
20 Colin Ryan  08/19/2012   Your procedure is scheduled on: Thursday, January 2nd.  Report to [facility] at 10:45 AM.  Call this number if you have problems the morning of surgery: [phone]   Remember: Nothing to eat or drink after Midnight.   Take these medicines the morning of surgery with A SIP OF WATER: Metoprolol (Toprol XL).  Take Nitroglycerin if needed.       Stop taking Aspirin, Coumadin, Plavix, Effient and Herbal medications.  Do not take any NSAIDs ie: Ibuprofen,  Advil,Naproxen or any medication containing Aspirin.   Do not wear jewelry, make-up or nail polish.  Do not wear lotions, powders, or perfumes. You may wear deodorant.  Do not shave 48 hours prior to surgery. Men may shave face and neck.  Do not bring valuables to the hospital.  Contacts, dentures or bridgework may not be worn into surgery.  Leave suitcase in the car. After surgery it may be brought to your room.  For patients admitted to the hospital, checkout time is 11:00 AM the day of discharge.   Patients discharged the day of surgery will not be allowed to drive home.  Name and phone number of your driver: NA  Special Instructions: Shower using CHG 2 nights before surgery and the night before surgery.  If you shower the day of surgery use CHG.  Use special wash - you have one bottle of CHG for all showers.  You should use approximately 1/3 of the bottle for each shower.   Please read over the following fact sheets that you were given: Pain Booklet, Coughing and Deep Breathing and Surgical Site Infection Prevention

## 2012-08-20 ENCOUNTER — Encounter (INDEPENDENT_AMBULATORY_CARE_PROVIDER_SITE_OTHER): Payer: BC Managed Care – PPO | Admitting: General Surgery

## 2012-08-21 MED ORDER — DEXTROSE 5 % IV SOLN: 3.0000 g | INTRAVENOUS | Status: DC

## 2012-08-22 ENCOUNTER — Encounter (HOSPITAL_COMMUNITY): Payer: Self-pay | Admitting: Anesthesiology

## 2012-08-22 ENCOUNTER — Encounter (HOSPITAL_COMMUNITY)
Admission: RE | Disposition: A | Discharge status: Home / Self Care | Payer: Self-pay | Source: Ambulatory Visit | Attending: General Surgery

## 2012-08-22 ENCOUNTER — Encounter (HOSPITAL_COMMUNITY): Payer: Self-pay

## 2012-08-22 ENCOUNTER — Ambulatory Visit (HOSPITAL_BASED_OUTPATIENT_CLINIC_OR_DEPARTMENT_OTHER)
Admission: RE | Admit: 2012-08-22 | Discharge: 2012-08-22 | Disposition: A | Discharge status: Home / Self Care | Payer: BC Managed Care – PPO | Source: Ambulatory Visit | Attending: General Surgery | Admitting: General Surgery

## 2012-08-22 ENCOUNTER — Ambulatory Visit (HOSPITAL_COMMUNITY): Payer: BC Managed Care – PPO | Admitting: Anesthesiology

## 2012-08-22 DIAGNOSIS — J449 Chronic obstructive pulmonary disease, unspecified: Secondary | ICD-10-CM | POA: Insufficient documentation

## 2012-08-22 DIAGNOSIS — I2510 Atherosclerotic heart disease of native coronary artery without angina pectoris: Secondary | ICD-10-CM | POA: Insufficient documentation

## 2012-08-22 DIAGNOSIS — K219 Gastro-esophageal reflux disease without esophagitis: Secondary | ICD-10-CM | POA: Insufficient documentation

## 2012-08-22 DIAGNOSIS — I10 Essential (primary) hypertension: Secondary | ICD-10-CM | POA: Insufficient documentation

## 2012-08-22 DIAGNOSIS — K429 Umbilical hernia without obstruction or gangrene: Secondary | ICD-10-CM | POA: Insufficient documentation

## 2012-08-22 DIAGNOSIS — I252 Old myocardial infarction: Secondary | ICD-10-CM | POA: Insufficient documentation

## 2012-08-22 DIAGNOSIS — J4489 Other specified chronic obstructive pulmonary disease: Secondary | ICD-10-CM | POA: Insufficient documentation

## 2012-08-22 HISTORY — PX: UMBILICAL HERNIA REPAIR: SHX196

## 2012-08-22 SURGERY — REPAIR, HERNIA, UMBILICAL, ADULT
Anesthesia: General | Site: Abdomen | Wound class: Clean Contaminated

## 2012-08-22 MED ORDER — DEXTROSE 5 % IV SOLN
3.0000 g | INTRAVENOUS | Status: DC | PRN
  Administered 2012-08-22: 3 g via INTRAVENOUS

## 2012-08-22 MED ORDER — BUPIVACAINE-EPINEPHRINE PF 0.25-1:200000 % IJ SOLN
INTRAMUSCULAR | Status: AC
  Filled 2012-08-22: qty 30

## 2012-08-22 MED ORDER — PHENYLEPHRINE HCL 10 MG/ML IJ SOLN
INTRAMUSCULAR | Status: DC | PRN
  Administered 2012-08-22: 120 ug via INTRAVENOUS
  Administered 2012-08-22: 80 ug via INTRAVENOUS

## 2012-08-22 MED ORDER — NEOSTIGMINE METHYLSULFATE 1 MG/ML IJ SOLN
INTRAMUSCULAR | Status: DC | PRN
  Administered 2012-08-22: 3 mg via INTRAVENOUS

## 2012-08-22 MED ORDER — MIDAZOLAM HCL 5 MG/5ML IJ SOLN
INTRAMUSCULAR | Status: DC | PRN
  Administered 2012-08-22: 2 mg via INTRAVENOUS

## 2012-08-22 MED ORDER — LACTATED RINGERS IV SOLN
INTRAVENOUS | Status: DC
  Administered 2012-08-22: 15:00:00 via INTRAVENOUS

## 2012-08-22 MED ORDER — HYDROCODONE-ACETAMINOPHEN 5-325 MG PO TABS: 1.0000 | ORAL_TABLET | Freq: Four times a day (QID) | ORAL | Status: DC | PRN

## 2012-08-22 MED ORDER — FENTANYL CITRATE 0.05 MG/ML IJ SOLN
INTRAMUSCULAR | Status: DC | PRN
  Administered 2012-08-22: 50 ug via INTRAVENOUS
  Administered 2012-08-22: 100 ug via INTRAVENOUS

## 2012-08-22 MED ORDER — OXYCODONE HCL 5 MG PO TABS
5.0000 mg | ORAL_TABLET | Freq: Once | ORAL | Status: AC | PRN
  Administered 2012-08-22: 5 mg via ORAL

## 2012-08-22 MED ORDER — CEFAZOLIN SODIUM 1-5 GM-% IV SOLN
INTRAVENOUS | Status: AC
  Filled 2012-08-22: qty 50

## 2012-08-22 MED ORDER — ROCURONIUM BROMIDE 100 MG/10ML IV SOLN
INTRAVENOUS | Status: DC | PRN
  Administered 2012-08-22: 50 mg via INTRAVENOUS

## 2012-08-22 MED ORDER — CHLORHEXIDINE GLUCONATE 4 % EX LIQD: 1.0000 "application " | Freq: Once | CUTANEOUS | Status: DC

## 2012-08-22 MED ORDER — OXYCODONE HCL 5 MG/5ML PO SOLN: 5.0000 mg | Freq: Once | ORAL | Status: AC | PRN

## 2012-08-22 MED ORDER — GLYCOPYRROLATE 0.2 MG/ML IJ SOLN
INTRAMUSCULAR | Status: DC | PRN
  Administered 2012-08-22: 0.4 mg via INTRAVENOUS

## 2012-08-22 MED ORDER — HYDROMORPHONE HCL PF 1 MG/ML IJ SOLN
INTRAMUSCULAR | Status: AC
  Filled 2012-08-22: qty 1

## 2012-08-22 MED ORDER — OXYCODONE HCL 5 MG PO TABS
ORAL_TABLET | ORAL | Status: AC
  Administered 2012-08-22: 5 mg via ORAL
  Filled 2012-08-22: qty 1

## 2012-08-22 MED ORDER — PROPOFOL 10 MG/ML IV BOLUS
INTRAVENOUS | Status: DC | PRN
  Administered 2012-08-22: 200 mg via INTRAVENOUS

## 2012-08-22 MED ORDER — DEXAMETHASONE SODIUM PHOSPHATE 4 MG/ML IJ SOLN
INTRAMUSCULAR | Status: DC | PRN
  Administered 2012-08-22: 4 mg via INTRAVENOUS

## 2012-08-22 MED ORDER — ONDANSETRON HCL 4 MG/2ML IJ SOLN
INTRAMUSCULAR | Status: DC | PRN
  Administered 2012-08-22: 4 mg via INTRAVENOUS

## 2012-08-22 MED ORDER — HYDROMORPHONE HCL PF 1 MG/ML IJ SOLN
0.2500 mg | INTRAMUSCULAR | Status: DC | PRN
  Administered 2012-08-22: 0.25 mg via INTRAVENOUS

## 2012-08-22 MED ORDER — METOCLOPRAMIDE HCL 5 MG/ML IJ SOLN: 10.0000 mg | Freq: Once | INTRAMUSCULAR | Status: DC | PRN

## 2012-08-22 MED ORDER — BUPIVACAINE-EPINEPHRINE 0.25% -1:200000 IJ SOLN
INTRAMUSCULAR | Status: DC | PRN
  Administered 2012-08-22: 30 mL

## 2012-08-22 MED ORDER — LIDOCAINE HCL (CARDIAC) 20 MG/ML IV SOLN
INTRAVENOUS | Status: DC | PRN
  Administered 2012-08-22: 100 mg via INTRAVENOUS

## 2012-08-22 MED ORDER — 0.9 % SODIUM CHLORIDE (POUR BTL) OPTIME
TOPICAL | Status: DC | PRN
  Administered 2012-08-22: 1000 mL

## 2012-08-22 MED ORDER — CEFAZOLIN SODIUM-DEXTROSE 2-3 GM-% IV SOLR
INTRAVENOUS | Status: AC
  Filled 2012-08-22: qty 50

## 2012-08-22 SURGICAL SUPPLY — 44 items
ADH SKN CLS APL DERMABOND .7 (GAUZE/BANDAGES/DRESSINGS) ×2
APL SKNCLS STERI-STRIP NONHPOA (GAUZE/BANDAGES/DRESSINGS)
BENZOIN TINCTURE PRP APPL 2/3 (GAUZE/BANDAGES/DRESSINGS) IMPLANT
BLADE SURG 15 STRL LF DISP TIS (BLADE) ×2 IMPLANT
BLADE SURG 15 STRL SS (BLADE) ×3
BLADE SURG ROTATE 9660 (MISCELLANEOUS) ×2 IMPLANT
CHLORAPREP W/TINT 26ML (MISCELLANEOUS) ×3 IMPLANT
CLOTH BEACON ORANGE TIMEOUT ST (SAFETY) ×3 IMPLANT
COVER MAYO STAND STRL (DRAPES) ×3 IMPLANT
COVER TABLE BACK 60X90 (DRAPES) ×3 IMPLANT
DECANTER SPIKE VIAL GLASS SM (MISCELLANEOUS) ×1 IMPLANT
DERMABOND ADVANCED (GAUZE/BANDAGES/DRESSINGS) ×1
DERMABOND ADVANCED .7 DNX12 (GAUZE/BANDAGES/DRESSINGS) ×2 IMPLANT
DRAPE LAPAROTOMY TRNSV 102X78 (DRAPE) ×3 IMPLANT
DRAPE UTILITY XL STRL (DRAPES) ×5 IMPLANT
DRSG TEGADERM 4X4.75 (GAUZE/BANDAGES/DRESSINGS) IMPLANT
ELECT COATED BLADE 2.86 ST (ELECTRODE) ×3 IMPLANT
ELECT REM PT RETURN 9FT ADLT (ELECTROSURGICAL) ×3
ELECTRODE REM PT RTRN 9FT ADLT (ELECTROSURGICAL) ×2 IMPLANT
GAUZE SPONGE 4X4 12PLY STRL LF (GAUZE/BANDAGES/DRESSINGS) IMPLANT
GLOVE BIO SURGEON STRL SZ7.5 (GLOVE) ×3 IMPLANT
GOWN PREVENTION PLUS XLARGE (GOWN DISPOSABLE) ×3 IMPLANT
NDL HYPO 25X1 1.5 SAFETY (NEEDLE) ×1 IMPLANT
NEEDLE HYPO 22GX1.5 SAFETY (NEEDLE) ×2 IMPLANT
NEEDLE HYPO 25X1 1.5 SAFETY (NEEDLE) IMPLANT
NS IRRIG 1000ML POUR BTL (IV SOLUTION) IMPLANT
PACK BASIN DAY SURGERY FS (CUSTOM PROCEDURE TRAY) ×3 IMPLANT
PENCIL BUTTON HOLSTER BLD 10FT (ELECTRODE) ×3 IMPLANT
SLEEVE SCD COMPRESS KNEE MED (MISCELLANEOUS) IMPLANT
SPONGE LAP 18X18 X RAY DECT (DISPOSABLE) ×3 IMPLANT
STRIP CLOSURE SKIN 1/2X4 (GAUZE/BANDAGES/DRESSINGS) IMPLANT
SUT MON AB 4-0 PC3 18 (SUTURE) ×3 IMPLANT
SUT NOVA NAB DX-16 0-1 5-0 T12 (SUTURE) ×3 IMPLANT
SUT SILK 3 0 SH 30 (SUTURE) IMPLANT
SUT VIC AB 2-0 SH 27 (SUTURE) ×3
SUT VIC AB 2-0 SH 27XBRD (SUTURE) ×2 IMPLANT
SUT VIC AB 3-0 54X BRD REEL (SUTURE) IMPLANT
SUT VIC AB 3-0 BRD 54 (SUTURE)
SUT VIC AB 3-0 SH 27 (SUTURE) ×3
SUT VIC AB 3-0 SH 27X BRD (SUTURE) ×1 IMPLANT
SYR CONTROL 10ML LL (SYRINGE) ×3 IMPLANT
TOWEL OR 17X24 6PK STRL BLUE (TOWEL DISPOSABLE) ×6 IMPLANT
TOWEL OR NON WOVEN STRL DISP B (DISPOSABLE) ×3 IMPLANT
WATER STERILE IRR 1000ML POUR (IV SOLUTION) ×1 IMPLANT

## 2012-08-22 NOTE — Transfer of Care (Signed)
Immediate Anesthesia Transfer of Care Note  Patient: Colin Ryan  Procedure(s) Performed: Procedure(s) (LRB) with comments: HERNIA REPAIR UMBILICAL ADULT (N/A) - umbilical hernia repair   Patient Location: PACU  Anesthesia Type:General  Level of Consciousness: awake, alert  and oriented  Airway & Oxygen Therapy: Patient Spontanous Breathing and Patient connected to nasal cannula oxygen  Post-op Assessment: Report given to PACU RN, Post -op Vital signs reviewed and stable and Patient moving all extremities X 4  Post vital signs: Reviewed and stable  Complications: No apparent anesthesia complications

## 2012-08-22 NOTE — Op Note (Signed)
08/22/2012  4:30 PM  PATIENT:  Colin Ryan  62 y.o. male  PRE-OPERATIVE DIAGNOSIS:  umbilical hernia  POST-OPERATIVE DIAGNOSIS:  umbilical hernia  PROCEDURE:  Procedure(s) (LRB) with comments: HERNIA REPAIR UMBILICAL ADULT (N/A) - umbilical hernia repair   SURGEON:  Surgeon(s) and Role:    * Robyne Askew, MD - Primary  PHYSICIAN ASSISTANT:   ASSISTANTS: none   ANESTHESIA:   general  EBL:  Total I/O In: 800 [I.V.:800] Out: 5 [Urine:5]  BLOOD ADMINISTERED:none  DRAINS: none   LOCAL MEDICATIONS USED:  MARCAINE     SPECIMEN:  No Specimen  DISPOSITION OF SPECIMEN:  N/A  COUNTS:  YES  TOURNIQUET:  * No tourniquets in log *  DICTATION: .Dragon Dictation After informed consent was obtained patient was brought to the operating room placed in the supine position the operating table. After adequate induction of general anesthesia the patient's abdomen was prepped with ChloraPrep, lab draw, and draped in usual sterile manner. The area around the umbilicus was infiltrated with quarter percent Marcaine. A small transversely oriented incision was made with a 15 blade knife at the inferior edge of the umbilicus. This incision was carried through the skin and subcutaneous tissue sharply with the electrocautery. The hernia sac was identified and opened sharply with the cautery. There was only some omental fat within the hernia. Some of this fat was excised sharply with the electrocautery. The rest of the fat was reduced without difficulty through a very small fascial defect. The fascial defect was too small to get the tip of a finger through. The fascial edges of the hernia were healthy. Since we could not get a finger through the opening we decided to repair the defect primarily. We closed the fascial defect with interrupted #1 Novafil stitches. The wound was irrigated with copious amounts of saline. The subcutaneous tissue was then closed with interrupted 2-0 Vicryl stitches. The skin was  closed with interrupted 4-0 Monocryl subcutaneous stitches. Dermabond dressings were applied. The patient tolerated the procedure well. At the end of the distal needle sponge and instrument counts were correct. The patient was awakened and taken to recovery in stable condition.  PLAN OF CARE: Discharge to home after PACU  PATIENT DISPOSITION:  PACU - hemodynamically stable.   Delay start of Pharmacological VTE agent (>24hrs) due to surgical blood loss or risk of bleeding: not applicable

## 2012-08-22 NOTE — H&P (View-Only) (Signed)
Subjective:     Patient ID: Colin Ryan, male   DOB: 04/23/1951, 61 y.o.   MRN: [id number]  HPI The patient is a 61-year-old white male who has had a bulge at his bellybutton for a number of years. At times it seemed larger than at others. He's had some mild discomfort associated with it. He denies any nausea or vomiting. He denies any abdominal pain. His appetite has been good and his bowels are working normally. I actually fixed a similar hernia on his brother about a year ago when he did well.  Review of Systems  Constitutional: Negative.   HENT: Negative.   Eyes: Negative.   Respiratory: Negative.   Cardiovascular: Negative.   Gastrointestinal: Negative.   Genitourinary: Negative.   Musculoskeletal: Negative.   Skin: Negative.   Neurological: Negative.   Hematological: Negative.   Psychiatric/Behavioral: Negative.        Objective:   Physical Exam  Constitutional: He is oriented to person, place, and time. He appears well-developed and well-nourished.  HENT:  Head: Normocephalic and atraumatic.  Eyes: Conjunctivae normal and EOM are normal. Pupils are equal, round, and reactive to light.  Neck: Normal range of motion. Neck supple.  Cardiovascular: Normal rate, regular rhythm and normal heart sounds.   Pulmonary/Chest: Effort normal and breath sounds normal.  Abdominal: Soft. Bowel sounds are normal.       There is a moderate-sized umbilical hernia that partially reduces. It feels as though there is some fat within the hernia. There is no abnormal distention. There is no dominant pain.  Musculoskeletal: Normal range of motion.  Neurological: He is alert and oriented to person, place, and time.  Skin: Skin is warm and dry.  Psychiatric: He has a normal mood and affect. His behavior is normal.       Assessment:     The patient has a moderate-sized umbilical hernia. Because of the risk of incarceration strenuousness think she would benefit from having this fixed. He would  also like to have this done. I discussed with him in detail the risks and benefits of the operation to fix the hernia as well as some of the technical aspects including the use of mesh and he understands and wishes to proceed    Plan:     Given his history of coronary artery bypass surgery we will plan to get cardiac clearance prior to scheduling his surgery. We will plan for umbilical hernia repair with mesh.      

## 2012-08-22 NOTE — Interval H&P Note (Signed)
History and Physical Interval Note:  08/22/2012 3:13 PM  Colin Ryan  has presented today for surgery, with the diagnosis of umbilical hernia  The various methods of treatment have been discussed with the patient and family. After consideration of risks, benefits and other options for treatment, the patient has consented to  Procedure(s) (LRB) with comments: HERNIA REPAIR UMBILICAL ADULT (N/A) - umbilical hernia repair with mesh INSERTION OF MESH (N/A) as a surgical intervention .  The patient's history has been reviewed, patient examined, no change in status, stable for surgery.  I have reviewed the patient's chart and labs.  Questions were answered to the patient's satisfaction.     TOTH III,Tagen S

## 2012-08-22 NOTE — Anesthesia Postprocedure Evaluation (Signed)
Anesthesia Post Note  Patient: Colin Ryan  Procedure(s) Performed: Procedure(s) (LRB): HERNIA REPAIR UMBILICAL ADULT (N/A)  Anesthesia type: general  Patient location: PACU  Post pain: Pain level controlled  Post assessment: Patient's Cardiovascular Status Stable  Last Vitals:  Filed Vitals:   08/22/12 1730  BP: 133/86  Pulse: 55  Temp:   Resp: 18    Post vital signs: Reviewed and stable  Level of consciousness: sedated  Complications: No apparent anesthesia complications

## 2012-08-22 NOTE — Anesthesia Preprocedure Evaluation (Addendum)
Anesthesia Evaluation  Patient identified by MRN, date of birth, ID band Patient awake    Reviewed: Allergy & Precautions, H&P , NPO status , Patient's Chart, lab work & pertinent test results, reviewed documented beta blocker date and time   Airway Mallampati: II TM Distance: >3 FB Neck ROM: full    Dental  (+) Dental Advisory Given   Pulmonary shortness of breath, COPD breath sounds clear to auscultation        Cardiovascular hypertension, Pt. on home beta blockers + CAD and + Past MI Rhythm:regular     Neuro/Psych PSYCHIATRIC DISORDERS Anxiety negative neurological ROS     GI/Hepatic Neg liver ROS, GERD-  ,  Endo/Other  negative endocrine ROS  Renal/GU negative Renal ROS  negative genitourinary   Musculoskeletal   Abdominal   Peds  Hematology negative hematology ROS (+)   Anesthesia Other Findings See surgeon's H&P   Reproductive/Obstetrics negative OB ROS                          Anesthesia Physical Anesthesia Plan  ASA: III  Anesthesia Plan: General   Post-op Pain Management:    Induction: Intravenous  Airway Management Planned: Oral ETT  Additional Equipment:   Intra-op Plan:   Post-operative Plan: Extubation in OR  Informed Consent: I have reviewed the patients History and Physical, chart, labs and discussed the procedure including the risks, benefits and alternatives for the proposed anesthesia with the patient or authorized representative who has indicated his/her understanding and acceptance.   Dental advisory given  Plan Discussed with: Anesthesiologist and Surgeon  Anesthesia Plan Comments:         Anesthesia Quick Evaluation

## 2012-08-22 NOTE — Preoperative (Signed)
Beta Blockers   Reason not to administer Beta Blockers:Not Applicable 

## 2012-08-22 NOTE — Anesthesia Procedure Notes (Signed)
Procedure Name: Intubation Date/Time: 08/22/2012 3:45 PM Performed by: Elon Alas Pre-anesthesia Checklist: Patient identified, Timeout performed, Emergency Drugs available, Suction available and Patient being monitored Patient Re-evaluated:Patient Re-evaluated prior to inductionOxygen Delivery Method: Circle system utilized Preoxygenation: Pre-oxygenation with 100% oxygen Intubation Type: IV induction Ventilation: Mask ventilation without difficulty and Oral airway inserted - appropriate to patient size Laryngoscope Size: Mac and 4 Grade View: Grade I Tube type: Oral Tube size: 7.5 mm Number of attempts: 1 Airway Equipment and Method: Stylet Placement Confirmation: ETT inserted through vocal cords under direct vision,  breath sounds checked- equal and bilateral and positive ETCO2 Secured at: 23 cm Tube secured with: Tape Dental Injury: Teeth and Oropharynx as per pre-operative assessment

## 2012-08-26 ENCOUNTER — Encounter (HOSPITAL_COMMUNITY): Payer: Self-pay | Admitting: General Surgery

## 2012-09-10 ENCOUNTER — Ambulatory Visit (INDEPENDENT_AMBULATORY_CARE_PROVIDER_SITE_OTHER): Payer: BC Managed Care – PPO | Admitting: General Surgery

## 2012-09-10 ENCOUNTER — Encounter (INDEPENDENT_AMBULATORY_CARE_PROVIDER_SITE_OTHER): Payer: Self-pay | Admitting: General Surgery

## 2012-09-10 VITALS — BP 150/82 | HR 68 | Temp 98.0°F | Resp 20 | Ht 74.0 in | Wt 280.0 lb

## 2012-09-10 DIAGNOSIS — K429 Umbilical hernia without obstruction or gangrene: Secondary | ICD-10-CM

## 2012-09-10 NOTE — Patient Instructions (Signed)
No heavy lifting for another 3 weeks 

## 2012-09-10 NOTE — Progress Notes (Signed)
Subjective:     Patient ID: Colin Ryan, male   DOB: 02-28-1951, 62 y.o.   MRN: [id number]  HPI The patient is a 62 year old white male who is about 3 weeks status post umbilical hernia repair with mesh. He denies any abdominal pain. His appetite is good his bowels are working normally.  Review of Systems     Objective:   Physical Exam On exam his abdomen is soft and nontender. His incision is healing nicely with no sign of infection. There is no palpable evidence for recurrence of the hernia.    Assessment:     3 weeks status post umbilical hernia repair with mesh    Plan:     At this point I would like him to continue to refrain from any heavy lifting for another 3 weeks. We will see him back in one month to check his progress

## 2012-10-14 ENCOUNTER — Ambulatory Visit (INDEPENDENT_AMBULATORY_CARE_PROVIDER_SITE_OTHER): Payer: Medicare Other | Admitting: General Surgery

## 2012-10-14 ENCOUNTER — Encounter (INDEPENDENT_AMBULATORY_CARE_PROVIDER_SITE_OTHER): Payer: Medicare Other | Admitting: General Surgery

## 2012-10-14 ENCOUNTER — Encounter (INDEPENDENT_AMBULATORY_CARE_PROVIDER_SITE_OTHER): Payer: Self-pay | Admitting: General Surgery

## 2012-10-14 VITALS — BP 152/88 | HR 82 | Temp 97.5°F | Resp 18 | Ht 74.0 in | Wt 283.0 lb

## 2012-10-14 NOTE — Progress Notes (Signed)
Subjective:     Patient ID: Colin Ryan, male   DOB: Apr 24, 1951, 62 y.o.   MRN: [id number]  HPI The patient is a 62 year old white male who is about 7 weeks status post umbilical hernia repair. He is doing well and denies any pain. His appetite is good and his bowels are working normally. He is ready to get back to the gym.  Review of Systems     Objective:   Physical Exam On exam his abdomen is soft and nontender. His incision has healed nicely with no palpable evidence of recurrence of the hernia.    Assessment:     The patient is 7 weeks status post umbilical hernia repair     Plan:     At this point I think he can return to his normal activities without any restrictions. We will plan to see him back on a when necessary basis.

## 2012-10-14 NOTE — Patient Instructions (Signed)
May return to all normal activities 

## 2012-10-30 ENCOUNTER — Other Ambulatory Visit (HOSPITAL_COMMUNITY): Payer: Self-pay | Admitting: Cardiology

## 2012-11-12 ENCOUNTER — Ambulatory Visit (HOSPITAL_COMMUNITY)
Admission: RE | Admit: 2012-11-12 | Discharge: 2012-11-12 | Disposition: A | Discharge status: Home / Self Care | Payer: BC Managed Care – PPO | Source: Ambulatory Visit | Attending: Cardiology | Admitting: Cardiology

## 2012-11-12 DIAGNOSIS — Z9861 Coronary angioplasty status: Secondary | ICD-10-CM | POA: Insufficient documentation

## 2012-11-12 DIAGNOSIS — Z8249 Family history of ischemic heart disease and other diseases of the circulatory system: Secondary | ICD-10-CM | POA: Insufficient documentation

## 2012-11-12 DIAGNOSIS — R0602 Shortness of breath: Secondary | ICD-10-CM | POA: Insufficient documentation

## 2012-11-12 DIAGNOSIS — I252 Old myocardial infarction: Secondary | ICD-10-CM | POA: Insufficient documentation

## 2012-11-12 DIAGNOSIS — R0609 Other forms of dyspnea: Secondary | ICD-10-CM | POA: Insufficient documentation

## 2012-11-12 DIAGNOSIS — R0989 Other specified symptoms and signs involving the circulatory and respiratory systems: Secondary | ICD-10-CM | POA: Insufficient documentation

## 2012-11-12 DIAGNOSIS — Z951 Presence of aortocoronary bypass graft: Secondary | ICD-10-CM

## 2012-11-12 DIAGNOSIS — I25810 Atherosclerosis of coronary artery bypass graft(s) without angina pectoris: Secondary | ICD-10-CM | POA: Insufficient documentation

## 2012-11-12 DIAGNOSIS — R42 Dizziness and giddiness: Secondary | ICD-10-CM | POA: Insufficient documentation

## 2012-11-12 HISTORY — PX: NM MYOVIEW LTD: HXRAD82

## 2012-11-12 MED ORDER — REGADENOSON 0.4 MG/5ML IV SOLN
0.4000 mg | Freq: Once | INTRAVENOUS | Status: AC
  Administered 2012-11-12: 0.4 mg via INTRAVENOUS

## 2012-11-12 MED ORDER — TECHNETIUM TC 99M SESTAMIBI GENERIC - CARDIOLITE: 1445.0000 | Freq: Once | INTRAVENOUS | Status: DC | PRN

## 2012-11-12 MED ORDER — TECHNETIUM TC 99M SESTAMIBI GENERIC - CARDIOLITE
11.0000 | Freq: Once | INTRAVENOUS | Status: AC | PRN
  Administered 2012-11-12: 11 via INTRAVENOUS

## 2012-11-12 MED ORDER — TECHNETIUM TC 99M SESTAMIBI GENERIC - CARDIOLITE
30.0000 | Freq: Once | INTRAVENOUS | Status: AC | PRN
  Administered 2012-11-12: 30 via INTRAVENOUS

## 2012-11-12 NOTE — Procedures (Addendum)
[facility] [address] [place] [address] [place] [address] [phone]  Cardiology Nuclear Med Study  Colin Ryan is a 62 y.o. male     MRN : [id number]     DOB: 1951-06-03  Procedure Date: 11/12/2012  Nuclear Med Background Indication for Stress Test:  Graft Patency History:  CAD;MI-2001;CABG-X3-11/06/2001;STENT-2001,2002,2003;PTCA Cardiac Risk Factors: Family History - CAD, History of Smoking, Hypertension, Lipids and Obesity  Symptoms:  Chest Pain, Dizziness, DOE and SOB   Nuclear Pre-Procedure Caffeine/Decaff Intake:  1:00am NPO After: 11 AM   IV Site: R Hand  IV 0.9% NS with Angio Cath:  22g  Chest Size (in):  52"  IV Started by: Emmit Pomfret, RN  Height: 6\' 1"  (1.854 m)  Cup Size: n/a  BMI:  Body mass index is 37.08 kg/(m^2). Weight:  281 lb (127.461 kg)   Tech Comments:  N/A    Nuclear Med Study 1 or 2 day study: 1 day  Stress Test Type:  Lexiscan  Order Authorizing Provider:  DAVID Varshini Arrants, MD   Resting Radionuclide: Technetium 56m Sestamibi  Resting Radionuclide Dose: 11.0 mCi   Stress Radionuclide:  Technetium 44m Sestamibi  Stress Radionuclide Dose: 30.0 mCi           Stress Protocol Rest HR: 65 Stress HR: 87  Rest BP: 124/86 Stress BP: 127/88  Exercise Time (min): n/a METS: n/a          Dose of Adenosine (mg):  n/a Dose of Lexiscan: 0.4 mg  Dose of Atropine (mg): n/a Dose of Dobutamine: n/a mcg/kg/min (at max HR)  Stress Test Technologist: [name], CCT Nuclear Technologist: [name], CNMT   Rest Procedure:  Myocardial perfusion imaging was performed at rest 45 minutes following the intravenous administration of Technetium 81m Sestamibi. Stress Procedure:  The patient received IV Lexiscan 0.4 mg over 15-seconds.  Technetium 70m Sestamibi injected at 30-seconds.  There were no significant changes with Lexiscan.  Quantitative spect images were obtained after a 45 minute delay.  Transient Ischemic  Dilatation (Normal <1.22):  1.15 Lung/Heart Ratio (Normal <0.45):  0.31 QGS EDV:  116 ml QGS ESV:  53 ml LV Ejection Fraction: 55%  Rest ECG: NSR with incomplete RBBB  Stress ECG: No significant change from baseline ECG  QPS Raw Data Images:  There is interference from nuclear activity from structures below the diaphragm. This does not affect the ability to read the study. Stress Images:  There is decreased uptake in the inferior wall. Rest Images:  There is decreased uptake in the inferior wall. Subtraction (SDS):  No evidence of ischemia.   Impression Exercise Capacity:  Lexiscan with no exercise. BP Response:  Normal blood pressure response. Clinical Symptoms:  No significant symptoms noted. ECG Impression:  No significant ECG changes with Lexiscan. Comparison with Prior Nuclear Study: No significant change from previous study in 2009.  Overall Impression:  Low risk stress nuclear study. Fixed inferior and apical defects consistent with scar.  LV Wall Motion:  LVEF 55%, mild inferoapical hypokinesis.  [name], MD, [facility] Board Certified in Nuclear Cardiology Attending Cardiologist [facility]  [name], MD  11/12/2012 5:16 PM
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
## 2013-03-18 ENCOUNTER — Telehealth: Payer: Self-pay | Admitting: Cardiology

## 2013-03-18 NOTE — Telephone Encounter (Signed)
Dr. Herbie Baltimore notified and advised Guaifenisin and Dextromethorphan are okay to take.  Avoid Pseudoephedrine.  Returned call and spoke w/ pt.  Informed Mucinex or Mucinex DM are okay and to avoid Mucinex D or anything w/ pseudoephedrine.  Pt stated he has Mucinex 600 mg at home.  Pt advised to take that for his cough and to also increase his water intake to help w/ loosening secretions.  Pt also advised to use cough gtts or hard candy to help suppress cough prn and to try taking hot shower or stand in bathroom w/ steam from hot shower before going to bed.  Pt verbalized understanding and agreed w/ plan.

## 2013-03-18 NOTE — Telephone Encounter (Signed)
Wants to know if he can take Musinex? Has a cough-wants to know if this will interrfer with any of the medicine he is taking?i

## 2013-06-10 ENCOUNTER — Telehealth: Payer: Self-pay | Admitting: Cardiology

## 2013-06-10 MED ORDER — ROSUVASTATIN CALCIUM 10 MG PO TABS: 10.0000 mg | ORAL_TABLET | Freq: Every day | ORAL | Status: DC

## 2013-06-10 MED ORDER — METOPROLOL SUCCINATE ER 100 MG PO TB24: 100.0000 mg | ORAL_TABLET | Freq: Every day | ORAL | Status: DC

## 2013-06-10 MED ORDER — RAMIPRIL 5 MG PO CAPS: 5.0000 mg | ORAL_CAPSULE | Freq: Two times a day (BID) | ORAL | Status: DC

## 2013-06-10 NOTE — Telephone Encounter (Signed)
Returned call.  Left message to call back before 4pm.  

## 2013-06-10 NOTE — Telephone Encounter (Signed)
Please call-need to discuss his cholesterol medicine.i

## 2013-06-10 NOTE — Telephone Encounter (Signed)
Returning your call-just missed it.

## 2013-06-10 NOTE — Telephone Encounter (Signed)
Returned call to [place], pt's wife.  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  Stated they have a new plan, but there is a $200 deductible and they want to wait until next year.  Wife asked if Dr. Herbie Baltimore could send in Rxs for ramipril, metoprolol and Crestor for 30 days w/ 2 refills.  Unsure if pt will be able to afford Crestor b/c it will cost $50 each month.  Wife informed refills will be sent to [facility] per request and that samples of Crestor are available, but pt can only get one month at a time.  Advised she or pt call back each month when pt starts 4th sample packet to check for available samples.  Wife verbalized understanding and very thankful.    Refill(s) sent to pharmacy and samples left at front desk.  Lot: [id number] Exp: 10/2015.

## 2013-07-02 ENCOUNTER — Telehealth: Payer: Self-pay | Admitting: Cardiology

## 2013-07-02 MED ORDER — ROSUVASTATIN CALCIUM 10 MG PO TABS: 10.0000 mg | ORAL_TABLET | Freq: Every day | ORAL | Status: DC

## 2013-07-02 NOTE — Telephone Encounter (Signed)
Would like some samples of Crestor 10 mg for a month please.

## 2013-07-02 NOTE — Telephone Encounter (Signed)
Returned call and [name] informed samples left at front desk.  Verbalized understanding and agreed w/ plan.

## 2013-07-30 ENCOUNTER — Telehealth: Payer: Self-pay | Admitting: Cardiology

## 2013-07-30 MED ORDER — ROSUVASTATIN CALCIUM 10 MG PO TABS: 10.0000 mg | ORAL_TABLET | Freq: Every day | ORAL | Status: DC

## 2013-07-30 NOTE — Telephone Encounter (Signed)
Returned call.  Left message that one pack of samples available and will be left at front desk.  Also will leave discount card and to call back before 4pm if questions.   Call from [place], pt's wife and pt verified x 2.  Informed RN did call and left above message.  Verbalized understanding and will also call company to see if pt qualifies for further assistance if still too expensive w/ discount card.

## 2013-07-30 NOTE — Telephone Encounter (Signed)
She said she was told to call back and see if we had a month supply of  Crestor 1o mg please.

## 2013-08-13 ENCOUNTER — Telehealth: Payer: Self-pay | Admitting: Cardiology

## 2013-08-13 MED ORDER — ROSUVASTATIN CALCIUM 10 MG PO TABS: 10.0000 mg | ORAL_TABLET | Freq: Every day | ORAL | Status: DC

## 2013-08-13 NOTE — Telephone Encounter (Signed)
Something happen to computer-would not let me complete the documentation.Pt was on samples of Crestor, he would like for you to call in a prescription for it to  [facility]. Please.

## 2013-08-13 NOTE — Telephone Encounter (Signed)
Refill(s) sent to pharmacy.  Returned call and pt verified x 2 w/ [name], pt's wife.  Informed Rx sent to pharmacy for 30-day w/ 2 refills.  Verbalized understanding and agreed w/ plan.  Stated they are trying to find out which is cheaper with the mail order vs local pharmacy.

## 2013-08-13 NOTE — Telephone Encounter (Signed)
Pt have been on samples iof Crestor,would like for you to call him in a prescription for it.pre

## 2013-09-10 ENCOUNTER — Other Ambulatory Visit: Payer: Self-pay | Admitting: *Deleted

## 2013-09-10 MED ORDER — RAMIPRIL 5 MG PO CAPS: 5.0000 mg | ORAL_CAPSULE | Freq: Two times a day (BID) | ORAL | Status: DC

## 2013-10-08 ENCOUNTER — Other Ambulatory Visit: Payer: Self-pay | Admitting: *Deleted

## 2013-10-08 MED ORDER — METOPROLOL SUCCINATE ER 100 MG PO TB24: 100.0000 mg | ORAL_TABLET | Freq: Every day | ORAL | Status: DC

## 2013-10-08 NOTE — Telephone Encounter (Signed)
Rx was sent to pharmacy electronically. 

## 2013-10-22 ENCOUNTER — Other Ambulatory Visit: Payer: Self-pay

## 2013-10-22 MED ORDER — ROSUVASTATIN CALCIUM 10 MG PO TABS: 10.0000 mg | ORAL_TABLET | Freq: Every day | ORAL | Status: DC

## 2013-10-22 NOTE — Telephone Encounter (Signed)
Rx was sent to pharmacy electronically. 

## 2013-10-27 ENCOUNTER — Ambulatory Visit: Payer: BC Managed Care – PPO | Admitting: Cardiology

## 2013-12-03 ENCOUNTER — Other Ambulatory Visit: Payer: Self-pay | Admitting: *Deleted

## 2013-12-03 MED ORDER — METOPROLOL SUCCINATE ER 100 MG PO TB24: 100.0000 mg | ORAL_TABLET | Freq: Every day | ORAL | Status: DC

## 2013-12-04 ENCOUNTER — Other Ambulatory Visit: Payer: Self-pay | Admitting: *Deleted

## 2013-12-04 MED ORDER — RAMIPRIL 5 MG PO CAPS: 5.0000 mg | ORAL_CAPSULE | Freq: Two times a day (BID) | ORAL | Status: DC

## 2013-12-04 NOTE — Telephone Encounter (Signed)
Rx was sent to pharmacy electronically. 

## 2013-12-09 ENCOUNTER — Encounter: Payer: Self-pay | Admitting: *Deleted

## 2013-12-12 ENCOUNTER — Encounter: Payer: Self-pay | Admitting: Cardiology

## 2013-12-12 ENCOUNTER — Ambulatory Visit (INDEPENDENT_AMBULATORY_CARE_PROVIDER_SITE_OTHER): Payer: BC Managed Care – PPO | Admitting: Cardiology

## 2013-12-12 VITALS — BP 122/88 | HR 78 | Ht 73.0 in | Wt 289.7 lb

## 2013-12-12 DIAGNOSIS — I2510 Atherosclerotic heart disease of native coronary artery without angina pectoris: Secondary | ICD-10-CM

## 2013-12-12 DIAGNOSIS — E785 Hyperlipidemia, unspecified: Secondary | ICD-10-CM

## 2013-12-12 DIAGNOSIS — E669 Obesity, unspecified: Secondary | ICD-10-CM

## 2013-12-12 DIAGNOSIS — IMO0001 Reserved for inherently not codable concepts without codable children: Secondary | ICD-10-CM

## 2013-12-12 DIAGNOSIS — I2119 ST elevation (STEMI) myocardial infarction involving other coronary artery of inferior wall: Secondary | ICD-10-CM

## 2013-12-12 DIAGNOSIS — I10 Essential (primary) hypertension: Secondary | ICD-10-CM

## 2013-12-12 DIAGNOSIS — Z951 Presence of aortocoronary bypass graft: Secondary | ICD-10-CM

## 2013-12-12 MED ORDER — RAMIPRIL 5 MG PO CAPS: 5.0000 mg | ORAL_CAPSULE | Freq: Two times a day (BID) | ORAL | Status: DC

## 2013-12-12 MED ORDER — ROSUVASTATIN CALCIUM 10 MG PO TABS: 10.0000 mg | ORAL_TABLET | Freq: Every day | ORAL | Status: DC

## 2013-12-12 NOTE — Patient Instructions (Signed)
Continue current medication  Your physician wants you to follow-up in 12 months Dr Ellyn Hack.  You will receive a reminder letter in the mail two months in advance. If you don't receive a letter, please call our office to schedule the follow-up appointment.

## 2013-12-13 ENCOUNTER — Encounter: Payer: Self-pay | Admitting: Cardiology

## 2013-12-13 DIAGNOSIS — IMO0001 Reserved for inherently not codable concepts without codable children: Secondary | ICD-10-CM | POA: Insufficient documentation

## 2013-12-13 DIAGNOSIS — I10 Essential (primary) hypertension: Secondary | ICD-10-CM | POA: Insufficient documentation

## 2013-12-13 DIAGNOSIS — E785 Hyperlipidemia, unspecified: Secondary | ICD-10-CM | POA: Insufficient documentation

## 2013-12-13 NOTE — Assessment & Plan Note (Signed)
After discussing his coronary disease and symptoms, and we then spent at least 15-20 minutes talking about the importance of dietary modifications, adopting more of a Mediterranean style diet with more for the vegetables and less animal fats and proteins to for the importance of focusing and getting back into his exercise routine. He needs to lose the 10th of his body weight in the first year. This should put him at about 250 pounds at his next followup in a year.

## 2013-12-13 NOTE — Assessment & Plan Note (Signed)
I think the Myoview from November 2009 read as diaphragmatic attenuation was probably incorrectly read as diaphragmatic attenuation was more likely a true infarct. Reviewing the images, they seem to appear quite similar from 2009 to 2014. Since there is no evidence of ischemia noted, and a preserved ejection fraction, not inclined to follow up any further in the absence of symptoms.

## 2013-12-13 NOTE — Assessment & Plan Note (Signed)
His PCP is monitoring his lipid panel. He is pretty close to goal on current dose of Crestor. Hopefully with his knee in figure to work on his diet and improve his exercise level, he can make it it down to goal LDL less than 70. I would recommend checking a NMR panel with his next labs just to confirm accuracy of screening labs.

## 2013-12-13 NOTE — Assessment & Plan Note (Signed)
Well-controlled on current dose of ramipril and Toprol

## 2013-12-13 NOTE — Assessment & Plan Note (Signed)
Stable overall with no active anginal symptoms. He is on a beta blocker and ACE inhibitor as well as statin and aspirin. He has when necessary nitroglycerin which he has not used in several years. Stable for annual followup.

## 2013-12-13 NOTE — Assessment & Plan Note (Signed)
He had a routine followup stress test last year. I would probably not check another one for least another 3 years unless symptoms dictate.

## 2013-12-13 NOTE — Progress Notes (Signed)
PATIENT: Colin Ryan MRN: [id number]  DOB: Jan 26, 1951   DOV:12/13/2013 PCP: Pcp Not In System  Clinic Note: Chief Complaint  Patient presents with  . Annual Exam    no chest pain ,no sob , no edema    HPI: Colin Ryan is a 63 y.o.  male with a PMH below who presents today for one-year followup of CAD. He is a very pleasant gentleman with a history of CABG in 2003 for left main and RCA disease that was 2 years after his initial MI with PCI to the RCA. He then had PCI to the proximal LAD in 2002. He is moderately obese with hypertension, dyslipidemia and osteoarthritis. I last saw him in March of 2014 he was doing relatively well. He keeps doing a roller coaster ride up weights. He is currently 8 pounds up from last saw him. In 2012 he is waiting 260 pounds and currently weighing 289. He says he tried to work on his diet and exercise but keeps getting off the band wagon. He is otherwise been relatively stable since his CABG. He did have a LexiScan Myoview done last March after his last visit showing relatively preserved EF with inferoapical hypokinesis and an inferoapical scar that is consistent with known anatomy and prior RCA infarct.  Interval History: Since last visit, he just hasn't really been working very hard. He doesn't do much as far as exercise than just simply walking around. He denies any anginal chest pain or dyspnea with rest or exertion unless he really precipitate some dyspnea. He is more limited by his osteoporosis symptoms in his knees and hips. He denies any PND, orthopnea or edema. No rapid or irregular heartbeats, lightheadedness, dizziness, syncope/near syncope. No TIA/amaurosis fugax symptoms. No melena, hematochezia or hematuria. No epistaxis. No claudication.  Past Medical History  Diagnosis Date  . ST elevation myocardial infarction (STEMI) of inferior wall, subsequent episode of care 01/25/2000    100% mRCA  BMS 3.0 mm x 30 mm; 50% LAD, 80% D1  . CAD S/P percutaneous  coronary angioplasty 01/25/2000; 01/2001    INITIAL MI -->PCI to RCA;; 6/'02 Unstable Angina -- PCI-90% pLAD (BMS x 2) 2002; 2003 -->Ostial LM 50%, 90% D1 & AVGCx, 70% ISR in RCA  . Left main coronary artery disease 11/05/01    50+ percent Left Main; 90% D1 and AV groove circumflex, 70% RCA ISR  . S/P CABG x 3 11/06/01    LIMA-LAD, RIMA-RPDA, fLRad-OM  . Hypertension   . Dyslipidemia, goal LDL below 70     On statin. Monitored by PCP  . Obesity, Class II, BMI 35-39.9, with comorbidity   . Exertional dyspnea     Chronic  . Umbilical hernia   . GERD (gastroesophageal reflux disease)     Takes Alka-Seltzer PRN  . Basal cell carcinoma of cheek      left cheek  . Anxiety   . Arthritis     Prior Cardiac Evaluation and Past Surgical History: Past Surgical History  Procedure Laterality Date  . Tonsillectomy    . Joint replacement  02/2011    left knee  . Mastoid surgery      right ear as a child  . Colonoscopy    . Total knee arthroplasty  11/06/2011    Procedure: TOTAL KNEE ARTHROPLASTY;  Surgeon: Gearlean Alf, MD;  Location: [facility];  Service: Orthopedics;  Laterality: Right;  . Skin cancer excision    . Umbilical hernia repair  08/22/2012  Procedure: HERNIA REPAIR UMBILICAL ADULT;  Surgeon: Merrie Roof, MD;  Location: [place];  Service: General;  Laterality: N/A;  umbilical hernia repair   . Percutaneous coronary stent intervention (pci-s)  01/25/2000    PCI-RCA occlusion; BMS 3.0 mm x 30 mm  . Percutaneous coronary stent intervention (pci-s)  01/31/2011    Proximal LAD- 2 overlapping Velocity BMS stents 3.0 mm x 18 mm, 3.0 mm x 8 mm   . Nm myoview ltd  11/12/2012    INFEROPICAL SCAR  ,EF 55%  . Cardiac catheterization  11/05/2001     70% in stent  restenosis in RCA stent, 50+% distal  left main  diesae with patent LAD stent.   . Coronary artery bypass graft  11/06/01    LIMA-LAD, RIMA-RCA, [facility]-OM;   . Transthoracic echocardiogram  01/04/2011    Mild Anterior Hypokinesis, EF  50-55%, Mildly Dilaed Left Atrium, No Significant Valvular Lesions, Some Mld Aortic Sclerosis  . Nm myoview ltd  November 2009; 11/12/2012    No ischemia or infarction (anterior defect read as diaphragmatic attenuation;; March 2014: Inferior scar, EF 55% no ischemia    Allergies  Allergen Reactions  . Lipitor [Atorvastatin] Other (See Comments)    UNSPECIFIED.   [name] Zocor [Simvastatin] Other (See Comments)    UNSPECIFIED.     Current Outpatient Prescriptions  Medication Sig Dispense Refill  . aspirin 81 MG tablet Take 81 mg by mouth daily.      . metoprolol succinate (TOPROL-XL) 100 MG 24 hr tablet Take 1 tablet (100 mg total) by mouth at bedtime. Take with or immediately following a meal.  30 tablet  1  . nitroGLYCERIN (NITROSTAT) 0.4 MG SL tablet Place 0.4 mg under the tongue every 5 (five) minutes as needed. For chest pain      . ramipril (ALTACE) 5 MG capsule Take 1 capsule (5 mg total) by mouth 2 (two) times daily.  60 capsule  11  . rosuvastatin (CRESTOR) 10 MG tablet Take 1 tablet (10 mg total) by mouth at bedtime.  30 tablet  11   No current facility-administered medications for this visit.    History   Social History Narrative   He is a married father of 2.    Still trying to get back into exercise -- just has a hard time keeping up.   He does not drink, does not smoke; he quit smoking in 2003 shortly after his CABG.     ROS: A comprehensive Review of Systems - Negative except mild exertional dyspnea,, inability to keep weight off, osteoarthritis pains. most of his weight gain issues began when he had surgeries back in January of 2014 this was unable to exercise and point in time this is not going to back off again.  PHYSICAL EXAM BP 122/88  Pulse 78  Ht 6\' 1"  (1.854 m)  Wt 289 lb 11.2 oz (131.407 kg)  BMI 38.23 kg/m2 General appearance: alert, cooperative, appears stated age, no distress, moderately obese and Pleasant mood and affect. HEENT: [place]/AT, EOMI, MMM,  anicteric sclera Neck: no adenopathy, no carotid bruit, no JVD, supple, symmetrical, trachea midline and thyroid not enlarged, symmetric, no tenderness/mass/nodules Lungs: clear to auscultation bilaterally, normal percussion bilaterally and Nonlabored, good air movement. Heart: regular rate and rhythm, S1, S2 normal, no murmur, click, rub or gallop and normal apical impulse Abdomen: soft, non-tender; bowel sounds normal; no masses,  no organomegaly Extremities: extremities normal, atraumatic, no cyanosis or edema Pulses: 2+ and symmetric Neurologic: Alert  and oriented X 3, normal strength and tone. Normal symmetric reflexes. Normal coordination and gait   Adult ECG Report  Rate: 78 ;  Rhythm: normal sinus rhythm  QRS Axis: -59 ;  PR Interval: 174 ;  QRS Duration: 94 ; QTc: 419;  Voltages: Normal  Conduction Disturbances: left anterior fascicular block - versus left axis deviation with possible inferior infarct age undetermined  Other Abnormalities: none   Narrative Interpretation: Stable EKG  Recent Labs: 11/19/2013  138, potassium 4.6, chloride 104, bicarbonate 24, BUN/creatinine 15/1.09; glucose 125. Albumin 4.4, T. bili 0.9, alkaline phosphatase 93, AST 22.  TC 128, LDL 75, HDL 33, TG 102  ASSESSMENT / PLAN: H/O ST elevation myocardial infarction (STEMI) of inferior wall -- RCA; inferior infarct on Myoview. I think the Myoview from November 2009 read as diaphragmatic attenuation was probably incorrectly read as diaphragmatic attenuation was more likely a true infarct. Reviewing the images, they seem to appear quite similar from 2009 to 2014. Since there is no evidence of ischemia noted, and a preserved ejection fraction, not inclined to follow up any further in the absence of symptoms.   Coronary artery disease involving left main coronary artery - status post CABG x3 Stable overall with no active anginal symptoms. He is on a beta blocker and ACE inhibitor as well as statin and  aspirin. He has when necessary nitroglycerin which he has not used in several years. Stable for annual followup.  CORONARY ARTERY BYPASS GRAFT, HX OF He had a routine followup stress test last year. I would probably not check another one for least another 3 years unless symptoms dictate.    Dyslipidemia, goal LDL below 70 His PCP is monitoring his lipid panel. He is pretty close to goal on current dose of Crestor. Hopefully with his knee in figure to work on his diet and improve his exercise level, he can make it it down to goal LDL less than 70. I would recommend checking a NMR panel with his next labs just to confirm accuracy of screening labs.  Hypertension Well-controlled on current dose of ramipril and Toprol  Obesity, Class II, BMI 35-39.9, with comorbidity After discussing his coronary disease and symptoms, and we then spent at least 15-20 minutes talking about the importance of dietary modifications, adopting more of a Mediterranean style diet with more for the vegetables and less animal fats and proteins to for the importance of focusing and getting back into his exercise routine. He needs to lose the 10th of his body weight in the first year. This should put him at about 250 pounds at his next followup in a year.    Orders Placed This Encounter  Procedures  . EKG 12-Lead   refills Meds ordered this encounter  Medications  . rosuvastatin (CRESTOR) 10 MG tablet    Sig: Take 1 tablet (10 mg total) by mouth at bedtime.    Dispense:  30 tablet    Refill:  11  . ramipril (ALTACE) 5 MG capsule    Sig: Take 1 capsule (5 mg total) by mouth 2 (two) times daily.    Dispense:  60 capsule    Refill:  11    Followup: One year  Opel Lejeune W. Ellyn Hack, M.D., M.S. Interventional Cardiology [facility]

## 2013-12-16 ENCOUNTER — Telehealth: Payer: Self-pay | Admitting: *Deleted

## 2013-12-16 NOTE — Telephone Encounter (Signed)
ON 12/15/13 ROUTED OFFICE NOTE 12/12/13 THROUGH ---[facility]

## 2013-12-23 ENCOUNTER — Telehealth: Payer: Self-pay | Admitting: Cardiology

## 2013-12-23 NOTE — Telephone Encounter (Signed)
RN spoke to wife. Patient has applied to [employer] [place]. Patient had to indicate that he had a heart history.  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 Patient also has to write a letter stating why he cannot afford echo if it is not medical necessary Will bring release form to the office.  Will discuss with Dr Ellyn Hack.   Will contact patient/wife with answer.

## 2013-12-23 NOTE — Telephone Encounter (Signed)
Would like to speak with you about Dr. Ellyn Hack writing a letter for [name] .[name] Please call   Thanks

## 2013-12-23 NOTE — Telephone Encounter (Signed)
Left message on answering machine to call back.

## 2013-12-23 NOTE — Telephone Encounter (Signed)
He had a Myoview in 10/2012 - no ischemia; EF ~55%.  Would that suffice.  He never had reduced LVEF.  Just find out what I need to say & we can get a letter done.  Leonie Man, MD

## 2014-01-01 ENCOUNTER — Telehealth: Payer: Self-pay | Admitting: Cardiology

## 2014-01-01 NOTE — Telephone Encounter (Signed)
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

## 2014-01-01 NOTE — Telephone Encounter (Signed)
I Have talked to Dr. Ellyn Hack and explained to the pt. In my phone message to him that what Dr. Ellyn Hack needs is what to say in the letter

## 2014-01-02 ENCOUNTER — Encounter: Payer: Self-pay | Admitting: *Deleted

## 2014-01-02 NOTE — Telephone Encounter (Signed)
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

## 2014-01-02 NOTE — Telephone Encounter (Signed)
Pt. Called and informed that letter will need to signed and will be ready on wednesday

## 2014-01-07 ENCOUNTER — Encounter: Payer: Self-pay | Admitting: *Deleted

## 2014-01-07 ENCOUNTER — Telehealth: Payer: Self-pay | Admitting: Cardiology

## 2014-01-07 NOTE — Telephone Encounter (Signed)
Letter placed up front per pt. request

## 2014-01-07 NOTE — Telephone Encounter (Signed)
Pt said please do not mail the letter,please put it up front.

## 2014-01-09 ENCOUNTER — Telehealth: Payer: Self-pay | Admitting: Cardiology

## 2014-01-09 NOTE — Telephone Encounter (Signed)
Returned a call to patient's wife informing her that Colin Ryan is not in the office today and will not return before next Wednesday. He did not leave any instructions or information for [place] concerning her husbands letter.  Patient's wife states that this is not emergent and she will check again on next Wednesday.

## 2014-01-09 NOTE — Telephone Encounter (Signed)
Wants to know if Dr Ellyn Hack signed letter that JC was to re-do and get signed for pick up  Please call

## 2014-01-13 ENCOUNTER — Telehealth: Payer: Self-pay | Admitting: Cardiology

## 2014-01-13 NOTE — Telephone Encounter (Signed)
J C was suppose to be getting [place] a letter for the [facility].She wants to know if it is ready,theu need it.

## 2014-01-13 NOTE — Telephone Encounter (Signed)
Notified wife that letter is at front desk for pick up

## 2014-04-06 ENCOUNTER — Telehealth: Payer: Self-pay | Admitting: Cardiology

## 2014-04-06 MED ORDER — METOPROLOL SUCCINATE ER 100 MG PO TB24: 100.0000 mg | ORAL_TABLET | Freq: Every day | ORAL | Status: DC

## 2014-04-06 NOTE — Telephone Encounter (Signed)
Pt need new prescription for his Metoprolol 100 mg #30. Please call to [facility]

## 2014-04-06 NOTE — Telephone Encounter (Signed)
Rx refill sent to patient pharmacy   

## 2014-05-14 ENCOUNTER — Emergency Department (HOSPITAL_COMMUNITY): Payer: BC Managed Care – PPO

## 2014-05-14 ENCOUNTER — Emergency Department (HOSPITAL_COMMUNITY)
Admission: EM | Admit: 2014-05-14 | Discharge: 2014-05-14 | Disposition: A | Discharge status: Home / Self Care | Payer: BC Managed Care – PPO | Attending: Emergency Medicine | Admitting: Emergency Medicine

## 2014-05-14 ENCOUNTER — Encounter (HOSPITAL_COMMUNITY): Payer: Self-pay | Admitting: Emergency Medicine

## 2014-05-14 DIAGNOSIS — I2510 Atherosclerotic heart disease of native coronary artery without angina pectoris: Secondary | ICD-10-CM | POA: Diagnosis not present

## 2014-05-14 DIAGNOSIS — M542 Cervicalgia: Secondary | ICD-10-CM | POA: Diagnosis present

## 2014-05-14 DIAGNOSIS — I10 Essential (primary) hypertension: Secondary | ICD-10-CM | POA: Diagnosis not present

## 2014-05-14 DIAGNOSIS — Z79899 Other long term (current) drug therapy: Secondary | ICD-10-CM | POA: Diagnosis not present

## 2014-05-14 DIAGNOSIS — I252 Old myocardial infarction: Secondary | ICD-10-CM | POA: Diagnosis not present

## 2014-05-14 DIAGNOSIS — M129 Arthropathy, unspecified: Secondary | ICD-10-CM | POA: Insufficient documentation

## 2014-05-14 DIAGNOSIS — Z8659 Personal history of other mental and behavioral disorders: Secondary | ICD-10-CM | POA: Diagnosis not present

## 2014-05-14 DIAGNOSIS — Z8719 Personal history of other diseases of the digestive system: Secondary | ICD-10-CM | POA: Diagnosis not present

## 2014-05-14 DIAGNOSIS — Z951 Presence of aortocoronary bypass graft: Secondary | ICD-10-CM | POA: Diagnosis not present

## 2014-05-14 DIAGNOSIS — Z85828 Personal history of other malignant neoplasm of skin: Secondary | ICD-10-CM | POA: Insufficient documentation

## 2014-05-14 DIAGNOSIS — E785 Hyperlipidemia, unspecified: Secondary | ICD-10-CM | POA: Insufficient documentation

## 2014-05-14 DIAGNOSIS — Z87891 Personal history of nicotine dependence: Secondary | ICD-10-CM | POA: Diagnosis not present

## 2014-05-14 DIAGNOSIS — Z7982 Long term (current) use of aspirin: Secondary | ICD-10-CM | POA: Diagnosis not present

## 2014-05-14 DIAGNOSIS — J069 Acute upper respiratory infection, unspecified: Secondary | ICD-10-CM | POA: Insufficient documentation

## 2014-05-14 LAB — BASIC METABOLIC PANEL
Anion gap: 17 — ABNORMAL HIGH (ref 5–15)
BUN: 12 mg/dL (ref 6–23)
CO2: 22 meq/L (ref 19–32)
Calcium: 9 mg/dL (ref 8.4–10.5)
Chloride: 100 mEq/L (ref 96–112)
Creatinine, Ser: 0.93 mg/dL (ref 0.50–1.35)
GFR calc Af Amer: >90 mL/min (ref >90)
GFR calc non Af Amer: 88 mL/min — ABNORMAL LOW (ref >90)
Glucose, Bld: 122 mg/dL — ABNORMAL HIGH (ref 70–99)
Potassium: 4.4 mEq/L (ref 3.7–5.3)
Sodium: 139 mEq/L (ref 137–147)

## 2014-05-14 LAB — CBC
HCT: 48.9 % (ref 39.0–52.0)
Hemoglobin: 16.4 g/dL (ref 13.0–17.0)
MCH: 31.5 pg (ref 26.0–34.0)
MCHC: 33.5 g/dL (ref 30.0–36.0)
MCV: 94 fL (ref 78.0–100.0)
Platelets: 197 10*3/uL (ref 150–400)
RBC: 5.2 MIL/uL (ref 4.22–5.81)
RDW: 12.9 % (ref 11.5–15.5)
WBC: 13.3 10*3/uL — AB (ref 4.0–10.5)

## 2014-05-14 LAB — I-STAT TROPONIN, ED: Troponin i, poc: 0.01 ng/mL (ref 0.00–0.08)

## 2014-05-14 MED ORDER — DEXAMETHASONE SODIUM PHOSPHATE 10 MG/ML IJ SOLN
10.0000 mg | Freq: Once | INTRAMUSCULAR | Status: AC
  Administered 2014-05-14: 10 mg via INTRAVENOUS
  Filled 2014-05-14: qty 1

## 2014-05-14 MED ORDER — SODIUM CHLORIDE 0.9 % IV BOLUS (SEPSIS)
1000.0000 mL | Freq: Once | INTRAVENOUS | Status: AC
  Administered 2014-05-14: 1000 mL via INTRAVENOUS

## 2014-05-14 MED ORDER — ALBUTEROL SULFATE (2.5 MG/3ML) 0.083% IN NEBU
5.0000 mg | INHALATION_SOLUTION | Freq: Once | RESPIRATORY_TRACT | Status: AC
  Administered 2014-05-14: 5 mg via RESPIRATORY_TRACT
  Filled 2014-05-14: qty 6

## 2014-05-14 MED ORDER — ALBUTEROL SULFATE HFA 108 (90 BASE) MCG/ACT IN AERS: 2.0000 | INHALATION_SPRAY | RESPIRATORY_TRACT | Status: AC | PRN

## 2014-05-14 NOTE — ED Notes (Signed)
Dr. Wilson Singer aware of pt and asked if ct was needed. None at this time

## 2014-05-14 NOTE — ED Notes (Signed)
Pt states that he has had neck pain and SOB that started this morning. Pt states that he felt like his throat was "closing up" specifically his rt side. pt states that EMS came out but he began feeling better. Pt then when to his MD and was told to come here for a CT scan of neck. Pt states that his voice has been raspy since yesterday.

## 2014-05-14 NOTE — Discharge Instructions (Signed)
Upper Respiratory Infection, Adult An upper respiratory infection (URI) is also known as the common cold. It is often caused by a type of germ (virus). Colds are easily spread (contagious). You can pass it to others by kissing, coughing, sneezing, or drinking out of the same glass. Usually, you get better in 1 or 2 weeks.  HOME CARE   Only take medicine as told by your doctor.  Use a warm mist humidifier or breathe in steam from a hot shower.  Drink enough water and fluids to keep your pee (urine) clear or pale yellow.  Get plenty of rest.  Return to work when your temperature is back to normal or as told by your doctor. You may use a face mask and wash your hands to stop your cold from spreading. GET HELP RIGHT AWAY IF:   After the first few days, you feel you are getting worse.  You have questions about your medicine.  You have chills, shortness of breath, or [place] or red spit (mucus).  You have yellow or Kazmierczak snot (nasal discharge) or pain in the face, especially when you bend forward.  You have a fever, puffy (swollen) neck, pain when you swallow, or white spots in the back of your throat.  You have a bad headache, ear pain, sinus pain, or chest pain.  You have a high-pitched whistling sound when you breathe in and out (wheezing).  You have a lasting cough or cough up blood.  You have sore muscles or a stiff neck. MAKE SURE YOU:   Understand these instructions.  Will watch your condition.  Will get help right away if you are not doing well or get worse. Document Released: 01/24/2008 Document Revised: 10/30/2011 Document Reviewed: 11/12/2013 Exit[employer] Patient Information 2015 [employer]. This information is not intended to replace advice given to you by your health care provider. Make sure you discuss any questions you have with your health care provider.  

## 2014-05-14 NOTE — ED Provider Notes (Signed)
CSN: [id number]     Arrival date & time 05/14/14  1517 History   First MD Initiated Contact with Patient 05/14/14 1625     Chief Complaint  Patient presents with  . Neck Pain     (Consider location/radiation/quality/duration/timing/severity/associated sxs/prior Treatment) Patient is a 63 y.o. male presenting with shortness of breath. The history is provided by the patient.  Shortness of Breath Severity:  Mild Onset quality: pt states at 0530 this AM he had sudden onset SOB, that self resolved. Wittnessed, no rash, no swelling, no wheezing. Able to breath, but states it scared him. Went to PCP who recomm "imagnig of neck" Timing:  Constant Progression:  Resolved Chronicity:  New Context: not activity   Context comment:  Pt for past week has had chest cold sxs, cough, no chest pain, no fever, some production. Intermittent SOB Relieved by:  Nothing Worsened by:  Nothing tried Ineffective treatments:  None tried Associated symptoms: cough, sore throat and sputum production   Associated symptoms: no abdominal pain, no chest pain, no fever, no rash, no syncope, no vomiting and no wheezing   Risk factors: obesity and tobacco use   Risk factors: no family hx of DVT, no hx of PE/DVT, no prolonged immobilization and no recent surgery     Past Medical History  Diagnosis Date  . ST elevation myocardial infarction (STEMI) of inferior wall, subsequent episode of care 01/25/2000    100% mRCA  BMS 3.0 mm x 30 mm; 50% LAD, 80% D1  . CAD S/P percutaneous coronary angioplasty 01/25/2000; 01/2001    INITIAL MI -->PCI to RCA;; 6/'02 Unstable Angina -- PCI-90% pLAD (BMS x 2) 2002; 2003 -->Ostial LM 50%, 90% D1 & AVGCx, 70% ISR in RCA  . Left main coronary artery disease 11/05/01    50+ percent Left Main; 90% D1 and AV groove circumflex, 70% RCA ISR  . S/P CABG x 3 11/06/01    LIMA-LAD, RIMA-RPDA, fLRad-OM  . Hypertension   . Dyslipidemia, goal LDL below 70     On statin. Monitored by PCP  . Obesity,  Class II, BMI 35-39.9, with comorbidity   . Exertional dyspnea     Chronic  . Umbilical hernia   . GERD (gastroesophageal reflux disease)     Takes Alka-Seltzer PRN  . Basal cell carcinoma of cheek      left cheek  . Anxiety   . Arthritis    Past Surgical History  Procedure Laterality Date  . Tonsillectomy    . Joint replacement  02/2011    left knee  . Mastoid surgery      right ear as a child  . Colonoscopy    . Total knee arthroplasty  11/06/2011    Procedure: TOTAL KNEE ARTHROPLASTY;  Surgeon: Gearlean Alf, MD;  Location: [facility];  Service: Orthopedics;  Laterality: Right;  . Skin cancer excision    . Umbilical hernia repair  08/22/2012    Procedure: HERNIA REPAIR UMBILICAL ADULT;  Surgeon: Merrie Roof, MD;  Location: [facility];  Service: General;  Laterality: N/A;  umbilical hernia repair   . Percutaneous coronary stent intervention (pci-s)  01/25/2000    PCI-RCA occlusion; BMS 3.0 mm x 30 mm  . Percutaneous coronary stent intervention (pci-s)  01/31/2011    Proximal LAD- 2 overlapping Velocity BMS stents 3.0 mm x 18 mm, 3.0 mm x 8 mm   . Nm myoview ltd  11/12/2012    INFEROPICAL SCAR  ,EF 55%  . Cardiac  catheterization  11/05/2001     70% in stent  restenosis in RCA stent, 50+% distal  left main  diesae with patent LAD stent.   . Coronary artery bypass graft  11/06/01    LIMA-LAD, RIMA-RCA, [facility]-OM;   . Transthoracic echocardiogram  01/04/2011    Mild Anterior Hypokinesis, EF 50-55%, Mildly Dilaed Left Atrium, No Significant Valvular Lesions, Some Mld Aortic Sclerosis  . Nm myoview ltd  November 2009; 11/12/2012    No ischemia or infarction (anterior defect read as diaphragmatic attenuation;; March 2014: Inferior scar, EF 55% no ischemia   Family History  Problem Relation Age of Onset  . Cancer Mother     liver   History  Substance Use Topics  . Smoking status: Former Smoker -- 1.00 packs/day for 25 years    Quit date: 10/30/1998  . Smokeless tobacco: Never Used   . Alcohol Use: No    Review of Systems  Constitutional: Negative for fever, activity change and appetite change.  HENT: Positive for sore throat. Negative for congestion, rhinorrhea and sneezing.   Eyes: Negative for discharge and itching.  Respiratory: Positive for cough, sputum production and shortness of breath. Negative for wheezing.   Cardiovascular: Negative for chest pain and syncope.  Gastrointestinal: Negative for nausea, vomiting, abdominal pain, diarrhea and constipation.  Genitourinary: Negative for hematuria, decreased urine volume and difficulty urinating.  Skin: Negative for rash and wound.  Neurological: Negative for syncope, weakness and numbness.  All other systems reviewed and are negative.     Allergies  Lipitor and Zocor  Home Medications   Prior to Admission medications   Medication Sig Start Date End Date Taking? Authorizing Provider  aspirin 81 MG tablet Take 81 mg by mouth daily.   Yes Historical Provider, MD  Aspirin-Salicylamide-Caffeine (BC HEADACHE POWDER PO) Take 1 packet by mouth 2 (two) times daily as needed.   Yes Historical Provider, MD  Chlorphen-Phenyleph-ASA (ALKA-SELTZER PLUS COLD PO) Take 2 tablets by mouth 2 (two) times daily.   Yes Historical Provider, MD  Chlorpheniramine-DM (CORICIDIN HBP COUGH/COLD PO) Take 2 capsules by mouth 2 (two) times daily.   Yes Historical Provider, MD  metoprolol succinate (TOPROL-XL) 100 MG 24 hr tablet Take 100 mg by mouth at bedtime. Take with or immediately following a meal. 04/06/14  Yes Leonie Man, MD  ramipril (ALTACE) 5 MG capsule Take 5 mg by mouth 2 (two) times daily. 12/12/13  Yes Leonie Man, MD  rosuvastatin (CRESTOR) 10 MG tablet Take 10 mg by mouth at bedtime. 12/12/13  Yes Leonie Man, MD  albuterol (PROVENTIL HFA;VENTOLIN HFA) 108 (90 BASE) MCG/ACT inhaler Inhale 2 puffs into the lungs every 4 (four) hours as needed for wheezing or shortness of breath. 05/14/14   Sol Passer, MD   nitroGLYCERIN (NITROSTAT) 0.4 MG SL tablet Place 0.4 mg under the tongue every 5 (five) minutes as needed. For chest pain    Historical Provider, MD   BP 124/93  Pulse 105  Temp(Src) 98.2 F (36.8 C) (Oral)  Resp 16  SpO2 95% Physical Exam  Vitals reviewed. Constitutional: He is oriented to person, place, and time. He appears well-developed and well-nourished. No distress.  NAD, well appearing  HENT:  Head: Normocephalic and atraumatic.  Mouth/Throat: No oropharyngeal exudate.  Pharynx with erythema, uvula midline, no exudate. Mild tonsilalr swelling but symmetric, no trismus  Eyes: Conjunctivae and EOM are normal. Pupils are equal, round, and reactive to light. Right eye exhibits no discharge. Left eye exhibits no  discharge. No scleral icterus.  Neck: Normal range of motion. Neck supple.  Full ROM, mild ttp over [facility] Lymph Noses on L. Trachea midline  Cardiovascular: Normal rate, regular rhythm, normal heart sounds and intact distal pulses.  Exam reveals no gallop and no friction rub.   No murmur heard. Pulmonary/Chest: Effort normal and breath sounds normal. No respiratory distress. He has no wheezes. He has no rales.  Abdominal: Soft. He exhibits no distension and no mass. There is no tenderness.  Musculoskeletal: Normal range of motion.  Neurological: He is alert and oriented to person, place, and time. No cranial nerve deficit. He exhibits normal muscle tone. Coordination normal.  Skin: Skin is warm. No rash noted. He is not diaphoretic.    ED Course  Procedures (including critical care time) Labs Review Labs Reviewed  CBC - Abnormal; Notable for the following:    WBC 13.3 (*)    All other components within normal limits  BASIC METABOLIC PANEL - Abnormal; Notable for the following:    Glucose, Bld 122 (*)    GFR calc non Af Amer 88 (*)    Anion gap 17 (*)    All other components within normal limits  I-STAT TROPOININ, ED    Imaging Review Dg Neck Soft  Tissue  05/14/2014   CLINICAL DATA:  Neck pain  EXAM: NECK SOFT TISSUES - 1+ VIEW  COMPARISON:  None.  FINDINGS: There is no evidence of retropharyngeal soft tissue swelling or epiglottic enlargement. The cervical airway is unremarkable and no radio-opaque foreign body identified. Uvula is mildly prominent.  IMPRESSION: No other acute abnormality identified within the soft tissues of the neck.   Electronically Signed   By: Jeannine Boga M.D.   On: 05/14/2014 19:53   Dg Chest 2 View  05/14/2014   CLINICAL DATA:  Short of breath.  Cough and congestion  EXAM: CHEST  2 VIEW  COMPARISON:  08/19/2012  FINDINGS: Previous median sternotomy and CABG procedure. The heart size and mediastinal contours are within normal limits. Both lungs are clear. The visualized skeletal structures are unremarkable.  IMPRESSION: No active cardiopulmonary disease.   Electronically Signed   By: Kerby Moors M.D.   On: 05/14/2014 19:47     EKG Interpretation   Date/Time:  Thursday May 14 2014 15:35:13 EDT Ventricular Rate:  103 PR Interval:  156 QRS Duration: 92 QT Interval:  338 QTC Calculation: 442 R Axis:   -62 Text Interpretation:  Sinus tachycardia Left axis deviation Inferior  infarct , age undetermined Abnormal ECG rate faster than previous  Confirmed by YAO  MD, DAVID ([id number]) on 05/14/2014 4:31:17 PM      MDM   MDM: 63 y.o. WM w/ PMHx of CAD w/ cc: of SOB this AM. Pt has had URI sxs, congestion, cough for about 1 week. Sore throat. States that this AM he was walking and had some SOB. Now heezing. Wittnessed, no rash, no swelling. No chestp ain. Letitia Neri is PCP who wanted him to get "neck imaging." Here AFVSS, well appearing. Erythematous pharynx, uvula midline, no pain with neck twisting, no trismus, unlikely PTA/RPA. CXR clear, no PNA. Labs with mild leukocytosis, c/w URI. Albuterola nd fluids given. Decadron given. Soft tissue neck with no signs of abscess. Pt likely with Viral URI causing sxs. Not  c/w PTA/RPA as afebrile and exam above. No rash, no swelling, self resolved, unlikely allergic rxn/anaphylaxis. Suspect benign. Albuterol rx given. HR 102 at discharge, which is similar to arrival but received albuterol.  Feels better. Follow up PCP in 2 days if not better. Return to ED if worsening SOB, fever, chest pain. Discharged.   Final diagnoses:  Viral URI   New Prescriptions   ALBUTEROL (PROVENTIL HFA;VENTOLIN HFA) 108 (90 BASE) MCG/ACT INHALER    Inhale 2 puffs into the lungs every 4 (four) hours as needed for wheezing or shortness of breath.   [name], MD [place]  [address] [place] [address] [phone]  Schedule an appointment as soon as possible for a visit in 3 days    Discharged     Sol Passer, MD 05/14/14 2104

## 2014-05-16 NOTE — ED Provider Notes (Signed)
I saw and evaluated the patient, reviewed the resident's note and I agree with the findings and plan.   EKG Interpretation   Date/Time:  Thursday May 14 2014 15:35:13 EDT Ventricular Rate:  103 PR Interval:  156 QRS Duration: 92 QT Interval:  338 QTC Calculation: 442 R Axis:   -62 Text Interpretation:  Sinus tachycardia Left axis deviation Inferior  infarct , age undetermined Abnormal ECG rate faster than previous  Confirmed by Pleas Carneal  MD, Sada Mazzoni ([id number]) on 05/14/2014 4:31:17 PM      Colin Ryan is a 63 y.o. male here with shortness of breath, cough, sore throat. Symptoms for the last week, worse this morning and states had some trouble breathing. Went to PCP, sent for eval. On exam, throat not red. Some R cervical LAD. Lungs clear. Abdomen nontender. I think likely laryngitis from viral etiology. WBC 14 but CXR and neck xray clear. I doubt RPA or PTA. Given decadron and some albuterol for symptomatic relief. Will d/c home.    Wandra Arthurs, MD 05/16/14 [phone]

## 2014-09-02 ENCOUNTER — Telehealth: Payer: Self-pay | Admitting: Cardiology

## 2014-09-02 MED ORDER — ROSUVASTATIN CALCIUM 10 MG PO TABS: 10.0000 mg | ORAL_TABLET | Freq: Every day | ORAL | Status: DC

## 2014-09-02 NOTE — Telephone Encounter (Signed)
Colin Ryan called in wanting to know if she could get a Crestor discount card . She feels that the one she has has expired . Please call   Thanks

## 2014-09-02 NOTE — Telephone Encounter (Signed)
Card & samples left at front desk, pt's wife voiced understanding.

## 2014-10-02 ENCOUNTER — Telehealth: Payer: Self-pay | Admitting: Cardiology

## 2014-10-02 MED ORDER — METOPROLOL SUCCINATE ER 100 MG PO TB24: 100.0000 mg | ORAL_TABLET | Freq: Every day | ORAL | Status: DC

## 2014-10-02 NOTE — Telephone Encounter (Signed)
°  1. Which medications need to be refilled?Metoprolol 2. Which pharmacy is medication to be sent to?[facility]-[phone]  3. Do they need a 30 day or 90 day supply? 30  4. Would they like a call back once the medication has been sent to the pharmacy? yes

## 2014-10-02 NOTE — Telephone Encounter (Signed)
Rx refill sent to patient pharmacy  Patient wife informed. Also reminder that patient will need yearly follow up with Dr.Harding for additional refills.

## 2014-10-07 ENCOUNTER — Telehealth: Payer: Self-pay | Admitting: Cardiology

## 2014-10-07 NOTE — Telephone Encounter (Signed)
Pt would like some samples of Crestor 10 mg please.She just need enough for a few weeks. His insurance change on March 1st.

## 2014-10-08 MED ORDER — ROSUVASTATIN CALCIUM 10 MG PO TABS: 10.0000 mg | ORAL_TABLET | Freq: Every day | ORAL | Status: DC

## 2014-10-08 NOTE — Telephone Encounter (Signed)
Patient aware samples are at the front desk for pick up  

## 2014-11-10 ENCOUNTER — Emergency Department (HOSPITAL_COMMUNITY): Payer: BC Managed Care – PPO

## 2014-11-10 ENCOUNTER — Emergency Department (HOSPITAL_COMMUNITY)
Admission: EM | Admit: 2014-11-10 | Discharge: 2014-11-10 | Disposition: A | Discharge status: Home / Self Care | Payer: BC Managed Care – PPO | Attending: Emergency Medicine | Admitting: Emergency Medicine

## 2014-11-10 ENCOUNTER — Encounter (HOSPITAL_COMMUNITY): Payer: Self-pay | Admitting: Neurology

## 2014-11-10 DIAGNOSIS — I2510 Atherosclerotic heart disease of native coronary artery without angina pectoris: Secondary | ICD-10-CM | POA: Diagnosis not present

## 2014-11-10 DIAGNOSIS — Z7982 Long term (current) use of aspirin: Secondary | ICD-10-CM | POA: Diagnosis not present

## 2014-11-10 DIAGNOSIS — Z951 Presence of aortocoronary bypass graft: Secondary | ICD-10-CM | POA: Diagnosis not present

## 2014-11-10 DIAGNOSIS — K219 Gastro-esophageal reflux disease without esophagitis: Secondary | ICD-10-CM | POA: Insufficient documentation

## 2014-11-10 DIAGNOSIS — Z79899 Other long term (current) drug therapy: Secondary | ICD-10-CM | POA: Diagnosis not present

## 2014-11-10 DIAGNOSIS — Z792 Long term (current) use of antibiotics: Secondary | ICD-10-CM | POA: Insufficient documentation

## 2014-11-10 DIAGNOSIS — R079 Chest pain, unspecified: Secondary | ICD-10-CM | POA: Diagnosis present

## 2014-11-10 DIAGNOSIS — Z9861 Coronary angioplasty status: Secondary | ICD-10-CM | POA: Insufficient documentation

## 2014-11-10 DIAGNOSIS — Z85828 Personal history of other malignant neoplasm of skin: Secondary | ICD-10-CM | POA: Insufficient documentation

## 2014-11-10 DIAGNOSIS — E785 Hyperlipidemia, unspecified: Secondary | ICD-10-CM | POA: Diagnosis not present

## 2014-11-10 DIAGNOSIS — Z9889 Other specified postprocedural states: Secondary | ICD-10-CM | POA: Diagnosis not present

## 2014-11-10 DIAGNOSIS — M1990 Unspecified osteoarthritis, unspecified site: Secondary | ICD-10-CM | POA: Insufficient documentation

## 2014-11-10 DIAGNOSIS — Z8659 Personal history of other mental and behavioral disorders: Secondary | ICD-10-CM | POA: Diagnosis not present

## 2014-11-10 DIAGNOSIS — J159 Unspecified bacterial pneumonia: Secondary | ICD-10-CM | POA: Diagnosis not present

## 2014-11-10 DIAGNOSIS — I10 Essential (primary) hypertension: Secondary | ICD-10-CM | POA: Diagnosis not present

## 2014-11-10 DIAGNOSIS — Z87891 Personal history of nicotine dependence: Secondary | ICD-10-CM | POA: Diagnosis not present

## 2014-11-10 DIAGNOSIS — J189 Pneumonia, unspecified organism: Secondary | ICD-10-CM

## 2014-11-10 DIAGNOSIS — I252 Old myocardial infarction: Secondary | ICD-10-CM | POA: Diagnosis not present

## 2014-11-10 LAB — CBC
HCT: 47.1 % (ref 39.0–52.0)
Hemoglobin: 15.7 g/dL (ref 13.0–17.0)
MCH: 31 pg (ref 26.0–34.0)
MCHC: 33.3 g/dL (ref 30.0–36.0)
MCV: 93.1 fL (ref 78.0–100.0)
Platelets: 243 10*3/uL (ref 150–400)
RBC: 5.06 MIL/uL (ref 4.22–5.81)
RDW: 13.3 % (ref 11.5–15.5)
WBC: 14.1 10*3/uL — AB (ref 4.0–10.5)

## 2014-11-10 LAB — BASIC METABOLIC PANEL
ANION GAP: 13 (ref 5–15)
BUN: 14 mg/dL (ref 6–23)
CO2: 23 mmol/L (ref 19–32)
Calcium: 9.3 mg/dL (ref 8.4–10.5)
Chloride: 101 mmol/L (ref 96–112)
Creatinine, Ser: 1.06 mg/dL (ref 0.50–1.35)
GFR calc Af Amer: 84 mL/min — ABNORMAL LOW (ref >90)
GFR calc non Af Amer: 72 mL/min — ABNORMAL LOW (ref >90)
Glucose, Bld: 109 mg/dL — ABNORMAL HIGH (ref 70–99)
POTASSIUM: 4.2 mmol/L (ref 3.5–5.1)
SODIUM: 137 mmol/L (ref 135–145)

## 2014-11-10 LAB — I-STAT TROPONIN, ED: TROPONIN I, POC: 0 ng/mL (ref 0.00–0.08)

## 2014-11-10 MED ORDER — KETOROLAC TROMETHAMINE 30 MG/ML IJ SOLN
30.0000 mg | Freq: Once | INTRAMUSCULAR | Status: AC
  Administered 2014-11-10: 30 mg via INTRAVENOUS
  Filled 2014-11-10: qty 1

## 2014-11-10 MED ORDER — LEVOFLOXACIN 500 MG PO TABS
500.0000 mg | ORAL_TABLET | Freq: Once | ORAL | Status: AC
Start: 2014-11-10 — End: 2014-11-10
  Administered 2014-11-10: 500 mg via ORAL
  Filled 2014-11-10: qty 1

## 2014-11-10 MED ORDER — OXYCODONE-ACETAMINOPHEN 5-325 MG PO TABS: 1.0000 | ORAL_TABLET | ORAL | Status: DC | PRN

## 2014-11-10 MED ORDER — MORPHINE SULFATE 4 MG/ML IJ SOLN
4.0000 mg | Freq: Once | INTRAMUSCULAR | Status: AC
  Administered 2014-11-10: 4 mg via INTRAVENOUS
  Filled 2014-11-10: qty 1

## 2014-11-10 MED ORDER — LEVOFLOXACIN 500 MG PO TABS: 500.0000 mg | ORAL_TABLET | Freq: Every day | ORAL | Status: DC

## 2014-11-10 NOTE — ED Notes (Signed)
Pt reports onset left sided cp 30 mins ago while resting, feels sob. Feels clammy to touch. Has cardiac hx.

## 2014-11-10 NOTE — Discharge Instructions (Signed)

## 2014-11-10 NOTE — ED Provider Notes (Signed)
CSN: [id number]     Arrival date & time 11/10/14  1124 History   First MD Initiated Contact with Patient 11/10/14 1135     Chief Complaint  Patient presents with  . Chest Pain      HPI Patient reports sharp left-sided chest pain today while resting.  He's had productive cough over the past several days of generalized fatigue this week.  He feels worsening pain in his right lateral chest when he coughs.  He does have some discomfort with deep breathing.  No history of pulmonary embolism.  He does have a history of MI was concerned given his left-sided chest pain that he had this morning.  He stated he wanted to be cautious.  No exertional chest pain or shortness of breath.  Symptoms are mild in severity.  Past Medical History  Diagnosis Date  . ST elevation myocardial infarction (STEMI) of inferior wall, subsequent episode of care 01/25/2000    100% mRCA  BMS 3.0 mm x 30 mm; 50% LAD, 80% D1  . CAD S/P percutaneous coronary angioplasty 01/25/2000; 01/2001    INITIAL MI -->PCI to RCA;; 6/'02 Unstable Angina -- PCI-90% pLAD (BMS x 2) 2002; 2003 -->Ostial LM 50%, 90% D1 & AVGCx, 70% ISR in RCA  . Left main coronary artery disease 11/05/01    50+ percent Left Main; 90% D1 and AV groove circumflex, 70% RCA ISR  . S/P CABG x 3 11/06/01    LIMA-LAD, RIMA-RPDA, fLRad-OM  . Hypertension   . Dyslipidemia, goal LDL below 70     On statin. Monitored by PCP  . Obesity, Class II, BMI 35-39.9, with comorbidity   . Exertional dyspnea     Chronic  . Umbilical hernia   . GERD (gastroesophageal reflux disease)     Takes Alka-Seltzer PRN  . Basal cell carcinoma of cheek      left cheek  . Anxiety   . Arthritis    Past Surgical History  Procedure Laterality Date  . Tonsillectomy    . Joint replacement  02/2011    left knee  . Mastoid surgery      right ear as a child  . Colonoscopy    . Total knee arthroplasty  11/06/2011    Procedure: TOTAL KNEE ARTHROPLASTY;  Surgeon: Gearlean Alf, MD;  Location:  [facility];  Service: Orthopedics;  Laterality: Right;  . Skin cancer excision    . Umbilical hernia repair  08/22/2012    Procedure: HERNIA REPAIR UMBILICAL ADULT;  Surgeon: Merrie Roof, MD;  Location: [place];  Service: General;  Laterality: N/A;  umbilical hernia repair   . Percutaneous coronary stent intervention (pci-s)  01/25/2000    PCI-RCA occlusion; BMS 3.0 mm x 30 mm  . Percutaneous coronary stent intervention (pci-s)  01/31/2011    Proximal LAD- 2 overlapping Velocity BMS stents 3.0 mm x 18 mm, 3.0 mm x 8 mm   . Nm myoview ltd  11/12/2012    INFEROPICAL SCAR  ,EF 55%  . Cardiac catheterization  11/05/2001     70% in stent  restenosis in RCA stent, 50+% distal  left main  diesae with patent LAD stent.   . Coronary artery bypass graft  11/06/01    LIMA-LAD, RIMA-RCA, [facility]-OM;   . Transthoracic echocardiogram  01/04/2011    Mild Anterior Hypokinesis, EF 50-55%, Mildly Dilaed Left Atrium, No Significant Valvular Lesions, Some Mld Aortic Sclerosis  . Nm myoview ltd  November 2009; 11/12/2012    No  ischemia or infarction (anterior defect read as diaphragmatic attenuation;; March 2014: Inferior scar, EF 55% no ischemia   Family History  Problem Relation Age of Onset  . Cancer Mother     liver   History  Substance Use Topics  . Smoking status: Former Smoker -- 1.00 packs/day for 25 years    Quit date: 10/30/1998  . Smokeless tobacco: Never Used  . Alcohol Use: No    Review of Systems  All other systems reviewed and are negative.     Allergies  Lipitor and Zocor  Home Medications   Prior to Admission medications   Medication Sig Start Date End Date Taking? Authorizing Provider  amoxicillin (AMOXIL) 500 MG capsule Take 500 mg by mouth 3 (three) times daily. 10 day regimen   Yes Historical Provider, MD  aspirin 81 MG tablet Take 81 mg by mouth daily.   Yes Historical Provider, MD  guaiFENesin (MUCINEX) 600 MG 12 hr tablet Take 1,200 mg by mouth 2 (two) times daily.   Yes  Historical Provider, MD  HYDROcodone-acetaminophen (NORCO/VICODIN) 5-325 MG per tablet Take 1 tablet by mouth every 6 (six) hours as needed for moderate pain.   Yes Historical Provider, MD  metoprolol succinate (TOPROL-XL) 100 MG 24 hr tablet Take 1 tablet (100 mg total) by mouth at bedtime. Take with or immediately following a meal. 10/02/14  Yes Leonie Man, MD  nitroGLYCERIN (NITROSTAT) 0.4 MG SL tablet Place 0.4 mg under the tongue every 5 (five) minutes as needed. For chest pain   Yes Historical Provider, MD  ramipril (ALTACE) 5 MG capsule Take 5 mg by mouth 2 (two) times daily. 12/12/13  Yes Leonie Man, MD  rosuvastatin (CRESTOR) 10 MG tablet Take 1 tablet (10 mg total) by mouth at bedtime. 10/08/14  Yes Leonie Man, MD  albuterol (PROVENTIL HFA;VENTOLIN HFA) 108 (90 BASE) MCG/ACT inhaler Inhale 2 puffs into the lungs every 4 (four) hours as needed for wheezing or shortness of breath. Patient not taking: Reported on 11/10/2014 05/14/14   Sol Passer, MD   BP 102/62 mmHg  Pulse 84  Temp(Src) 98.3 F (36.8 C) (Oral)  Resp 17  Wt 270 lb (122.471 kg)  SpO2 97% Physical Exam  Constitutional: He is oriented to person, place, and time. He appears well-developed and well-nourished.  HENT:  Head: Normocephalic and atraumatic.  Eyes: EOM are normal.  Neck: Normal range of motion.  Cardiovascular: Normal rate, regular rhythm, normal heart sounds and intact distal pulses.   Pulmonary/Chest: Effort normal. No respiratory distress.  Rhonchi right base  Abdominal: Soft. He exhibits no distension. There is no tenderness.  Musculoskeletal: Normal range of motion.  Neurological: He is alert and oriented to person, place, and time.  Skin: Skin is warm and dry.  Psychiatric: He has a normal mood and affect. Judgment normal.  Nursing note and vitals reviewed.   ED Course  Procedures (including critical care time) Labs Review Labs Reviewed  CBC - Abnormal; Notable for the following:     WBC 14.1 (*)    All other components within normal limits  BASIC METABOLIC PANEL - Abnormal; Notable for the following:    Glucose, Bld 109 (*)    GFR calc non Af Amer 72 (*)    GFR calc Af Amer 84 (*)    All other components within normal limits  Randolm Idol, ED    Imaging Review Dg Chest 2 View  11/10/2014   CLINICAL DATA:  Chest pain ant lt  side since this am  EXAM: CHEST - 2 VIEW  COMPARISON:  05/14/2014  FINDINGS: Airspace consolidation in the lingula, new since previous exam. Right lung clear. Heart size normal. Previous median sternotomy and CABG. No effusion.  No pneumothorax. Visualized skeletal structures are unremarkable.  IMPRESSION: 1. Lingular pneumonia.   Electronically Signed   By: Lucrezia Europe M.D.   On: 11/10/2014 13:04  I personally reviewed the imaging tests through PACS system I reviewed available ER/hospitalization records through the EMR    EKG Interpretation   Date/Time:  Tuesday November 10 2014 11:31:48 EDT Ventricular Rate:  114 PR Interval:  154 QRS Duration: 96 QT Interval:  330 QTC Calculation: 193 R Axis:   -61 Text Interpretation:  Sinus tachycardia Left anterior fascicular block  Inferior infarct , age undetermined Abnormal ECG No significant change was  found Confirmed by Fayrene Towner  MD, Hollis Tuller ([id number]) on 11/10/2014 11:36:58 AM      MDM   Final diagnoses:  None    Clinically this sounds like pneumonia with pleurisy.  Doubt ACS.  Doubt PE.  Doubt dissection.  Patient is overall well-appearing.  Vital signs are normal at time of discharge.  Home with Levaquin and pain medication.  Patient understands return to the ER for new or worsening symptoms.    Jola Schmidt, MD 11/12/14 [phone]

## 2014-12-07 ENCOUNTER — Encounter: Payer: Self-pay | Admitting: Cardiology

## 2014-12-07 ENCOUNTER — Ambulatory Visit (INDEPENDENT_AMBULATORY_CARE_PROVIDER_SITE_OTHER): Payer: BC Managed Care – PPO | Admitting: Cardiology

## 2014-12-07 VITALS — BP 148/80 | Ht 73.0 in | Wt 290.2 lb

## 2014-12-07 DIAGNOSIS — I2119 ST elevation (STEMI) myocardial infarction involving other coronary artery of inferior wall: Secondary | ICD-10-CM

## 2014-12-07 DIAGNOSIS — E785 Hyperlipidemia, unspecified: Secondary | ICD-10-CM

## 2014-12-07 DIAGNOSIS — I2510 Atherosclerotic heart disease of native coronary artery without angina pectoris: Secondary | ICD-10-CM | POA: Diagnosis not present

## 2014-12-07 DIAGNOSIS — I10 Essential (primary) hypertension: Secondary | ICD-10-CM

## 2014-12-07 DIAGNOSIS — IMO0001 Reserved for inherently not codable concepts without codable children: Secondary | ICD-10-CM

## 2014-12-07 DIAGNOSIS — Z951 Presence of aortocoronary bypass graft: Secondary | ICD-10-CM

## 2014-12-07 MED ORDER — RAMIPRIL 5 MG PO CAPS: 5.0000 mg | ORAL_CAPSULE | Freq: Two times a day (BID) | ORAL | Status: DC

## 2014-12-07 MED ORDER — NITROGLYCERIN 0.4 MG SL SUBL: 0.4000 mg | SUBLINGUAL_TABLET | SUBLINGUAL | Status: DC | PRN

## 2014-12-07 MED ORDER — METOPROLOL SUCCINATE ER 100 MG PO TB24: 100.0000 mg | ORAL_TABLET | Freq: Every day | ORAL | Status: DC

## 2014-12-07 MED ORDER — ROSUVASTATIN CALCIUM 10 MG PO TABS: 10.0000 mg | ORAL_TABLET | Freq: Every day | ORAL | Status: DC

## 2014-12-07 NOTE — Progress Notes (Signed)
PCP: [name], MD  Clinic Note: Chief Complaint  Patient presents with  . Annual Exam    NO CHEST PAIN ABOUT 2-3 WEEKS DX WITH PNEUMONIA, NO SHORTNESS OF BREATH,NO SWELLING  . Coronary Artery Disease    HPI: Colin Ryan is a 64 y.o. male with a PMH below who presents today for one-year followup of CAD. He is a very pleasant gentleman with a history of CABG in 2003 for left main and RCA disease that was 2 years after his initial MI with PCI to the RCA. He then had PCI to the proximal LAD in 2002. He is moderately obese with hypertension, dyslipidemia and osteoarthritis. I last saw him in March of 2014 he was doing relatively well. He keeps doing a roller coaster ride up weights. He is currently 8 pounds up from last saw him. In 2012 he is waiting 260 pounds and currently weighing 289. He says he tried to work on his diet and exercise but keeps getting off the band wagon. He is otherwise been relatively stable since his CABG. He did have a LexiScan Myoview done last March 2014 showing relatively preserved EF with inferoapical hypokinesis and an inferoapical scar that is consistent with known anatomy and prior RCA infarct..  Past Medical History  Diagnosis Date  . ST elevation myocardial infarction (STEMI) of inferior wall, subsequent episode of care 01/25/2000    100% mRCA  BMS 3.0 mm x 30 mm; 50% LAD, 80% D1  . CAD S/P percutaneous coronary angioplasty 01/25/2000; 01/2001    INITIAL MI -->PCI to RCA;; 6/'02 Unstable Angina -- PCI-90% pLAD (BMS x 2) 2002; 2003 -->Ostial LM 50%, 90% D1 & AVGCx, 70% ISR in RCA  . Left main coronary artery disease 11/05/01    50+ percent Left Main; 90% D1 and AV groove circumflex, 70% RCA ISR  . S/P CABG x 3 11/06/01    LIMA-LAD, RIMA-RPDA, fLRad-OM  . Hypertension   . Dyslipidemia, goal LDL below 70     On statin. Monitored by PCP  . Obesity, Class II, BMI 35-39.9, with comorbidity   . Exertional dyspnea     Chronic  . Umbilical hernia   . GERD  (gastroesophageal reflux disease)     Takes Alka-Seltzer PRN  . Basal cell carcinoma of cheek      left cheek  . Anxiety   . Arthritis     Prior Cardiac Evaluation and Past Surgical History: Past Surgical History  Procedure Laterality Date  . Tonsillectomy    . Joint replacement  02/2011    left knee  . Mastoid surgery      right ear as a child  . Colonoscopy    . Total knee arthroplasty  11/06/2011    Procedure: TOTAL KNEE ARTHROPLASTY;  Surgeon: Gearlean Alf, MD;  Location: [facility];  Service: Orthopedics;  Laterality: Right;  . Skin cancer excision    . Umbilical hernia repair  08/22/2012    Procedure: HERNIA REPAIR UMBILICAL ADULT;  Surgeon: Merrie Roof, MD;  Location: [facility];  Service: General;  Laterality: N/A;  umbilical hernia repair   . Percutaneous coronary stent intervention (pci-s)  01/25/2000    PCI-RCA occlusion; BMS 3.0 mm x 30 mm  . Percutaneous coronary stent intervention (pci-s)  01/31/2011    Proximal LAD- 2 overlapping Velocity BMS stents 3.0 mm x 18 mm, 3.0 mm x 8 mm   . Nm myoview ltd  11/12/2012    INFEROPICAL SCAR  ,EF  55%  . Cardiac catheterization  11/05/2001     70% in stent  restenosis in RCA stent, 50+% distal  left main  diesae with patent LAD stent.   . Coronary artery bypass graft  11/06/01    LIMA-LAD, RIMA-RCA, [facility]-OM;   . Transthoracic echocardiogram  01/04/2011    Mild Anterior Hypokinesis, EF 50-55%, Mildly Dilaed Left Atrium, No Significant Valvular Lesions, Some Mld Aortic Sclerosis  . Nm myoview ltd  November 2009; 11/12/2012    No ischemia or infarction (anterior defect read as diaphragmatic attenuation;; March 2014: Inferior scar, EF 55% no ischemia    Interval History: ~2-3 weeks ago, noted chest discomfort with intermittent coughing.  Pain got worse, so he went to ER -- CXR showed lingular PNA.  CP was worse with inspiration.  Finally recovering from PNA.  Still notes continues Nasal congestion.  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  B/C PNA - has not been as active & has gained weight.  Cardiovascular ROS: positive for - chest pain and CP associated with PNA only; was painful to breath in.fff negative for - dyspnea on exertion, edema, irregular heartbeat, orthopnea, palpitations, paroxysmal nocturnal dyspnea, rapid heart rate, shortness of breath or syncope/near syncope, TIA/amaurosis fugax symptoms Pretty much getting over PNA.  No claudication - occasional cold feet.  Mild discoloration of legs.  Occasional night-time leg cramps.  ROS: A comprehensive was performed. Review of Systems  Constitutional: Negative for weight loss and malaise/fatigue.       No more F/C from PNA  Respiratory: Negative for cough and wheezing.   Cardiovascular: Positive for leg swelling (mild).  Gastrointestinal: Negative for blood in stool and melena.  Genitourinary: Negative for hematuria.  Musculoskeletal: Negative.   Skin:       Had thrush from Abx  Endo/Heme/Allergies: Does not bruise/bleed easily.  Psychiatric/Behavioral: Negative.   All other systems reviewed and are negative.   Current Outpatient Prescriptions on File Prior to Visit  Medication Sig Dispense Refill  . albuterol (PROVENTIL HFA;VENTOLIN HFA) 108 (90 BASE) MCG/ACT inhaler Inhale 2 puffs into the lungs every 4 (four) hours as needed for wheezing or shortness of breath. 3.7 g 0  . aspirin 81 MG tablet Take 81 mg by mouth daily.     No current facility-administered medications on file prior to visit.   Allergies  Allergen Reactions  . Lipitor [Atorvastatin] Other (See Comments)    UNSPECIFIED.   [name] Zocor [Simvastatin] Other (See Comments)    UNSPECIFIED.     History  Substance Use Topics  . Smoking status: Former Smoker -- 1.00 packs/day for 25 years    Quit date: 10/30/1998  . Smokeless tobacco: Never Used  . Alcohol Use: No   Family History  Problem Relation Age of Onset  . Cancer Mother      liver    Wt Readings from Last 3 Encounters:  12/07/14 290 lb 3.2 oz (131.634 kg)  11/10/14 270 lb (122.471 kg)  12/12/13 289 lb 11.2 oz (131.407 kg)    PHYSICAL EXAM BP 148/80 mmHg  Ht 6\' 1"  (1.854 m)  Wt 290 lb 3.2 oz (131.634 kg)  BMI 38.30 kg/m2 General appearance: alert, cooperative, appears stated age, no distress, moderately obese  HEENT: [place]/AT, EOMI, MMM, anicteric sclera Neck: no adenopathy, no carotid bruit, no JVD, supple, symmetrical, trachea midline and thyroid not enlarged, symmetric, no tenderness/mass/nodules Lungs: clear to auscultation bilaterally, normal percussion bilaterally and  Nonlabored, good air movement. Heart: regular rate and rhythm, S1, S2 normal, no murmur, click, rub or gallop and normal apical impulse Abdomen: soft, non-tender; bowel sounds normal; no masses, no organomegaly Extremities: extremities normal, atraumatic, no cyanosis or edema Pulses: 2+ and symmetric Neurologic: Alert and oriented X 3, normal strength and tone. Normal symmetric reflexes. Normal coordination and gait Psych:  Normal mood & Affect   Adult ECG Report  Rate: 76 ;  Rhythm: normal sinus rhythm and PVC, LAFB (-53), CRO Inferior MI - age undetermined  Narrative Interpretation: Stable EKG  Recent Labs:  By PCP - not sure (is due for physical exam in May)  ASSESSMENT / PLAN: Problem List Items Addressed This Visit    CORONARY ARTERY BYPASS GRAFT, HX OF (Chronic)    Last Myoview was in March of 2014. Without symptoms, I would not followup again until roughly 2018.      Relevant Medications   rosuvastatin (CRESTOR) 10 MG tablet   ramipril (ALTACE) 5 MG capsule   nitroGLYCERIN (NITROSTAT) 0.4 MG SL tablet   metoprolol succinate (TOPROL-XL) 100 MG 24 hr tablet   Coronary artery disease involving left main coronary artery - status post CABG x3 - Primary (Chronic)    No active cardiac symptoms of angina. He did fine with his recent pneumonia. His chest pain symptoms are  mostly related to pleuritic pain as well as chest wall pain from coughing. He does not have symptoms consistent with his previous anginal equivalent. No heart failure symptoms.  Continue beta blocker and ACE inhibitor along with aspirin statin. Has not required when necessary nitroglycerin.      Relevant Medications   rosuvastatin (CRESTOR) 10 MG tablet   ramipril (ALTACE) 5 MG capsule   nitroGLYCERIN (NITROSTAT) 0.4 MG SL tablet   metoprolol succinate (TOPROL-XL) 100 MG 24 hr tablet   Other Relevant Orders   EKG 12-Lead (Completed)   Dyslipidemia, goal LDL below 70 (Chronic)    Monitored by PCP.  Due for physical exam in May. I look forward to reviewing these results. Would recommend checking a NMR panel to insure accuracy of labs. He was relatively close to goal on current dose of Crestor last visit.      Relevant Medications   rosuvastatin (CRESTOR) 10 MG tablet   ramipril (ALTACE) 5 MG capsule   nitroGLYCERIN (NITROSTAT) 0.4 MG SL tablet   metoprolol succinate (TOPROL-XL) 100 MG 24 hr tablet   Other Relevant Orders   EKG 12-Lead (Completed)   Essential hypertension (Chronic)    Slightly elevated today.  Still relatively close to target, although we will need to monitor. He is on Toprol 10 mg weren't drawn today and stable dose of metoprolol. Additional blood pressure and consider calcium channel blocker versus diuretic.      Relevant Medications   rosuvastatin (CRESTOR) 10 MG tablet   ramipril (ALTACE) 5 MG capsule   nitroGLYCERIN (NITROSTAT) 0.4 MG SL tablet   metoprolol succinate (TOPROL-XL) 100 MG 24 hr tablet   Other Relevant Orders   EKG 12-Lead (Completed)   H/O ST elevation myocardial infarction (STEMI) of inferior wall -- RCA; inferior infarct on Myoview. (Chronic)    My suspicion is that he got high attenuation noted on Myoview stress test is probably normal in infarct. No evidence of ischemia. No evidence of heart failure with preserved EF.      Relevant  Medications   rosuvastatin (CRESTOR) 10 MG tablet   ramipril (ALTACE) 5 MG capsule   nitroGLYCERIN (NITROSTAT) 0.4  MG SL tablet   metoprolol succinate (TOPROL-XL) 100 MG 24 hr tablet   Obesity, Class II, BMI 35-39.9, with comorbidity (Chronic)    We again discussed dietary modification and exercise. Partly because of his recent illness he has gained back some weight he had lost. We discussed dietary modification and increasing exercise.      Relevant Orders   EKG 12-Lead (Completed)      Followup: f/u 1 year   Arayla Kruschke, Leonie Green, M.D., M.S. Interventional Cardiologist   Pager # [phone]

## 2014-12-07 NOTE — Patient Instructions (Signed)
MO CHANGE IN MEDICATIONS  Your physician wants you to follow-up in [place].  You will receive a reminder letter in the mail two months in advance. If you don't receive a letter, please call our office to schedule the follow-up appointment.

## 2014-12-09 ENCOUNTER — Encounter: Payer: Self-pay | Admitting: Cardiology

## 2014-12-09 NOTE — Assessment & Plan Note (Signed)
No active cardiac symptoms of angina. He did fine with his recent pneumonia. His chest pain symptoms are mostly related to pleuritic pain as well as chest wall pain from coughing. He does not have symptoms consistent with his previous anginal equivalent. No heart failure symptoms.  Continue beta blocker and ACE inhibitor along with aspirin statin. Has not required when necessary nitroglycerin.

## 2014-12-09 NOTE — Assessment & Plan Note (Signed)
My suspicion is that he got high attenuation noted on Myoview stress test is probably normal in infarct. No evidence of ischemia. No evidence of heart failure with preserved EF.

## 2014-12-09 NOTE — Assessment & Plan Note (Signed)
Last Myoview was in March of 2014. Without symptoms, I would not followup again until roughly 2018.

## 2014-12-09 NOTE — Assessment & Plan Note (Signed)
Slightly elevated today.  Still relatively close to target, although we will need to monitor. He is on Toprol 10 mg weren't drawn today and stable dose of metoprolol. Additional blood pressure and consider calcium channel blocker versus diuretic.

## 2014-12-09 NOTE — Assessment & Plan Note (Signed)
We again discussed dietary modification and exercise. Partly because of his recent illness he has gained back some weight he had lost. We discussed dietary modification and increasing exercise.

## 2014-12-09 NOTE — Assessment & Plan Note (Signed)
Monitored by PCP.  Due for physical exam in May. I look forward to reviewing these results. Would recommend checking a NMR panel to insure accuracy of labs. He was relatively close to goal on current dose of Crestor last visit.

## 2014-12-24 ENCOUNTER — Other Ambulatory Visit: Payer: Self-pay | Admitting: Cardiology

## 2014-12-24 MED ORDER — ROSUVASTATIN CALCIUM 10 MG PO TABS: 10.0000 mg | ORAL_TABLET | Freq: Every day | ORAL | Status: DC

## 2014-12-24 NOTE — Telephone Encounter (Signed)
°  1. Which medications need to be refilled? Crestor  2. Which pharmacy is medication to be sent to?[facility]  3. Do they need a 30 day or 90 day supply? 30  4. Would they like a call back once the medication has been sent to the pharmacy? yes

## 2014-12-24 NOTE — Telephone Encounter (Signed)
Rx(s) sent to pharmacy electronically. Patient's wife notified.  

## 2015-02-15 ENCOUNTER — Telehealth: Payer: Self-pay | Admitting: Cardiology

## 2015-02-15 NOTE — Telephone Encounter (Signed)
Left message  CMP AND LIPID are needed from th cardiologist standpoint Any question may call back.

## 2015-02-15 NOTE — Telephone Encounter (Signed)
Colin Ryan is calling to find out what labs Dr. Ellyn Hack is wanting so that Colin Ryan can tell his primary and have them drawn. Please call    Thanks

## 2015-02-19 ENCOUNTER — Encounter: Payer: Self-pay | Admitting: Cardiology

## 2015-03-04 NOTE — Progress Notes (Signed)
Quick Note:  Pls forward to PCP: [name], MD   Recent Labs: 02/17/2015 Na+ 138, K+ 4.8, Cl- 103, HCO3- 26 , BUN 19, Cr 1.05, Glu 128, Ca2+ 9.7; AST 19, ALT 29, AlkP 109, Alb 4.2, TP 7.0, T Bili 0.6  TC 146, TG 113, HDL 39, LDL 85  LDL is not quite @ goal --> lets see if he can tolerate increasing Crestor to 20mg   HARDING, Leonie Green, MD ______

## 2015-03-09 ENCOUNTER — Telehealth: Payer: Self-pay | Admitting: *Deleted

## 2015-03-09 MED ORDER — ROSUVASTATIN CALCIUM 20 MG PO TABS: 20.0000 mg | ORAL_TABLET | Freq: Every day | ORAL | Status: DC

## 2015-03-09 NOTE — Telephone Encounter (Signed)
Returning call,about blood work.

## 2015-03-09 NOTE — Telephone Encounter (Signed)
Left message to call back -regards to lab

## 2015-03-09 NOTE — Telephone Encounter (Signed)
Information given to wife New prescription  E-sent to pharmacy Wife aware - to double 10 mg tablets until bottle is empty. Then start 20 mg tablets.

## 2015-03-09 NOTE — Telephone Encounter (Signed)
-----   Message from Leonie Man, MD sent at 03/04/2015  9:36 AM EDT ----- Pls forward to PCP: [name], MD   Recent Labs: 02/17/2015 Na+ 138, K+ 4.8, Cl- 103, HCO3- 26 , BUN 19, Cr 1.05, Glu 128, Ca2+ 9.7; AST 19, ALT 29, AlkP 109, Alb 4.2, TP 7.0, T Bili 0.6  TC 146, TG 113, HDL 39, LDL 85  LDL is not quite @ goal --> lets see if he can tolerate increasing Crestor to 20mg   HARDING, Leonie Green, MD

## 2015-12-16 ENCOUNTER — Telehealth: Payer: Self-pay | Admitting: Cardiology

## 2015-12-16 MED ORDER — METOPROLOL SUCCINATE ER 100 MG PO TB24: 100.0000 mg | ORAL_TABLET | Freq: Every day | ORAL | Status: DC

## 2015-12-16 MED ORDER — RAMIPRIL 5 MG PO CAPS: 5.0000 mg | ORAL_CAPSULE | Freq: Two times a day (BID) | ORAL | Status: DC

## 2015-12-16 NOTE — Telephone Encounter (Signed)
E-SENT TO PHARMACY PATIENT AWARE 

## 2015-12-16 NOTE — Telephone Encounter (Signed)
New message      *STAT* If patient is at the pharmacy, call can be transferred to refill team.   1. Which medications need to be refilled? (please list name of each medication and dose if known) metoprolol 100mg  and ramipril 2. Which pharmacy/location (including street and city if local pharmacy) is medication to be sent to? [facility]  3. Do they need a 30 day or 90 day supply? 90 day supply

## 2015-12-31 ENCOUNTER — Other Ambulatory Visit: Payer: Self-pay

## 2015-12-31 MED ORDER — ROSUVASTATIN CALCIUM 20 MG PO TABS: 20.0000 mg | ORAL_TABLET | Freq: Every day | ORAL | Status: DC

## 2016-02-08 ENCOUNTER — Encounter: Payer: Self-pay | Admitting: Cardiology

## 2016-02-08 ENCOUNTER — Ambulatory Visit (INDEPENDENT_AMBULATORY_CARE_PROVIDER_SITE_OTHER): Payer: BC Managed Care – PPO | Admitting: Cardiology

## 2016-02-08 VITALS — BP 104/75 | HR 81 | Ht 68.0 in | Wt 281.8 lb

## 2016-02-08 DIAGNOSIS — I10 Essential (primary) hypertension: Secondary | ICD-10-CM

## 2016-02-08 DIAGNOSIS — IMO0001 Reserved for inherently not codable concepts without codable children: Secondary | ICD-10-CM

## 2016-02-08 DIAGNOSIS — I2119 ST elevation (STEMI) myocardial infarction involving other coronary artery of inferior wall: Secondary | ICD-10-CM | POA: Diagnosis not present

## 2016-02-08 DIAGNOSIS — E785 Hyperlipidemia, unspecified: Secondary | ICD-10-CM

## 2016-02-08 DIAGNOSIS — I2510 Atherosclerotic heart disease of native coronary artery without angina pectoris: Secondary | ICD-10-CM | POA: Diagnosis not present

## 2016-02-08 DIAGNOSIS — Z951 Presence of aortocoronary bypass graft: Secondary | ICD-10-CM

## 2016-02-08 MED ORDER — NITROGLYCERIN 0.4 MG SL SUBL: 0.4000 mg | SUBLINGUAL_TABLET | SUBLINGUAL | Status: DC | PRN

## 2016-02-08 MED ORDER — ROSUVASTATIN CALCIUM 20 MG PO TABS: 20.0000 mg | ORAL_TABLET | Freq: Every day | ORAL | Status: DC

## 2016-02-08 NOTE — Patient Instructions (Addendum)
No changes with current medications  Refilled NTG, CRESTOR   Your physician wants you to follow-up in:12 MONTHS WITH DR HARDING.  You will receive a reminder letter in the mail two months in advance. If you don't receive a letter, please call our office to schedule the follow-up appointment.   If you need a refill on your cardiac medications before your next appointment, please call your pharmacy.

## 2016-02-08 NOTE — Progress Notes (Addendum)
PCP: [name], MD  Clinic Note: Chief Complaint  Patient presents with  . Annual Exam    Pt states no Sx.  . Coronary Artery Disease    MI-PCI-CABG    HPI: Colin Ryan is a 65 y.o. male with a PMH below who presents today for 14 month follow-up for history of CAD.[name]  He is a very pleasant gentleman with a history of CABG in 2003 for left main and RCA disease that was 2 years after his initial MI with PCI to the RCA. He then had PCI to the proximal LAD in 2002. He is moderately obese with hypertension, dyslipidemia and osteoarthritis.  AVIYON RINELLA was last seen on 12/07/2014  Recent Hospitalizations: none   Studies Reviewed: none  Interval History: Colin Ryan returns for his annual follow-up doing quite well without any major complaints. He continues to be active @ gym, but has taken a slightly break because he has needed to help look after his son who had unexpected surgery. Overall, he is doing quite well. He denies any resting or exertional dyspnea or chest tightness/pressure. No heart failure symptoms of PND, orthopnea or edema.  No palpitations, lightheadedness, dizziness, weakness or syncope/near syncope. No TIA/amaurosis fugax symptoms. No melena, hematochezia, hematuria, or epstaxis. No claudication.  ROS: A comprehensive was performed. Review of Systems  Constitutional: Negative for fever, chills and malaise/fatigue.       PNA ~ 8 months ago; sharp pains in chest   HENT: Positive for congestion.   Respiratory: Negative for shortness of breath.   Cardiovascular: Negative for palpitations and leg swelling.  Gastrointestinal: Positive for heartburn. Negative for blood in stool and melena.  Genitourinary: Negative for hematuria.  Musculoskeletal: Positive for joint pain (hand arthritis - thumbs).  Neurological: Negative for dizziness, loss of consciousness and headaches.  Endo/Heme/Allergies: Does not bruise/bleed easily.  Psychiatric/Behavioral: Negative for memory  loss. The patient is not nervous/anxious and does not have insomnia.   All other systems reviewed and are negative.    Past Medical History  Diagnosis Date  . ST elevation myocardial infarction (STEMI) of inferior wall, subsequent episode of care ([place]) 01/25/2000    100% mRCA  BMS 3.0 mm x 30 mm; 50% LAD, 80% D1  . CAD S/P percutaneous coronary angioplasty 01/25/2000; 01/2001    INITIAL MI -->PCI to RCA;; 6/'02 Unstable Angina -- PCI-90% pLAD (BMS x 2) 2002; 2003 -->Ostial LM 50%, 90% D1 & AVGCx, 70% ISR in RCA  . Left main coronary artery disease 11/05/01    50+ percent Left Main; 90% D1 and AV groove circumflex, 70% RCA ISR  . S/P CABG x 3 11/06/01    LIMA-LAD, RIMA-RPDA, fLRad-OM  . Hypertension   . Dyslipidemia, goal LDL below 70     On statin. Monitored by PCP  . Obesity, Class II, BMI 35-39.9, with comorbidity ([place])   . Exertional dyspnea     Chronic  . Umbilical hernia   . GERD (gastroesophageal reflux disease)     Takes Alka-Seltzer PRN  . Basal cell carcinoma of cheek      left cheek  . Anxiety   . Arthritis     Past Surgical History  Procedure Laterality Date  . Tonsillectomy    . Joint replacement  02/2011    left knee  . Mastoid surgery      right ear as a child  . Colonoscopy    . Total knee arthroplasty  11/06/2011    Procedure: TOTAL KNEE ARTHROPLASTY;  Surgeon: Gearlean Alf, MD;  Location: [facility];  Service: Orthopedics;  Laterality: Right;  . Skin cancer excision    . Umbilical hernia repair  08/22/2012    Procedure: HERNIA REPAIR UMBILICAL ADULT;  Surgeon: Merrie Roof, MD;  Location: [facility];  Service: General;  Laterality: N/A;  umbilical hernia repair   . Percutaneous coronary stent intervention (pci-s)  01/25/2000    PCI-RCA occlusion; BMS 3.0 mm x 30 mm  . Percutaneous coronary stent intervention (pci-s)  01/30/2001    Proximal LAD- 2 overlapping Velocity BMS stents 3.0 mm x 18 mm, 3.0 mm x 8 mm   . Nm myoview ltd  11/12/2012    INFEROPICAL SCAR  ,EF 55%    . Cardiac catheterization  11/05/2001     70% in stent  restenosis in RCA stent, 50+% distal  left main  diesae with patent LAD stent.   . Coronary artery bypass graft  11/06/01    LIMA-LAD, RIMA-RCA, [facility]-OM;   . Transthoracic echocardiogram  01/04/2011    Mild Anterior Hypokinesis, EF 50-55%, Mildly Dilaed Left Atrium, No Significant Valvular Lesions, Some Mld Aortic Sclerosis  . Nm myoview ltd  November 2009; 11/12/2012    No ischemia or infarction (anterior defect read as diaphragmatic attenuation;; March 2014: Inferior scar, EF 55% no ischemia    Prior to Admission medications   Medication Sig Start Date Taking? Authorizing Provider  albuterol (PROVENTIL HFA;VENTOLIN HFA) 108 (90 BASE) MCG/ACT inhaler Inhale 2 puffs into the lungs every 4 (four) hours as needed for wheezing or shortness of breath. 05/14/14 Yes Sol Passer, MD  aspirin 81 MG tablet Take 81 mg by mouth daily.  Yes Historical Provider, MD  metoprolol succinate (TOPROL-XL) 100 MG 24 hr tablet Take 1 tablet (100 mg total) by mouth at bedtime. Take with or immediately following a meal. 12/16/15 Yes Leonie Man, MD  nitroGLYCERIN (NITROSTAT) 0.4 MG SL tablet Place 1 tablet (0.4 mg total) under the tongue every 5 (five) minutes as needed. For chest pain 12/07/14 Yes Leonie Man, MD  ramipril (ALTACE) 5 MG capsule Take 1 capsule (5 mg total) by mouth 2 (two) times daily. 12/16/15 Yes Leonie Man, MD  rosuvastatin (CRESTOR) 20 MG tablet Take 1 tablet (20 mg total) by mouth daily. 12/31/15 Yes Leonie Man, MD     Allergies  Allergen Reactions  . Lipitor [Atorvastatin] Other (See Comments)    UNSPECIFIED.   [name] Zocor [Simvastatin] Other (See Comments)    UNSPECIFIED.     Social History   Social History  . Marital Status: Married    Spouse Name: N/A  . Number of Children: N/A  . Years of Education: N/A   Social History Main Topics  . Smoking status: Former Smoker -- 1.00 packs/day for 25 years    Quit  date: 10/30/1998  . Smokeless tobacco: Never Used  . Alcohol Use: No  . Drug Use: No  . Sexual Activity: Not Asked   Other Topics Concern  . None   Social History Narrative   He is a married father of 2.    Still trying to get back into exercise -- just has a hard time keeping up.   He does not drink, does not smoke; he quit smoking in 2003 shortly after his CABG.     Family History  Problem Relation Age of Onset  . Cancer Mother     liver    Wt Readings from Last 3 Encounters:  02/08/16 281 lb 12.8 oz (127.824 kg)  12/07/14 290 lb 3.2 oz (131.634 kg)  11/10/14 270 lb (122.471 kg)  -- was back @ 270 until last month.   PHYSICAL EXAM BP 104/75 mmHg  Pulse 81  Ht 5\' 8"  (1.727 m)  Wt 281 lb 12.8 oz (127.824 kg)  BMI 42.86 kg/m2 General appearance: alert, cooperative, appears stated age, no distress, moderately obese  HEENT: [place]/AT, EOMI, MMM, anicteric sclera Neck: no adenopathy, no carotid bruit, no JVD, supple, symmetrical, trachea midline and thyroid not enlarged, symmetric, no tenderness/mass/nodules Lungs: clear to auscultation bilaterally, normal percussion bilaterally and Nonlabored, good air movement. Heart: regular rate and rhythm, S1, S2 normal, no murmur, click, rub or gallop and normal apical impulse Abdomen: soft, non-tender; bowel sounds normal; no masses, no organomegaly Extremities: extremities normal, atraumatic, no cyanosis or edema Pulses: 2+ and symmetric Neurologic: Alert and oriented X 3, normal strength and tone. Normal symmetric reflexes. Normal coordination and gait Psych: Normal mood & Affect    Adult ECG Report Normal sinus rhythm with occasional PVCs. Rate 63 BPM. Left axis deviation (-53) / LAFB. Nonspecific ST abnormalities noted in leads 3 and aVF.  Inferior Q waves: Cannot exclude inferior MI, age undetermined. Otherwise normal axis intervals and durations.  Other studies Reviewed: Additional studies/ records that were reviewed today  include:  Recent Labs:  PCP No results found for: CHOL, HDL, LDLCALC, LDLDIRECT, TRIG, CHOLHDL    ASSESSMENT / PLAN: Problem List Items Addressed This Visit    Obesity, Class II, BMI 35-39.9, with comorbidity ([facility]) (Chronic)    He needs to get back into exercise. He done well and lost some weight, but gained some back over the past few months while caring for his son. He needs to go back to monitoring his diet and increasing exercise.      H/O ST elevation myocardial infarction (STEMI) of inferior wall -- RCA; inferior infarct on Myoview. (Chronic)    No evidence ischemia on his Myoview. However, there was evidence of inferior scar noted in March 2014 Myoview. I suspect the Myoview in 2009 was misinterpreted as good attenuation not an infarct. Preserved ejection fraction with no active anginal symptoms.      Relevant Medications   rosuvastatin (CRESTOR) 20 MG tablet   nitroGLYCERIN (NITROSTAT) 0.4 MG SL tablet   Other Relevant Orders   EKG 12-Lead (Completed)   Essential hypertension (Chronic)    Well-controlled on current meds. No change      Relevant Medications   rosuvastatin (CRESTOR) 20 MG tablet   nitroGLYCERIN (NITROSTAT) 0.4 MG SL tablet   Other Relevant Orders   EKG 12-Lead (Completed)   Dyslipidemia, goal LDL below 70 (Chronic)    Unfortunately, his labs were monitored by his PCP and I don't have any access to those labs. He is on Crestor and tolerating it well without any significant myalgias or other issues.      Relevant Medications   rosuvastatin (CRESTOR) 20 MG tablet   nitroGLYCERIN (NITROSTAT) 0.4 MG SL tablet   Other Relevant Orders   EKG 12-Lead (Completed)   Coronary artery disease involving left main coronary artery - status post CABG x3 - Primary (Chronic)    Status post CABG with LIMA-LAD RIMA-RCA and radial to the OM. Chest pain free. On beta blocker and ACE inhibitor along with aspirin and statin. Not having to use when necessary nitroglycerin.        Relevant Medications   rosuvastatin (CRESTOR) 20 MG tablet   nitroGLYCERIN (  NITROSTAT) 0.4 MG SL tablet   Other Relevant Orders   EKG 12-Lead (Completed)   CORONARY ARTERY BYPASS GRAFT, HX OF (Chronic)    With a Myoview in March 2014, we would be due for reevaluation and possible Myoview next year. He would like to try to hold alcohol as we can is not having symptoms. He is concerned about cost, and would prefer not to evaluate a unless he has symptoms for now.      Relevant Medications   rosuvastatin (CRESTOR) 20 MG tablet   nitroGLYCERIN (NITROSTAT) 0.4 MG SL tablet      Current medicines are reviewed at length with the patient today. (+/- concerns) None The following changes have been made: None  Refilled prescription for nitroglycerin and Crestor.  Studies Ordered:   Orders Placed This Encounter  Procedures  . EKG 12-Lead   ROS: 12 months    Glenetta Hew, M.D., M.S. Interventional Cardiologist   Pager # [phone] Phone # [phone] [address] [place], [place] [address]

## 2016-02-10 ENCOUNTER — Encounter: Payer: Self-pay | Admitting: Cardiology

## 2016-02-10 NOTE — Assessment & Plan Note (Signed)
Status post CABG with LIMA-LAD RIMA-RCA and radial to the OM. Chest pain free. On beta blocker and ACE inhibitor along with aspirin and statin. Not having to use when necessary nitroglycerin.

## 2016-02-10 NOTE — Assessment & Plan Note (Signed)
He needs to get back into exercise. He done well and lost some weight, but gained some back over the past few months while caring for his son. He needs to go back to monitoring his diet and increasing exercise.

## 2016-02-10 NOTE — Assessment & Plan Note (Signed)
No evidence ischemia on his Myoview. However, there was evidence of inferior scar noted in March 2014 Myoview. I suspect the Myoview in 2009 was misinterpreted as good attenuation not an infarct. Preserved ejection fraction with no active anginal symptoms.

## 2016-02-10 NOTE — Assessment & Plan Note (Signed)
Unfortunately, his labs were monitored by his PCP and I don't have any access to those labs. He is on Crestor and tolerating it well without any significant myalgias or other issues.

## 2016-02-10 NOTE — Assessment & Plan Note (Signed)
With a Myoview in March 2014, we would be due for reevaluation and possible Myoview next year. He would like to try to hold alcohol as we can is not having symptoms. He is concerned about cost, and would prefer not to evaluate a unless he has symptoms for now.

## 2016-02-10 NOTE — Assessment & Plan Note (Signed)
Well-controlled on current meds.  No change 

## 2016-02-10 NOTE — Addendum Note (Signed)
Addended by: Leonie Man on: 02/10/2016 12:24 AM   Modules accepted: Miquel Dunn

## 2016-05-17 ENCOUNTER — Telehealth: Payer: Self-pay | Admitting: Cardiology

## 2016-05-17 NOTE — Telephone Encounter (Signed)
Spoke patient's wife.  Patient saw Dr Paulita Fujita to schedule a routine colonoscopy and possible endoscopy due to recent   chest burning /hurting and shortness of breath if bending.  Per wife, Dr Paulita Fujita would like patient to follow up with cardiology to have a stress test to rule out heart issues prior to procedures.  Patient wanted to know if test can be schedule or is appointment is needed.  Procedures are schedule for next week but can be reschedule.   Wife and patient aware will defer to Dr Ellyn Hack and call them back

## 2016-05-17 NOTE — Telephone Encounter (Signed)
She said his GI have requested that he gets a stress test,please call.

## 2016-05-17 NOTE — Telephone Encounter (Signed)
Left message to cal back to schedule appointment.

## 2016-05-17 NOTE — Telephone Encounter (Signed)
Would probably need to be seen to order tests - need a diagnosis.  Glenetta Hew, MD

## 2016-05-18 NOTE — Telephone Encounter (Signed)
Spoke to wife. Appointment schedule for patient on 05/26/16 with Dr Ellyn Hack

## 2016-05-26 ENCOUNTER — Encounter: Payer: Self-pay | Admitting: Cardiology

## 2016-05-26 ENCOUNTER — Ambulatory Visit (INDEPENDENT_AMBULATORY_CARE_PROVIDER_SITE_OTHER): Payer: PPO | Admitting: Cardiology

## 2016-05-26 VITALS — BP 144/66 | HR 80 | Ht 73.0 in | Wt 306.3 lb

## 2016-05-26 DIAGNOSIS — E669 Obesity, unspecified: Secondary | ICD-10-CM

## 2016-05-26 DIAGNOSIS — Z01818 Encounter for other preprocedural examination: Secondary | ICD-10-CM

## 2016-05-26 DIAGNOSIS — I2510 Atherosclerotic heart disease of native coronary artery without angina pectoris: Secondary | ICD-10-CM | POA: Diagnosis not present

## 2016-05-26 DIAGNOSIS — I2119 ST elevation (STEMI) myocardial infarction involving other coronary artery of inferior wall: Secondary | ICD-10-CM | POA: Diagnosis not present

## 2016-05-26 DIAGNOSIS — IMO0001 Reserved for inherently not codable concepts without codable children: Secondary | ICD-10-CM

## 2016-05-26 DIAGNOSIS — Z951 Presence of aortocoronary bypass graft: Secondary | ICD-10-CM | POA: Diagnosis not present

## 2016-05-26 DIAGNOSIS — R079 Chest pain, unspecified: Secondary | ICD-10-CM

## 2016-05-26 DIAGNOSIS — I10 Essential (primary) hypertension: Secondary | ICD-10-CM

## 2016-05-26 NOTE — Patient Instructions (Addendum)
Your physician has requested that you have a lexiscan myoview. For further information please visit [email or web address]. Please follow instruction sheet, as given.    NO OTHER CHANGES   LET us KNOW ABOUT THE MEDICATION FOR WEIGHT LOSS   KEEP SCHEDULE APPOINTMENT  June 2018 WITH DR Arona.  If you need a refill on your cardiac medications before your next appointment, please call your pharmacy.

## 2016-05-26 NOTE — Progress Notes (Signed)
PCP: [name], MD  Clinic Note: Chief Complaint  Patient presents with  . Chest Pain    burning & aching in chest  . Coronary Artery Disease    HPI: Colin Ryan is a 65 y.o. male with a PMH below who presents today for 64month follow-up for history of CAD.[name]  He is a very pleasant gentleman with a history of CABG in 2003 for left main and RCA disease that was 2 years after his initial MI with PCI to the RCA. He then had PCI to the proximal LAD in 2002. He is moderately obese with hypertension, dyslipidemia and osteoarthritis.  Colin Ryan was last seen on June 2017 - He was doing very well, was going to the gym to try to lose weight. No major changes were made.  Recent Hospitalizations: none   Studies Reviewed: none  Interval History: Colin Ryan presents today for evaluation of chest discomfort. He's been seen by his GI doctor who intends to do endoscopy, but insists on having a cardiology evaluation for this chest discomfort. The discomfort is described as a burning sensation in the center of his chest. It's not pressure. He doesn't have any nausea associated with it. He really doesn't think that it is associated with exertion, but has noticed it while doing exertion. He has noticed it while doing things like mowing his lawn or washing his car. He does note that when it was occurring with some type of activity in [place] with reduced activity. He has had some improvement with Prilosec. He has had no associated other symptoms of diaphoresis or nausea or dyspnea associated with the symptoms that were his classic angina symptoms. That also was a pressure in his chest radiating to his left arm. He's not had any of that symptom.   Otherwise review cardiac symptoms as follows:  No heart failure symptoms of PND, orthopnea or edema.  No palpitations, lightheadedness, dizziness, weakness or syncope/near syncope. No TIA/amaurosis fugax symptoms. No melena, hematochezia, hematuria, or  epstaxis. No claudication.  ROS: A comprehensive was performed. Review of Systems  Constitutional: Negative for chills, fever and malaise/fatigue.       PNA ~ 8 months ago; sharp pains in chest   HENT: Positive for congestion.   Respiratory: Negative for shortness of breath.   Cardiovascular: Negative for palpitations and leg swelling.  Gastrointestinal: Positive for heartburn. Negative for blood in stool and melena.  Genitourinary: Negative for hematuria.  Musculoskeletal: Positive for joint pain (hand arthritis - thumbs).  Neurological: Negative for dizziness, loss of consciousness and headaches.  Endo/Heme/Allergies: Does not bruise/bleed easily.  Psychiatric/Behavioral: Negative for memory loss. The patient is not nervous/anxious and does not have insomnia.   All other systems reviewed and are negative.   Past Medical History:  Diagnosis Date  . Anxiety   . Arthritis   . Basal cell carcinoma of cheek     left cheek  . CAD S/P percutaneous coronary angioplasty 01/25/2000; 01/2001   INITIAL MI -->PCI to RCA;; 6/'02 Unstable Angina -- PCI-90% pLAD (BMS x 2) 2002; 2003 -->Ostial LM 50%, 90% D1 & AVGCx, 70% ISR in RCA  . Dyslipidemia, goal LDL below 70    On statin. Monitored by PCP  . Exertional dyspnea    Chronic  . GERD (gastroesophageal reflux disease)    Takes Alka-Seltzer PRN  . Hypertension   . Left main coronary artery disease 11/05/01   50+ percent Left Main; 90% D1 and AV groove circumflex, 70% RCA ISR  .  Obesity, Class II, BMI 35-39.9, with comorbidity   . S/P CABG x 3 11/06/01   LIMA-LAD, RIMA-RPDA, fLRad-OM  . ST elevation myocardial infarction (STEMI) of inferior wall, subsequent episode of care ([place]) 01/25/2000   100% mRCA  BMS 3.0 mm x 30 mm; 50% LAD, 80% D1  . Umbilical hernia     Past Surgical History:  Procedure Laterality Date  . CARDIAC CATHETERIZATION  11/05/2001    70% in stent  restenosis in RCA stent, 50+% distal  left main  diesae with patent LAD  stent.   . COLONOSCOPY    . CORONARY ARTERY BYPASS GRAFT  11/06/01   LIMA-LAD, RIMA-RCA, [facility]-OM;   . JOINT REPLACEMENT  02/2011   left knee  . mastoid surgery     right ear as a child  . NM MYOVIEW LTD  11/12/2012   INFEROPICAL SCAR  ,EF 55%  . NM MYOVIEW LTD  November 2009; 11/12/2012   No ischemia or infarction (anterior defect read as diaphragmatic attenuation;; March 2014: Inferior scar, EF 55% no ischemia  . PERCUTANEOUS CORONARY STENT INTERVENTION (PCI-S)  01/25/2000   PCI-RCA occlusion; BMS 3.0 mm x 30 mm  . PERCUTANEOUS CORONARY STENT INTERVENTION (PCI-S)  01/30/2001   Proximal LAD- 2 overlapping Velocity BMS stents 3.0 mm x 18 mm, 3.0 mm x 8 mm   . SKIN CANCER EXCISION    . TONSILLECTOMY    . TOTAL KNEE ARTHROPLASTY  11/06/2011   Procedure: TOTAL KNEE ARTHROPLASTY;  Surgeon: Gearlean Alf, MD;  Location: [facility];  Service: Orthopedics;  Laterality: Right;  . TRANSTHORACIC ECHOCARDIOGRAM  01/04/2011   Mild Anterior Hypokinesis, EF 50-55%, Mildly Dilaed Left Atrium, No Significant Valvular Lesions, Some Mld Aortic Sclerosis  . UMBILICAL HERNIA REPAIR  08/22/2012   Procedure: HERNIA REPAIR UMBILICAL ADULT;  Surgeon: Merrie Roof, MD;  Location: [facility];  Service: General;  Laterality: N/A;  umbilical hernia repair     Prior to Admission medications   Medication Sig Start Date Taking? Authorizing Provider  albuterol (PROVENTIL HFA;VENTOLIN HFA) 108 (90 BASE) MCG/ACT inhaler Inhale 2 puffs into the lungs every 4 (four) hours as needed for wheezing or shortness of breath. 05/14/14 Yes Sol Passer, MD  aspirin 81 MG tablet Take 81 mg by mouth daily.  Yes Historical Provider, MD  metoprolol succinate (TOPROL-XL) 100 MG 24 hr tablet Take 1 tablet (100 mg total) by mouth at bedtime. Take with or immediately following a meal. 12/16/15 Yes Leonie Man, MD  nitroGLYCERIN (NITROSTAT) 0.4 MG SL tablet Place 1 tablet (0.4 mg total) under the tongue every 5 (five) minutes as needed. For  chest pain 12/07/14 Yes Leonie Man, MD  ramipril (ALTACE) 5 MG capsule Take 1 capsule (5 mg total) by mouth 2 (two) times daily. 12/16/15 Yes Leonie Man, MD  rosuvastatin (CRESTOR) 20 MG tablet Take 1 tablet (20 mg total) by mouth daily. 12/31/15 Yes Leonie Man, MD     Allergies  Allergen Reactions  . Lipitor [Atorvastatin] Other (See Comments)    UNSPECIFIED.   [name] Zocor [Simvastatin] Other (See Comments)    UNSPECIFIED.     Social History   Social History  . Marital status: Married    Spouse name: N/A  . Number of children: N/A  . Years of education: N/A   Social History Main Topics  . Smoking status: Former Smoker    Packs/day: 1.00    Years: 25.00    Quit date: 10/30/1998  . Smokeless  tobacco: Never Used  . Alcohol use No  . Drug use: No  . Sexual activity: Not Asked   Other Topics Concern  . None   Social History Narrative   He is a married father of 2.    Still trying to get back into exercise -- just has a hard time keeping up.   He does not drink, does not smoke; he quit smoking in 2003 shortly after his CABG.     Family History  Problem Relation Age of Onset  . Cancer Mother     liver  . Alzheimer's disease Father   . Suicidality Brother     Wt Readings from Last 3 Encounters:  05/26/16 (!) 138.9 kg (306 lb 4.8 oz)  02/08/16 127.8 kg (281 lb 12.8 oz)  12/07/14 131.6 kg (290 lb 3.2 oz)  -- Stopped exercising because of chest discomfort symptoms  PHYSICAL EXAM BP (!) 144/66   Pulse 80   Ht 6\' 1"  (1.854 m)   Wt (!) 138.9 kg (306 lb 4.8 oz)   BMI 40.41 kg/m  General appearance: alert, cooperative, appears stated age, no distress, moderately obese  HEENT: [place]/AT, EOMI, MMM, anicteric sclera Neck: no adenopathy, no carotid bruit, no JVD, supple, symmetrical, trachea midline and thyroid not enlarged, symmetric, no tenderness/mass/nodules Lungs: clear to auscultation bilaterally, normal percussion bilaterally and Nonlabored, good air  movement. Heart: regular rate and rhythm, S1, S2 normal, no murmur, click, rub or gallop and normal apical impulse Abdomen: soft, non-tender; bowel sounds normal; no masses, no organomegaly Extremities: extremities normal, atraumatic, no cyanosis or edema Pulses: 2+ and symmetric Neurologic: Alert and oriented X 3, normal strength and tone. Normal symmetric reflexes. Normal coordination and gait Psych: Normal mood & Affect    Adult ECG Report NSR 80 - Sinus rhythm with sinus arrhythmia. Left axis deviation (-60) nonspecific ST and T-wave changes. Suspect abnormal lead placement with V3 not having R-wave. - Otherwise stable EKG.  Other studies Reviewed: Additional studies/ records that were reviewed today include:  Recent Labs:  PCP No results found for: CHOL, HDL, LDLCALC, LDLDIRECT, TRIG, CHOLHDL    ASSESSMENT / PLAN: Problem List Items Addressed This Visit    Pre-op testing    In order to exclude ischemia prior to GI evaluations, we will with Myoview stress test at this time as opposed to waiting until next year.      Relevant Orders   EKG 12-Lead   Myocardial Perfusion Imaging   Obesity, Class II, BMI 35-39.9, with comorbidity (Chronic)    Much for any other reason, I'll likely the stress test now in order to make him feel comfortable getting back intoRegimen. He asked about a diet pill, but is not sure the name. When we call him back with a stress test results, he we'll remind [place] of what the medicine is and I will have our pharmacy team research the medicine as it is any central down side effects with his ongoing medical issues and medications.  I essentially recommend dietary discretion and increasing exercise.      Essential hypertension (Chronic)    Not is well-controlled today as in June. He's gained a lot of weight and less active. This may be contributing. We'll see how his blood pressure response is when he returns for his stress test.      Relevant Orders   EKG  12-Lead   Myocardial Perfusion Imaging   Coronary artery disease involving left main coronary artery - status post CABG x3 (Chronic)  Was doing well as of June, saw him. Is on stable dose of beta blocker, ACE inhibitor as well as aspirin and statin. Had not had use nitroglycerin. He really doesn't know symmetrical motion helps his current symptoms very much either.      Relevant Orders   EKG 12-Lead   Myocardial Perfusion Imaging   CORONARY ARTERY BYPASS GRAFT, HX OF (Chronic)   Relevant Orders   EKG 12-Lead   Myocardial Perfusion Imaging   Chest pain with moderate risk for cardiac etiology - Primary    He does have some typical but mostly atypical symptoms for angina. My suspicion is this is probably GERD related, but cannot exclude ischemia. He would be due for a stress test to follow-up his existing disease next year. Now with somewhat concerning symptoms, we would then proceed with a Lexiscan Myoview to exclude ischemia.      Relevant Orders   EKG 12-Lead   Myocardial Perfusion Imaging    Other Visit Diagnoses   None.     Current medicines are reviewed at length with the patient today. (+/- concerns) None The following changes have been made: None  Refilled prescription for nitroglycerin and Crestor.  Studies Ordered:   Orders Placed This Encounter  Procedures  . Myocardial Perfusion Imaging  . EKG 12-Lead   ROS: As originally scheduled in June 2018 stress test is normal. Otherwise we'll see shortly after.    Glenetta Hew, M.D., M.S. Interventional Cardiologist   Pager # [phone] Phone # [phone] [address] [place], [place] [address]

## 2016-05-28 ENCOUNTER — Encounter: Payer: Self-pay | Admitting: Cardiology

## 2016-05-28 NOTE — Assessment & Plan Note (Signed)
Was doing well as of June, saw him. Is on stable dose of beta blocker, ACE inhibitor as well as aspirin and statin. Had not had use nitroglycerin. He really doesn't know symmetrical motion helps his current symptoms very much either.

## 2016-05-28 NOTE — Assessment & Plan Note (Signed)
He does have some typical but mostly atypical symptoms for angina. My suspicion is this is probably GERD related, but cannot exclude ischemia. He would be due for a stress test to follow-up his existing disease next year. Now with somewhat concerning symptoms, we would then proceed with a Lexiscan Myoview to exclude ischemia.

## 2016-05-28 NOTE — Assessment & Plan Note (Signed)
In order to exclude ischemia prior to GI evaluations, we will with Myoview stress test at this time as opposed to waiting until next year.

## 2016-05-28 NOTE — Assessment & Plan Note (Signed)
Not is well-controlled today as in June. He's gained a lot of weight and less active. This may be contributing. We'll see how his blood pressure response is when he returns for his stress test.

## 2016-05-28 NOTE — Assessment & Plan Note (Signed)
Much for any other reason, I'll likely the stress test now in order to make him feel comfortable getting back intoRegimen. He asked about a diet pill, but is not sure the name. When we call him back with a stress test results, he we'll remind [place] of what the medicine is and I will have our pharmacy team research the medicine as it is any central down side effects with his ongoing medical issues and medications.  I essentially recommend dietary discretion and increasing exercise.

## 2016-06-06 ENCOUNTER — Telehealth (HOSPITAL_COMMUNITY): Payer: Self-pay

## 2016-06-06 NOTE — Telephone Encounter (Signed)
Encounter complete. 

## 2016-06-08 ENCOUNTER — Ambulatory Visit (HOSPITAL_COMMUNITY)
Admission: RE | Admit: 2016-06-08 | Discharge: 2016-06-08 | Disposition: A | Discharge status: Home / Self Care | Payer: PPO | Source: Ambulatory Visit | Attending: Cardiovascular Disease | Admitting: Cardiovascular Disease

## 2016-06-08 DIAGNOSIS — I10 Essential (primary) hypertension: Secondary | ICD-10-CM | POA: Insufficient documentation

## 2016-06-08 DIAGNOSIS — Z951 Presence of aortocoronary bypass graft: Secondary | ICD-10-CM | POA: Diagnosis not present

## 2016-06-08 DIAGNOSIS — Z01818 Encounter for other preprocedural examination: Secondary | ICD-10-CM

## 2016-06-08 DIAGNOSIS — R079 Chest pain, unspecified: Secondary | ICD-10-CM | POA: Diagnosis not present

## 2016-06-08 DIAGNOSIS — R9439 Abnormal result of other cardiovascular function study: Secondary | ICD-10-CM | POA: Diagnosis not present

## 2016-06-08 DIAGNOSIS — I2510 Atherosclerotic heart disease of native coronary artery without angina pectoris: Secondary | ICD-10-CM | POA: Insufficient documentation

## 2016-06-08 MED ORDER — TECHNETIUM TC 99M TETROFOSMIN IV KIT
32.0000 | PACK | Freq: Once | INTRAVENOUS | Status: AC | PRN
  Administered 2016-06-08: 32 via INTRAVENOUS
  Filled 2016-06-08: qty 32

## 2016-06-08 MED ORDER — REGADENOSON 0.4 MG/5ML IV SOLN
0.4000 mg | Freq: Once | INTRAVENOUS | Status: AC
  Administered 2016-06-08: 0.4 mg via INTRAVENOUS

## 2016-06-09 ENCOUNTER — Ambulatory Visit (HOSPITAL_COMMUNITY)
Admission: RE | Admit: 2016-06-09 | Discharge: 2016-06-09 | Disposition: A | Discharge status: Home / Self Care | Payer: PPO | Source: Ambulatory Visit | Attending: Cardiology | Admitting: Cardiology

## 2016-06-09 LAB — MYOCARDIAL PERFUSION IMAGING
CHL CUP NUCLEAR SRS: 8
CHL CUP NUCLEAR SSS: 8
CSEPPHR: 85 {beats}/min
LV sys vol: 68 mL
LVDIAVOL: 146 mL (ref 62–150)
Rest HR: 74 {beats}/min
SDS: 0
TID: 0.92

## 2016-06-09 MED ORDER — TECHNETIUM TC 99M TETROFOSMIN IV KIT
30.0000 | PACK | Freq: Once | INTRAVENOUS | Status: AC | PRN
  Administered 2016-06-09: 30 via INTRAVENOUS

## 2016-07-04 DIAGNOSIS — Z23 Encounter for immunization: Secondary | ICD-10-CM | POA: Diagnosis not present

## 2016-07-12 DIAGNOSIS — Z23 Encounter for immunization: Secondary | ICD-10-CM | POA: Diagnosis not present

## 2016-07-18 ENCOUNTER — Telehealth: Payer: Self-pay | Admitting: Cardiology

## 2016-07-18 NOTE — Telephone Encounter (Signed)
Pt wife is asking to speak with Judeen Hammans

## 2016-07-19 NOTE — Telephone Encounter (Signed)
LEFT MESSAGE TO CALL BACK

## 2016-07-19 NOTE — Telephone Encounter (Signed)
Which diet pills are safe for [place] to take? They are Alli,Garcinia and Hydroxy Cut.

## 2016-07-20 ENCOUNTER — Telehealth: Payer: Self-pay | Admitting: Cardiology

## 2016-07-20 NOTE — Telephone Encounter (Signed)
Spoke to patient's wife . Information given earlier to patient. Wife aware. Discuss information. Verbalized understanding

## 2016-07-20 NOTE — Telephone Encounter (Signed)
Returning your call. °

## 2016-07-20 NOTE — Telephone Encounter (Signed)
left message on home phone .  spoke to patient cell phone  Per  Dr Ellyn Hack and [facility] pharmacist. Prefer not use any of the  3 listed diet supplements for weight loss except Alli - patient can use this medication but several hours from regular dose prescription medications. Per Dr Ellyn Hack, exercise and portion control.  Patient states he is aware that  diet medication is a temporary situation. He states he wants to have a jumpstart.  Patient verbalized understanding

## 2016-07-31 DIAGNOSIS — R1013 Epigastric pain: Secondary | ICD-10-CM | POA: Diagnosis not present

## 2016-07-31 DIAGNOSIS — Z86010 Personal history of colonic polyps: Secondary | ICD-10-CM | POA: Diagnosis not present

## 2016-07-31 DIAGNOSIS — K2970 Gastritis, unspecified, without bleeding: Secondary | ICD-10-CM | POA: Diagnosis not present

## 2016-07-31 DIAGNOSIS — K5730 Diverticulosis of large intestine without perforation or abscess without bleeding: Secondary | ICD-10-CM | POA: Diagnosis not present

## 2016-07-31 DIAGNOSIS — K219 Gastro-esophageal reflux disease without esophagitis: Secondary | ICD-10-CM | POA: Diagnosis not present

## 2016-07-31 DIAGNOSIS — K644 Residual hemorrhoidal skin tags: Secondary | ICD-10-CM | POA: Diagnosis not present

## 2016-08-22 DIAGNOSIS — F410 Panic disorder [episodic paroxysmal anxiety] without agoraphobia: Secondary | ICD-10-CM | POA: Diagnosis not present

## 2016-08-22 DIAGNOSIS — J029 Acute pharyngitis, unspecified: Secondary | ICD-10-CM | POA: Diagnosis not present

## 2016-08-22 DIAGNOSIS — R05 Cough: Secondary | ICD-10-CM | POA: Diagnosis not present

## 2016-09-08 ENCOUNTER — Other Ambulatory Visit: Payer: Self-pay

## 2016-09-11 DIAGNOSIS — R197 Diarrhea, unspecified: Secondary | ICD-10-CM | POA: Diagnosis not present

## 2016-09-29 DIAGNOSIS — R1310 Dysphagia, unspecified: Secondary | ICD-10-CM | POA: Diagnosis not present

## 2016-09-29 DIAGNOSIS — J069 Acute upper respiratory infection, unspecified: Secondary | ICD-10-CM | POA: Diagnosis not present

## 2016-10-09 DIAGNOSIS — H608X3 Other otitis externa, bilateral: Secondary | ICD-10-CM | POA: Diagnosis not present

## 2016-10-09 DIAGNOSIS — J3089 Other allergic rhinitis: Secondary | ICD-10-CM | POA: Diagnosis not present

## 2016-10-09 DIAGNOSIS — H95191 Other disorders following mastoidectomy, right ear: Secondary | ICD-10-CM | POA: Diagnosis not present

## 2016-12-04 ENCOUNTER — Telehealth: Payer: Self-pay | Admitting: Cardiology

## 2016-12-04 MED ORDER — ROSUVASTATIN CALCIUM 20 MG PO TABS: 20.0000 mg | ORAL_TABLET | Freq: Every day | ORAL | 3 refills | Status: DC

## 2016-12-04 MED ORDER — METOPROLOL SUCCINATE ER 100 MG PO TB24: 100.0000 mg | ORAL_TABLET | Freq: Every day | ORAL | 3 refills | Status: DC

## 2016-12-04 MED ORDER — RAMIPRIL 5 MG PO CAPS: 5.0000 mg | ORAL_CAPSULE | Freq: Two times a day (BID) | ORAL | 3 refills | Status: DC

## 2016-12-04 NOTE — Telephone Encounter (Signed)
Spoke with pt wife, follow up appointment scheduled and refills sent to the pharmacy.

## 2016-12-04 NOTE — Telephone Encounter (Signed)
°   New message    pt wife verbalized that she is calling to speak to rn

## 2017-01-02 ENCOUNTER — Telehealth: Payer: Self-pay | Admitting: Cardiology

## 2017-01-02 NOTE — Telephone Encounter (Signed)
Colin Ryan (pt wife) calling, states that she left a medical form at our office yesterday for Dr. Ellyn Hack to [place] out and she would like to know if Colin Ryan received form. Thanks.

## 2017-01-03 NOTE — Telephone Encounter (Signed)
Spoke to wife , inform received form ,  Aware Dr Ellyn Hack WILL BE IN OFFICE NEXT WEEK. VERBALIZED UNDERSTANDING. WILL CONTACT WHEN READY FOR PICK UP

## 2017-01-09 ENCOUNTER — Telehealth: Payer: Self-pay | Admitting: *Deleted

## 2017-01-09 NOTE — Telephone Encounter (Signed)
Spoke to wife informed her letter is ready to be picked up to return to Dr Maylon Peppers Wife states she will pick up tomorrow 01/10/17

## 2017-02-07 ENCOUNTER — Ambulatory Visit: Payer: PPO | Admitting: Cardiology

## 2017-03-06 ENCOUNTER — Ambulatory Visit: Payer: PPO | Admitting: Cardiology

## 2017-03-16 DIAGNOSIS — Z0000 Encounter for general adult medical examination without abnormal findings: Secondary | ICD-10-CM | POA: Diagnosis not present

## 2017-03-16 DIAGNOSIS — Z125 Encounter for screening for malignant neoplasm of prostate: Secondary | ICD-10-CM | POA: Diagnosis not present

## 2017-03-16 DIAGNOSIS — R7309 Other abnormal glucose: Secondary | ICD-10-CM | POA: Diagnosis not present

## 2017-03-16 DIAGNOSIS — E7800 Pure hypercholesterolemia, unspecified: Secondary | ICD-10-CM | POA: Diagnosis not present

## 2017-04-13 ENCOUNTER — Encounter: Payer: Self-pay | Admitting: Cardiology

## 2017-04-13 ENCOUNTER — Ambulatory Visit (INDEPENDENT_AMBULATORY_CARE_PROVIDER_SITE_OTHER): Payer: PPO | Admitting: Cardiology

## 2017-04-13 VITALS — BP 116/80 | HR 57 | Ht 73.0 in | Wt 295.0 lb

## 2017-04-13 DIAGNOSIS — E785 Hyperlipidemia, unspecified: Secondary | ICD-10-CM | POA: Diagnosis not present

## 2017-04-13 DIAGNOSIS — I2510 Atherosclerotic heart disease of native coronary artery without angina pectoris: Secondary | ICD-10-CM | POA: Diagnosis not present

## 2017-04-13 DIAGNOSIS — I10 Essential (primary) hypertension: Secondary | ICD-10-CM | POA: Diagnosis not present

## 2017-04-13 DIAGNOSIS — IMO0001 Reserved for inherently not codable concepts without codable children: Secondary | ICD-10-CM

## 2017-04-13 DIAGNOSIS — E669 Obesity, unspecified: Secondary | ICD-10-CM

## 2017-04-13 DIAGNOSIS — I2119 ST elevation (STEMI) myocardial infarction involving other coronary artery of inferior wall: Secondary | ICD-10-CM | POA: Diagnosis not present

## 2017-04-13 NOTE — Patient Instructions (Signed)
NO CHANGE WITH MEDICATIONS      Your physician wants you to follow-up in [place]. You will receive a reminder letter in the mail two months in advance. If you don't receive a letter, please call our office to schedule the follow-up appointment.    If you need a refill on your cardiac medications before your next appointment, please call your pharmacy.

## 2017-04-13 NOTE — Progress Notes (Signed)
PCP: [name], MD  Clinic Note: Chief Complaint  Patient presents with  . Follow-up  . Coronary Artery Disease    HPI: Colin Ryan is a 66 y.o. male with a PMH below who presents today for early annual follow-up for CAD-CABG for left main and RCA disease 2 years following initial MI with PCI to the RCA. He then PCI to the proximal LAD 2002 prior to his CABG.[name] was last seen on 05/26/2016 for chest pain evaluation. He was having EGD done by gastroenterology but with chest discomfort was recommended to have a stress test done first. He had a burning sensation in the center of his chest not a pressure. Probably GI related. We ordered a Myoview  Recent Hospitalizations: none  Studies Personally Reviewed - (if available, images/films reviewed: From Epic Chart or Care Everywhere)  Myoview October 2017: LOW RISK. EF 52%. Inferior hypokinesis. No reversible ischemia. There is a medium sized moderate intensity fixed inferior inferoseptal perfusion defect suggestive of prior infarct. No change from prior  Interval History: Colin Ryan returns today overall doing very well. He is in very good spirits. He has made a very conscientious effort over the last several months to adjust his diet and pick up his exercise level. He has lost about 25 pounds since starting this new regimen.  He states that he was just getting to the point where he could not breathe and could not do anything because of his obesity. He therefore took it upon himself to start adjusting his diet, cutting out sweets and soft drinks. He is then now getting in at least 3-4 days if not every day exercise of some sort.  He notes that his energy level has significantly improved and he is overall feeling much better. He is not had any chest tightness or pressure with rest or exertion. No PND, orthopnea or edema.   No palpitations, lightheadedness, dizziness, weakness or syncope/near syncope. No TIA/amaurosis fugax symptoms. No  melena, hematochezia, hematuria, or epstaxis. No claudication.  ROS: A comprehensive was performed. Review of Systems  Constitutional: Negative.  Negative for malaise/fatigue.  HENT: Negative for congestion.   Respiratory: Negative for cough, shortness of breath and wheezing.   Gastrointestinal: Negative for abdominal pain, constipation and heartburn.  Musculoskeletal: Negative for joint pain.  Neurological: Negative.   Endo/Heme/Allergies: Does not bruise/bleed easily.  Psychiatric/Behavioral: Negative.   All other systems reviewed and are negative.  I have reviewed and (if needed) personally updated the patient's problem list, medications, allergies, past medical and surgical history, social and family history.   Past Medical History:  Diagnosis Date  . Anxiety   . Arthritis   . Basal cell carcinoma of cheek     left cheek  . CAD S/P percutaneous coronary angioplasty 01/25/2000; 01/2001   INITIAL MI -->PCI to RCA;; 6/'02 Unstable Angina -- PCI-90% pLAD (BMS x 2) 2002; 2003 -->Ostial LM 50%, 90% D1 & AVGCx, 70% ISR in RCA  . Dyslipidemia, goal LDL below 70    On statin. Monitored by PCP  . Exertional dyspnea    Chronic  . GERD (gastroesophageal reflux disease)    Takes Alka-Seltzer PRN  . Hypertension   . Left main coronary artery disease 11/05/01   50+ percent Left Main; 90% D1 and AV groove circumflex, 70% RCA ISR  . Obesity, Class II, BMI 35-39.9, with comorbidity   . S/P CABG x 3 11/06/01   LIMA-LAD, RIMA-RPDA, fLRad-OM  . ST elevation myocardial infarction (STEMI)  of inferior wall, subsequent episode of care ([place]) 01/25/2000   100% mRCA  BMS 3.0 mm x 30 mm; 50% LAD, 80% D1  . Umbilical hernia     Past Surgical History:  Procedure Laterality Date  . CARDIAC CATHETERIZATION  11/05/2001    70% in stent  restenosis in RCA stent, 50+% distal  left main  diesae with patent LAD stent.   . COLONOSCOPY    . CORONARY ARTERY BYPASS GRAFT  11/06/01   LIMA-LAD, RIMA-RCA, [facility]-OM;    . JOINT REPLACEMENT  02/2011   left knee  . mastoid surgery     right ear as a child  . NM MYOVIEW LTD  11/12/2012   INFEROPICAL SCAR  ,EF 55%  . NM MYOVIEW LTD  November 2009; 11/12/2012   No ischemia or infarction (anterior defect read as diaphragmatic attenuation;; March 2014: Inferior scar, EF 55% no ischemia  . PERCUTANEOUS CORONARY STENT INTERVENTION (PCI-S)  01/25/2000   PCI-RCA occlusion; BMS 3.0 mm x 30 mm  . PERCUTANEOUS CORONARY STENT INTERVENTION (PCI-S)  01/30/2001   Proximal LAD- 2 overlapping Velocity BMS stents 3.0 mm x 18 mm, 3.0 mm x 8 mm   . SKIN CANCER EXCISION    . TONSILLECTOMY    . TOTAL KNEE ARTHROPLASTY  11/06/2011   Procedure: TOTAL KNEE ARTHROPLASTY;  Surgeon: Gearlean Alf, MD;  Location: [facility];  Service: Orthopedics;  Laterality: Right;  . TRANSTHORACIC ECHOCARDIOGRAM  01/04/2011   Mild Anterior Hypokinesis, EF 50-55%, Mildly Dilaed Left Atrium, No Significant Valvular Lesions, Some Mld Aortic Sclerosis  . UMBILICAL HERNIA REPAIR  08/22/2012   Procedure: HERNIA REPAIR UMBILICAL ADULT;  Surgeon: Luella Cook III, MD;  Location: [place];  Service: General;  Laterality: N/A;  umbilical hernia repair     Current Meds  Medication Sig  . albuterol (PROVENTIL HFA;VENTOLIN HFA) 108 (90 BASE) MCG/ACT inhaler Inhale 2 puffs into the lungs every 4 (four) hours as needed for wheezing or shortness of breath.  [name] aspirin 81 MG tablet Take 81 mg by mouth daily.  . metoprolol succinate (TOPROL-XL) 100 MG 24 hr tablet Take 1 tablet (100 mg total) by mouth at bedtime. Take with or immediately following a meal.  . nitroGLYCERIN (NITROSTAT) 0.4 MG SL tablet Place 1 tablet (0.4 mg total) under the tongue every 5 (five) minutes as needed. For chest pain  . ramipril (ALTACE) 5 MG capsule Take 1 capsule (5 mg total) by mouth 2 (two) times daily.  . rosuvastatin (CRESTOR) 20 MG tablet Take 1 tablet (20 mg total) by mouth daily.    Allergies  Allergen Reactions  . Lipitor  [Atorvastatin] Other (See Comments)    UNSPECIFIED.   [name] Zocor [Simvastatin] Other (See Comments)    UNSPECIFIED.     Social History   Social History  . Marital status: Married    Spouse name: N/A  . Number of children: N/A  . Years of education: N/A   Social History Main Topics  . Smoking status: Former Smoker    Packs/day: 1.00    Years: 25.00    Quit date: 10/30/1998  . Smokeless tobacco: Never Used  . Alcohol use No  . Drug use: No  . Sexual activity: Not Asked   Other Topics Concern  . None   Social History Narrative   He is a married father of 2.    Still trying to get back into exercise -- just has a hard time keeping up.   He does not  drink, does not smoke; he quit smoking in 2003 shortly after his CABG.     family history includes Alzheimer's disease in his father; Cancer in his mother; Suicidality in his brother.  Wt Readings from Last 3 Encounters:  04/13/17 295 lb (133.8 kg)  06/08/16 (!) 306 lb (138.8 kg)  05/26/16 (!) 306 lb 4.8 oz (138.9 kg)   Had put on weight - up to 312.  Lost 21 lb in 8 wks.  --> cut out sweets & soft drinks.  Gym daily ~45 min - Stationary bike, nautilus machines & PM walks -- just noted too much difficulty with daily activity 2/2 weight - goal is ~250.  PHYSICAL EXAM BP 116/80   Pulse (!) 57   Ht 6\' 1"  (1.854 m)   Wt 295 lb (133.8 kg)   SpO2 92%   BMI 38.92 kg/m  Physical Exam  Constitutional: He is oriented to person, place, and time. He appears well-developed and well-nourished. No distress.  Notably healthy-appearing. Well-groomed.  HENT:  Head: Normocephalic and atraumatic.  Mouth/Throat: No oropharyngeal exudate.  Eyes: Conjunctivae and EOM are normal.  Neck: Normal range of motion. No hepatojugular reflux and no JVD present. Carotid bruit is not present.  Cardiovascular: Regular rhythm and intact distal pulses.  Bradycardia present.  Exam reveals distant heart sounds. Exam reveals no gallop.   Pulmonary/Chest:  Effort normal and breath sounds normal. No respiratory distress. He has no wheezes.  Abdominal: Soft. Bowel sounds are normal. He exhibits no distension. There is no tenderness. There is no rebound.  Musculoskeletal: Normal range of motion. He exhibits no edema.  Neurological: He is alert and oriented to person, place, and time.  Skin: Skin is warm and dry. No rash noted. No erythema.  Psychiatric: He has a normal mood and affect. His behavior is normal. Judgment and thought content normal.  Nursing note and vitals reviewed.    Adult ECG Report  Rate: 57 ;  Rhythm: sinus bradycardia and left axis deviation (-36). Nonspecific ST and T-wave changes. Otherwise normal intervals and durations. No ischemic EKG changes.;   Narrative Interpretation: stable EKG   Other studies Reviewed: Additional studies/ records that were reviewed today include:  Recent Labs:    No results found for: CHOL, HDL, LDLCALC, LDLDIRECT, TRIG, CHOLHDL   ASSESSMENT / PLAN: Problem List Items Addressed This Visit    Coronary artery disease involving left main coronary artery - status post CABG x3 - Primary (Chronic)    No recurrent anginal symptoms. No ischemia on Myoview. On stable regimen. He is still very active, and happy with more energy. I congratulated his efforts. Continue for now current dose of statin which was increased from 10-20 mg daily as well as beta blocker and ACE inhibitor. He is on aspirin but not Plavix.      Relevant Orders   EKG 12-Lead   Dyslipidemia, goal LDL below 70 (Chronic)    As of June - increased Crestor to 20 mg. Should BE due to have lipids checked by PCP soon. Hope would be that if his exercise level continues and he continues to lose weight as he hopes, he will be able to cut back on this dose, but I think for now we will continue with 20 mg daily.      Essential hypertension (Chronic)    Ell-controlled today on current meds. I hope that with cont backing off his ACE inhibitor  to once a day or half a dose twice a day.  H/O ST elevation myocardial infarction (STEMI) of inferior wall -- RCA; inferior infarct on Myoview. (Chronic)    Clear evidence of inferior infarct on Myoview in 2014 and October 2017 but no reversible perfusion defect to suggest ischemia.  No recurrent anginal heart failure symptoms. On aspirin, beta blocker, statin and ACE inhibitor.      Relevant Orders   EKG 12-Lead   Obesity, Class II, BMI 35-39.9, with comorbidity (Chronic)    He had gained quite a bit of weight since I last saw him and then has lost back. He said he got up to 312-316 pounds  --> he is married and notable effort to adjust his diet and is significantly increase his exercise level which he now enjoys doing and continues to increase the amount of what he does.         Current medicines are reviewed at length with the patient today. (+/- concerns) asking about potentially having a reduced medications The following changes have been made: continue current meds for now  Patient Instructions  NO CHANGE WITH MEDICATIONS      Your physician wants you to follow-up in [place]. You will receive a reminder letter in the mail two months in advance. If you don't receive a letter, please call our office to schedule the follow-up appointment.    If you need a refill on your cardiac medications before your next appointment, please call your pharmacy.    Studies Ordered:   Orders Placed This Encounter  Procedures  . EKG 12-Lead      Glenetta Hew, M.D., M.S. Interventional Cardiologist   Pager # [phone] Phone # [phone] [address] [place], [place] [address]

## 2017-04-13 NOTE — Assessment & Plan Note (Addendum)
As of June - increased Crestor to 20 mg. Should BE due to have lipids checked by PCP soon. Hope would be that if his exercise level continues and he continues to lose weight as he hopes, he will be able to cut back on this dose, but I think for now we will continue with 20 mg daily.

## 2017-04-15 ENCOUNTER — Encounter: Payer: Self-pay | Admitting: Cardiology

## 2017-04-15 NOTE — Assessment & Plan Note (Signed)
No recurrent anginal symptoms. No ischemia on Myoview. On stable regimen. He is still very active, and happy with more energy. I congratulated his efforts. Continue for now current dose of statin which was increased from 10-20 mg daily as well as beta blocker and ACE inhibitor. He is on aspirin but not Plavix.

## 2017-04-15 NOTE — Assessment & Plan Note (Signed)
Ell-controlled today on current meds. I hope that with cont backing off his ACE inhibitor to once a day or half a dose twice a day.

## 2017-04-15 NOTE — Assessment & Plan Note (Signed)
Clear evidence of inferior infarct on Myoview in 2014 and October 2017 but no reversible perfusion defect to suggest ischemia.  No recurrent anginal heart failure symptoms. On aspirin, beta blocker, statin and ACE inhibitor.

## 2017-04-15 NOTE — Assessment & Plan Note (Addendum)
He had gained quite a bit of weight since I last saw him and then has lost back. He said he got up to 312-316 pounds  --> he is married and notable effort to adjust his diet and is significantly increase his exercise level which he now enjoys doing and continues to increase the amount of what he does.

## 2017-05-28 DIAGNOSIS — J029 Acute pharyngitis, unspecified: Secondary | ICD-10-CM | POA: Diagnosis not present

## 2017-05-28 DIAGNOSIS — J069 Acute upper respiratory infection, unspecified: Secondary | ICD-10-CM | POA: Diagnosis not present

## 2017-06-28 DIAGNOSIS — Z23 Encounter for immunization: Secondary | ICD-10-CM | POA: Diagnosis not present

## 2017-08-20 ENCOUNTER — Other Ambulatory Visit: Payer: Self-pay | Admitting: *Deleted

## 2017-08-20 MED ORDER — METOPROLOL SUCCINATE ER 100 MG PO TB24: 100.0000 mg | ORAL_TABLET | Freq: Every day | ORAL | 2 refills | Status: DC

## 2017-08-20 MED ORDER — RAMIPRIL 5 MG PO CAPS: 5.0000 mg | ORAL_CAPSULE | Freq: Two times a day (BID) | ORAL | 2 refills | Status: DC

## 2017-11-01 ENCOUNTER — Other Ambulatory Visit: Payer: Self-pay | Admitting: Cardiology

## 2018-01-21 DIAGNOSIS — H9390 Unspecified disorder of ear, unspecified ear: Secondary | ICD-10-CM | POA: Diagnosis not present

## 2018-01-21 DIAGNOSIS — R42 Dizziness and giddiness: Secondary | ICD-10-CM | POA: Diagnosis not present

## 2018-01-21 DIAGNOSIS — L309 Dermatitis, unspecified: Secondary | ICD-10-CM | POA: Diagnosis not present

## 2018-01-21 DIAGNOSIS — R51 Headache: Secondary | ICD-10-CM | POA: Diagnosis not present

## 2018-03-19 DIAGNOSIS — Z125 Encounter for screening for malignant neoplasm of prostate: Secondary | ICD-10-CM | POA: Diagnosis not present

## 2018-03-19 DIAGNOSIS — Z0000 Encounter for general adult medical examination without abnormal findings: Secondary | ICD-10-CM | POA: Diagnosis not present

## 2018-03-19 DIAGNOSIS — E7800 Pure hypercholesterolemia, unspecified: Secondary | ICD-10-CM | POA: Diagnosis not present

## 2018-03-19 DIAGNOSIS — R7309 Other abnormal glucose: Secondary | ICD-10-CM | POA: Diagnosis not present

## 2018-06-21 ENCOUNTER — Telehealth: Payer: Self-pay | Admitting: Cardiology

## 2018-06-21 MED ORDER — NITROGLYCERIN 0.4 MG SL SUBL: 0.4000 mg | SUBLINGUAL_TABLET | SUBLINGUAL | 4 refills | Status: DC | PRN

## 2018-06-21 NOTE — Telephone Encounter (Signed)
Rx request sent to pharmacy.  

## 2018-06-21 NOTE — Telephone Encounter (Signed)
New message    *STAT* If patient is at the pharmacy, call can be transferred to refill team.   1. Which medications need to be refilled? (please list name of each medication and dose if known) nitroGLYCERIN (NITROSTAT) 0.4 MG SL tablet  2. Which pharmacy/location (including street and city if local pharmacy) is medication to be sent to?[id number] - [place], [place] - [address] AT [place]  3. Do they need a 30 day or 90 day supply?[place]

## 2018-07-12 DIAGNOSIS — Z23 Encounter for immunization: Secondary | ICD-10-CM | POA: Diagnosis not present

## 2018-08-05 ENCOUNTER — Other Ambulatory Visit: Payer: Self-pay | Admitting: Cardiology

## 2018-08-05 NOTE — Telephone Encounter (Signed)
Rx request sent to pharmacy.  

## 2018-08-12 ENCOUNTER — Telehealth: Payer: Self-pay | Admitting: Cardiology

## 2018-08-12 NOTE — Telephone Encounter (Signed)
Spoke with pt's wife who states pt has been experiencing SOB on exertion for the past 2-3 weeks. She states he has recently gained weight gradually and has a large abdomen but doesn't know if it could be related to that. She reports a man from their church just recently died from CHF and heart attack which is causing her husband to have anxiety. Appointment scheduled for pt to be seen by Almyra Deforest, PA on 12/27 at 1130 am. Wife instructed to take pt to ED if he symptoms increase. Verbalized understanding.

## 2018-08-12 NOTE — Telephone Encounter (Signed)
° ° ° °  Pt c/o Shortness Of Breath: STAT if SOB developed within the last 24 hours or pt is noticeably SOB on the phone  1. Are you currently SOB (can you hear that pt is SOB on the phone)? No per spouse  2. How long have you been experiencing SOB? 2-3 weeks  3. Are you SOB when sitting or when up moving around? Moving around, o2 92%  4. Are you currently experiencing any other symptoms? Anxiety when he get SOB

## 2018-08-16 ENCOUNTER — Encounter: Payer: Self-pay | Admitting: Physician Assistant

## 2018-08-16 ENCOUNTER — Encounter (INDEPENDENT_AMBULATORY_CARE_PROVIDER_SITE_OTHER): Payer: Self-pay

## 2018-08-16 ENCOUNTER — Ambulatory Visit (INDEPENDENT_AMBULATORY_CARE_PROVIDER_SITE_OTHER): Payer: PPO | Admitting: Physician Assistant

## 2018-08-16 VITALS — BP 146/80 | HR 74 | Ht 73.0 in | Wt 306.8 lb

## 2018-08-16 DIAGNOSIS — I10 Essential (primary) hypertension: Secondary | ICD-10-CM | POA: Diagnosis not present

## 2018-08-16 DIAGNOSIS — E785 Hyperlipidemia, unspecified: Secondary | ICD-10-CM

## 2018-08-16 DIAGNOSIS — R011 Cardiac murmur, unspecified: Secondary | ICD-10-CM

## 2018-08-16 DIAGNOSIS — R0609 Other forms of dyspnea: Secondary | ICD-10-CM

## 2018-08-16 DIAGNOSIS — M79604 Pain in right leg: Secondary | ICD-10-CM

## 2018-08-16 DIAGNOSIS — I25810 Atherosclerosis of coronary artery bypass graft(s) without angina pectoris: Secondary | ICD-10-CM | POA: Diagnosis not present

## 2018-08-16 DIAGNOSIS — M79605 Pain in left leg: Secondary | ICD-10-CM

## 2018-08-16 NOTE — Patient Instructions (Signed)
Medication Instructions:  The current medical regimen is effective;  continue present plan and medications.  If you need a refill on your cardiac medications before your next appointment, please call your pharmacy.   Lab work: CBC today If you have labs (blood work) drawn today and your tests are completely normal, you will receive your results only by: [name] MyChart Message (if you have MyChart) OR . A paper copy in the mail If you have any lab test that is abnormal or we need to change your treatment, we will call you to review the results.  Testing/Procedures: Echocardiogram - Your physician has requested that you have an echocardiogram. Echocardiography is a painless test that uses sound waves to create images of your heart. It provides your doctor with information about the size and shape of your heart and how well your heart's chambers and valves are working. This procedure takes approximately one hour. There are no restrictions for this procedure. This will be performed at our [facility] location - [address].  Your physician has requested that you have a lexiscan myoview. A cardiac stress test is a cardiological test that measures the heart's ability to respond to external stress in a controlled clinical environment. The stress response is induced by intravenous pharmacological stimulation.  Your physician has requested that you have an ankle brachial index (ABI). During this test an ultrasound and blood pressure cuff are used to evaluate the arteries that supply the arms and legs with blood. Allow thirty minutes for this exam. There are no restrictions or special instructions.   Follow-Up: At [facility], you and your health needs are our priority.  As part of our continuing mission to provide you with exceptional heart care, we have created designated Provider Care Teams.  These Care Teams include your primary Cardiologist (physician) and Advanced Practice Providers  (APPs -  Physician Assistants and Nurse Practitioners) who all work together to provide you with the care you need, when you need it. . If your test comes back normal, follow up with Dr.Harding in 12 months.

## 2018-08-16 NOTE — Progress Notes (Signed)
Cardiology Office Note    Date:  08/18/2018   ID:  Colin Ryan, DOB [birth year], MRN [id number]  PCP:  [name], MD  Cardiologist: Dr. Ellyn Hack  Chief Complaint  Patient presents with  . Follow-up    seen for Dr. Ellyn Hack.     History of Present Illness:  Colin Ryan is a 67 y.o. male with PMH of CAD s/p CABG, hypertension, hyperlipidemia and obesity.  Patient had initial MI around 2001.  He also had BMS x2 to LAD in 2002.  He eventually underwent CABG x3 with LIMA to LAD, RIMA to RPDA and left radial to OM in March 2003.  Last stress test in October 2017 showed EF 52%, inferior hypokinesis, no reversible ischemia, medium sized moderate intensity fixed inferior and inferoseptal perfusion defect.  Patient presents today for cardiology of office visit.  He has been noticing some increasing dyspnea on exertion recently.  Otherwise he denies any chest pain.  I will obtain an echocardiogram and Lexiscan stress test.  On physical exam, he does have new heart murmur, however this is very mild and down likely to contribute to his symptoms.  I am concerned of anginal equivalent.  He also complaining of bilateral lower extremity pain, worse on the left side, he is left lower extremity does have posterior tibial pulse but I cannot feel a dorsalis pedis pulse.  I will obtain ABI.  Otherwise he has no significant orthopnea episode but does complain of occasional paroxysmal nocturnal dyspnea.  On physical exam, I do not see any evidence of heart failure or sign of volume overload.  I plan to obtain a CBC to rule out anemia as cause of his symptoms.   Past Medical History:  Diagnosis Date  . Anxiety   . Arthritis   . Basal cell carcinoma of cheek     left cheek  . CAD S/P percutaneous coronary angioplasty 01/25/2000; 01/2001   INITIAL MI -->PCI to RCA;; 6/'02 Unstable Angina -- PCI-90% pLAD (BMS x 2) 2002; 2003 -->Ostial LM 50%, 90% D1 & AVGCx, 70% ISR in RCA  . Dyslipidemia, goal LDL below 70    On statin. Monitored by PCP  . Exertional dyspnea    Chronic  . GERD (gastroesophageal reflux disease)    Takes Alka-Seltzer PRN  . Hypertension   . Left main coronary artery disease 11/05/01   50+ percent Left Main; 90% D1 and AV groove circumflex, 70% RCA ISR  . Obesity, Class II, BMI 35-39.9, with comorbidity   . S/P CABG x 3 11/06/01   LIMA-LAD, RIMA-RPDA, fLRad-OM  . ST elevation myocardial infarction (STEMI) of inferior wall, subsequent episode of care ([place]) 01/25/2000   100% mRCA  BMS 3.0 mm x 30 mm; 50% LAD, 80% D1  . Umbilical hernia     Past Surgical History:  Procedure Laterality Date  . CARDIAC CATHETERIZATION  11/05/2001    70% in stent  restenosis in RCA stent, 50+% distal  left main  diesae with patent LAD stent.   . COLONOSCOPY    . CORONARY ARTERY BYPASS GRAFT  11/06/01   LIMA-LAD, RIMA-RCA, [facility]-OM;   . JOINT REPLACEMENT  02/2011   left knee  . mastoid surgery     right ear as a child  . NM MYOVIEW LTD  11/12/2012   INFEROPICAL SCAR  ,EF 55%  . NM MYOVIEW LTD  November 2009; 11/12/2012   No ischemia or infarction (anterior defect read as diaphragmatic attenuation;; March 2014: Inferior scar, EF  55% no ischemia  . PERCUTANEOUS CORONARY STENT INTERVENTION (PCI-S)  01/25/2000   PCI-RCA occlusion; BMS 3.0 mm x 30 mm  . PERCUTANEOUS CORONARY STENT INTERVENTION (PCI-S)  01/30/2001   Proximal LAD- 2 overlapping Velocity BMS stents 3.0 mm x 18 mm, 3.0 mm x 8 mm   . SKIN CANCER EXCISION    . TONSILLECTOMY    . TOTAL KNEE ARTHROPLASTY  11/06/2011   Procedure: TOTAL KNEE ARTHROPLASTY;  Surgeon: Gearlean Alf, MD;  Location: [facility];  Service: Orthopedics;  Laterality: Right;  . TRANSTHORACIC ECHOCARDIOGRAM  01/04/2011   Mild Anterior Hypokinesis, EF 50-55%, Mildly Dilaed Left Atrium, No Significant Valvular Lesions, Some Mld Aortic Sclerosis  . UMBILICAL HERNIA REPAIR  08/22/2012   Procedure: HERNIA REPAIR UMBILICAL ADULT;  Surgeon: Merrie Roof, MD;  Location: [facility];   Service: General;  Laterality: N/A;  umbilical hernia repair     Current Medications: Outpatient Medications Prior to Visit  Medication Sig Dispense Refill  . albuterol (PROVENTIL HFA;VENTOLIN HFA) 108 (90 BASE) MCG/ACT inhaler Inhale 2 puffs into the lungs every 4 (four) hours as needed for wheezing or shortness of breath. 3.7 g 0  . aspirin 81 MG tablet Take 81 mg by mouth daily.    . metoprolol succinate (TOPROL-XL) 100 MG 24 hr tablet Take 1 tablet (100 mg total) by mouth at bedtime. Take with or immediately following a meal. Please schedule appointment for refills. 30 tablet 0  . nitroGLYCERIN (NITROSTAT) 0.4 MG SL tablet Place 1 tablet (0.4 mg total) under the tongue every 5 (five) minutes as needed. For chest pain. Please schedule appointment for refills. 25 tablet 4  . ramipril (ALTACE) 5 MG capsule Take 1 capsule (5 mg total) by mouth 2 (two) times daily. Please schedule appointment for refills. 60 capsule 0  . rosuvastatin (CRESTOR) 20 MG tablet Take 1 tablet (20 mg total) by mouth daily. Please schedule appointment for refills. 30 tablet 0   No facility-administered medications prior to visit.      Allergies:   Lipitor [atorvastatin] and Zocor [simvastatin]   Social History   Socioeconomic History  . Marital status: Married    Spouse name: Not on file  . Number of children: Not on file  . Years of education: Not on file  . Highest education level: Not on file  Occupational History  . Not on file  Social Needs  . Financial resource strain: Not on file  . Food insecurity:    Worry: Not on file    Inability: Not on file  . Transportation needs:    Medical: Not on file    Non-medical: Not on file  Tobacco Use  . Smoking status: Former Smoker    Packs/day: 1.00    Years: 25.00    Pack years: 25.00    Last attempt to quit: 10/30/1998    Years since quitting: 19.8  . Smokeless tobacco: Never Used  Substance and Sexual Activity  . Alcohol use: No  . Drug use: No  .  Sexual activity: Not on file  Lifestyle  . Physical activity:    Days per week: Not on file    Minutes per session: Not on file  . Stress: Not on file  Relationships  . Social connections:    Talks on phone: Not on file    Gets together: Not on file    Attends religious service: Not on file    Active member of club or organization: Not on file  Attends meetings of clubs or organizations: Not on file    Relationship status: Not on file  Other Topics Concern  . Not on file  Social History Narrative   He is a married father of 2.    Still trying to get back into exercise -- just has a hard time keeping up.   He does not drink, does not smoke; he quit smoking in 2003 shortly after his CABG.      Family History:  The patient's family history includes Alzheimer's disease in his father; Cancer in his mother; Suicidality in his brother.   ROS:   Please see the history of present illness.    ROS All other systems reviewed and are negative.   PHYSICAL EXAM:   VS:  BP (!) 146/80   Pulse 74   Ht 6\' 1"  (1.854 m)   Wt (!) 306 lb 12.8 oz (139.2 kg)   BMI 40.48 kg/m    GEN: Well nourished, well developed, in no acute distress  HEENT: normal  Neck: no JVD, carotid bruits, or masses Cardiac: RRR; no rubs, or gallops,no edema  2/6 murmur Respiratory:  clear to auscultation bilaterally, normal work of breathing GI: soft, nontender, nondistended, + BS MS: no deformity or atrophy  Skin: warm and dry, no rash Neuro:  Alert and Oriented x 3, Strength and sensation are intact Psych: euthymic mood, full affect  Wt Readings from Last 3 Encounters:  08/16/18 (!) 306 lb 12.8 oz (139.2 kg)  04/13/17 295 lb (133.8 kg)  06/08/16 (!) 306 lb (138.8 kg)      Studies/Labs Reviewed:   EKG:  EKG is ordered today.  The ekg ordered today demonstrates normal sinus rhythm with occasional PAC.  Recent Labs: 08/16/2018: Hemoglobin 16.2; Platelets 259   Lipid Panel No results found for: CHOL,  TRIG, HDL, CHOLHDL, VLDL, LDLCALC, LDLDIRECT  Additional studies/ records that were reviewed today include:   Myoview 06/09/2016 Study Highlights     The left ventricular ejection fraction is mildly decreased (45-54%).  Nuclear stress EF: 53%.  No T wave inversion was noted during stress.  There was no ST segment deviation noted during stress.  Defect 1: There is a medium defect of moderate severity.  This is a low risk study.   Medium size and moderate intensity, fixed inferior and inferoseptal perfusion defect suggestive of scar. There is no significant reversible ischemia. LVEF 53% with inferior hypokinesis. This is a low risk study.       ASSESSMENT:    1. Dyspnea on exertion   2. Pain in both lower extremities   3. Coronary artery disease involving coronary bypass graft of native heart without angina pectoris   4. Essential hypertension   5. Hyperlipidemia LDL goal <70      PLAN:  In order of problems listed above:  1. Dyspnea on exertion: I am concerned that his dyspnea on exertion is anginal equivalent.  I will obtain a stress test.  Last stress test in October 2017 showed no ischemia, however inferolateral scar.  2. Lower extremity pain: Worse on the left, worse with ambulation.  I will obtain ABI.  3. Heart murmur: Obtain echocardiogram  4. CAD s/p CABG: On aspirin and Crestor  5. Hypertension: Blood pressure has been borderline today.  Will reassess on follow-up  6. Hyperlipidemia: On Crestor 20 mg daily.  Continue on current medication    Medication Adjustments/Labs and Tests Ordered: Current medicines are reviewed at length with the patient today.  Concerns  regarding medicines are outlined above.  Medication changes, Labs and Tests ordered today are listed in the Patient Instructions below. Patient Instructions  Medication Instructions:  The current medical regimen is effective;  continue present plan and medications.  If you need a refill on  your cardiac medications before your next appointment, please call your pharmacy.   Lab work: CBC today If you have labs (blood work) drawn today and your tests are completely normal, you will receive your results only by: [name] MyChart Message (if you have MyChart) OR . A paper copy in the mail If you have any lab test that is abnormal or we need to change your treatment, we will call you to review the results.  Testing/Procedures: Echocardiogram - Your physician has requested that you have an echocardiogram. Echocardiography is a painless test that uses sound waves to create images of your heart. It provides your doctor with information about the size and shape of your heart and how well your heart's chambers and valves are working. This procedure takes approximately one hour. There are no restrictions for this procedure. This will be performed at our [facility] location - [address].  Your physician has requested that you have a lexiscan myoview. A cardiac stress test is a cardiological test that measures the heart's ability to respond to external stress in a controlled clinical environment. The stress response is induced by intravenous pharmacological stimulation.  Your physician has requested that you have an ankle brachial index (ABI). During this test an ultrasound and blood pressure cuff are used to evaluate the arteries that supply the arms and legs with blood. Allow thirty minutes for this exam. There are no restrictions or special instructions.   Follow-Up: At [facility], you and your health needs are our priority.  As part of our continuing mission to provide you with exceptional heart care, we have created designated Provider Care Teams.  These Care Teams include your primary Cardiologist (physician) and Advanced Practice Providers (APPs -  Physician Assistants and Nurse Practitioners) who all work together to provide you with the care you need, when you need it. . If  your test comes back normal, follow up with Dr.Harding in 12 months.       Hilbert Corrigan, [place]  08/18/2018 11:46 PM    [facility] [address], [place], [place]  [address] Phone: [phone]; Fax: [phone]

## 2018-08-17 LAB — CBC
Hematocrit: 47.3 % (ref 37.5–51.0)
Hemoglobin: 16.2 g/dL (ref 13.0–17.7)
MCH: 31.1 pg (ref 26.6–33.0)
MCHC: 34.2 g/dL (ref 31.5–35.7)
MCV: 91 fL (ref 79–97)
Platelets: 259 10*3/uL (ref 150–450)
RBC: 5.21 x10E6/uL (ref 4.14–5.80)
RDW: 12.3 % (ref 12.3–15.4)
WBC: 8.6 10*3/uL (ref 3.4–10.8)

## 2018-08-18 ENCOUNTER — Encounter: Payer: Self-pay | Admitting: Physician Assistant

## 2018-08-19 NOTE — Progress Notes (Signed)
Red blood cell count normal

## 2018-08-23 ENCOUNTER — Other Ambulatory Visit: Payer: Self-pay | Admitting: Physician Assistant

## 2018-08-23 ENCOUNTER — Other Ambulatory Visit (HOSPITAL_COMMUNITY): Payer: Self-pay | Admitting: Physician Assistant

## 2018-08-23 ENCOUNTER — Other Ambulatory Visit (HOSPITAL_COMMUNITY): Payer: PPO

## 2018-08-23 DIAGNOSIS — I739 Peripheral vascular disease, unspecified: Secondary | ICD-10-CM

## 2018-08-27 ENCOUNTER — Other Ambulatory Visit: Payer: Self-pay

## 2018-08-27 ENCOUNTER — Other Ambulatory Visit: Payer: Self-pay | Admitting: Physician Assistant

## 2018-08-27 ENCOUNTER — Ambulatory Visit (HOSPITAL_COMMUNITY): Payer: PPO | Attending: Cardiology

## 2018-08-27 ENCOUNTER — Telehealth (HOSPITAL_COMMUNITY): Payer: Self-pay

## 2018-08-27 DIAGNOSIS — R0609 Other forms of dyspnea: Secondary | ICD-10-CM | POA: Diagnosis not present

## 2018-08-27 DIAGNOSIS — I739 Peripheral vascular disease, unspecified: Secondary | ICD-10-CM

## 2018-08-27 HISTORY — PX: TRANSTHORACIC ECHOCARDIOGRAM: SHX275

## 2018-08-27 NOTE — Telephone Encounter (Signed)
Encounter complete. 

## 2018-08-29 ENCOUNTER — Encounter (HOSPITAL_COMMUNITY): Payer: PPO

## 2018-08-29 ENCOUNTER — Ambulatory Visit (HOSPITAL_COMMUNITY)
Admission: RE | Admit: 2018-08-29 | Discharge: 2018-08-29 | Disposition: A | Discharge status: Home / Self Care | Payer: PPO | Source: Ambulatory Visit | Attending: Cardiovascular Disease | Admitting: Cardiovascular Disease

## 2018-08-29 DIAGNOSIS — R0609 Other forms of dyspnea: Secondary | ICD-10-CM | POA: Diagnosis not present

## 2018-08-29 HISTORY — PX: NM MYOVIEW LTD: HXRAD82

## 2018-08-29 MED ORDER — AMINOPHYLLINE 25 MG/ML IV SOLN
75.0000 mg | Freq: Once | INTRAVENOUS | Status: AC
  Administered 2018-08-29: 75 mg via INTRAVENOUS

## 2018-08-29 MED ORDER — REGADENOSON 0.4 MG/5ML IV SOLN
0.4000 mg | Freq: Once | INTRAVENOUS | Status: AC
  Administered 2018-08-29: 0.4 mg via INTRAVENOUS

## 2018-08-29 MED ORDER — TECHNETIUM TC 99M TETROFOSMIN IV KIT
31.6000 | PACK | Freq: Once | INTRAVENOUS | Status: AC | PRN
  Administered 2018-08-29: 31.6 via INTRAVENOUS
  Filled 2018-08-29: qty 32

## 2018-08-30 ENCOUNTER — Ambulatory Visit (HOSPITAL_BASED_OUTPATIENT_CLINIC_OR_DEPARTMENT_OTHER)
Admission: RE | Admit: 2018-08-30 | Discharge: 2018-08-30 | Disposition: A | Discharge status: Home / Self Care | Payer: PPO | Source: Ambulatory Visit | Attending: Physician Assistant | Admitting: Physician Assistant

## 2018-08-30 ENCOUNTER — Ambulatory Visit (HOSPITAL_COMMUNITY)
Admission: RE | Admit: 2018-08-30 | Discharge: 2018-08-30 | Disposition: A | Discharge status: Home / Self Care | Payer: PPO | Source: Ambulatory Visit | Attending: Cardiovascular Disease | Admitting: Cardiovascular Disease

## 2018-08-30 DIAGNOSIS — I739 Peripheral vascular disease, unspecified: Secondary | ICD-10-CM

## 2018-08-30 LAB — MYOCARDIAL PERFUSION IMAGING
CHL CUP NUCLEAR SDS: 4
LV dias vol: 168 mL (ref 62–150)
LV sys vol: 92 mL
Peak HR: 78 {beats}/min
Rest HR: 65 {beats}/min
SRS: 9
SSS: 13
TID: 0.95

## 2018-08-30 MED ORDER — TECHNETIUM TC 99M TETROFOSMIN IV KIT
28.8000 | PACK | Freq: Once | INTRAVENOUS | Status: AC | PRN
  Administered 2018-08-30: 28.8 via INTRAVENOUS

## 2018-08-30 NOTE — Progress Notes (Signed)
No significant reversible blockage noted on this stress test. Overall, low risk stress test. When combined with recent normal echo, no further workup is needed.

## 2018-08-30 NOTE — Progress Notes (Signed)
Lower extremity arterial disease noted, although patient says his left leg hurts more, the ultrasound actually shows right leg has more significant arterial disease. Recommend outpatient eval by Dr. Gwenlyn Found or Dr. Fletcher Anon

## 2018-09-10 ENCOUNTER — Ambulatory Visit (INDEPENDENT_AMBULATORY_CARE_PROVIDER_SITE_OTHER): Payer: PPO | Admitting: Cardiovascular Disease

## 2018-09-10 ENCOUNTER — Encounter: Payer: Self-pay | Admitting: Cardiovascular Disease

## 2018-09-10 VITALS — BP 132/68 | HR 61 | Ht 73.0 in | Wt 304.0 lb

## 2018-09-10 DIAGNOSIS — I739 Peripheral vascular disease, unspecified: Secondary | ICD-10-CM | POA: Insufficient documentation

## 2018-09-10 DIAGNOSIS — G4733 Obstructive sleep apnea (adult) (pediatric): Secondary | ICD-10-CM

## 2018-09-10 DIAGNOSIS — R0683 Snoring: Secondary | ICD-10-CM | POA: Diagnosis not present

## 2018-09-10 DIAGNOSIS — R400 Somnolence: Secondary | ICD-10-CM

## 2018-09-10 HISTORY — DX: Obstructive sleep apnea (adult) (pediatric): G47.33

## 2018-09-10 NOTE — Assessment & Plan Note (Signed)
Colin Ryan was referred to me by Almyra Deforest PA-C for evaluation of PAD.  He has a history of CAD status post bypass grafting, 70-pack-year tobacco abuse having quit 15 years ago and pain in his legs when he walks left greater than right.  He is had bilateral total knee replacements.  His legs do not bother him on a recumbent bicycle or treadmill but they do when he walks.  He also has nocturnal hip pain.  Recent Doppler studies from 09/01/2018 revealing a right ABI 0.62 with occluded right SFA and a normal left ABI.  He had triphasic femoral waveforms.  He really does not describe right calf claudication.  I suspect his pain is non-vascular

## 2018-09-10 NOTE — Patient Instructions (Addendum)
Medication Instructions:  NONE If you need a refill on your cardiac medications before your next appointment, please call your pharmacy.   Lab work: NONE If you have labs (blood work) drawn today and your tests are completely normal, you will receive your results only by: [name] MyChart Message (if you have MyChart) OR . A paper copy in the mail If you have any lab test that is abnormal or we need to change your treatment, we will call you to review the results.  Testing/Procedures: Your physician has recommended that you have a sleep study. This test records several body functions during sleep, including: brain activity, eye movement, oxygen and carbon dioxide blood levels, heart rate and rhythm, breathing rate and rhythm, the flow of air through your mouth and nose, snoring, body muscle movements, and chest and belly movement.   Follow-Up: At [facility], you and your health needs are our priority.  As part of our continuing mission to provide you with exceptional heart care, we have created designated Provider Care Teams.  These Care Teams include your primary Cardiologist (physician) and Advanced Practice Providers (APPs -  Physician Assistants and Nurse Practitioners) who all work together to provide you with the care you need, when you need it. You will need a follow up appointment in 3 months Almyra Deforest, [place].  You may schedule an appointment with Dr. Gwenlyn Found as needed.   Any Other Special Instructions Will Be Listed Below (If Applicable). Referral to Outpatient Sleep Study

## 2018-09-10 NOTE — Progress Notes (Signed)
09/10/2018 Colin Ryan   [birth year]  [id number]  Primary Physician [name], MD Primary Cardiologist: Lorretta Harp MD Lupe Carney, [place]  HPI:  Colin Ryan is a 68 y.o. morbidly overweight married Caucasian male father of 2 children who is accompanied by his wife [name].  I apparently did a heart cath on [name] 8 years ago.  He has a history of 70 pack years of tobacco abuse having quit 15 years ago, treated hypertension and hyperlipidemia.  He has had an MI back in 2001 treated with stenting by Dr. Tami Ribas and subsequent bypass grafting.  He is noted increasing shortness of breath recently and had a 2D echo that was unremarkable and a nonischemic Myoview.  He does complain of bilateral leg pain left greater than right.  He has had bilateral knee replacements by Dr. Ricki Rodriguez.  His legs do not hurt him when he is riding a recumbent bicycle or walking on a treadmill but they do when he is walking on a flat surface.  Recent Doppler studies performed 09/01/2018 revealed a right ABI 0.72 with an occluded right SFA and normal left ABI.  He also complains of usual hip pain mostly at night when he is lying on his hip.   Current Meds  Medication Sig  . albuterol (PROVENTIL HFA;VENTOLIN HFA) 108 (90 BASE) MCG/ACT inhaler Inhale 2 puffs into the lungs every 4 (four) hours as needed for wheezing or shortness of breath.  [name] aspirin 81 MG tablet Take 81 mg by mouth daily.  . metoprolol succinate (TOPROL-XL) 100 MG 24 hr tablet Take 1 tablet (100 mg total) by mouth at bedtime. Take with or immediately following a meal. Please schedule appointment for refills.  . nitroGLYCERIN (NITROSTAT) 0.4 MG SL tablet Place 1 tablet (0.4 mg total) under the tongue every 5 (five) minutes as needed. For chest pain. Please schedule appointment for refills.  . ramipril (ALTACE) 5 MG capsule Take 1 capsule (5 mg total) by mouth 2 (two) times daily. Please schedule appointment for refills.  . rosuvastatin (CRESTOR)  20 MG tablet Take 1 tablet (20 mg total) by mouth daily. Please schedule appointment for refills.     Allergies  Allergen Reactions  . Lipitor [Atorvastatin] Other (See Comments)    UNSPECIFIED.   [name] Zocor [Simvastatin] Other (See Comments)    UNSPECIFIED.     Social History   Socioeconomic History  . Marital status: Married    Spouse name: Not on file  . Number of children: Not on file  . Years of education: Not on file  . Highest education level: Not on file  Occupational History  . Not on file  Social Needs  . Financial resource strain: Not on file  . Food insecurity:    Worry: Not on file    Inability: Not on file  . Transportation needs:    Medical: Not on file    Non-medical: Not on file  Tobacco Use  . Smoking status: Former Smoker    Packs/day: 1.00    Years: 25.00    Pack years: 25.00    Last attempt to quit: 10/30/1998    Years since quitting: 19.8  . Smokeless tobacco: Never Used  Substance and Sexual Activity  . Alcohol use: No  . Drug use: No  . Sexual activity: Not on file  Lifestyle  . Physical activity:    Days per week: Not on file    Minutes per session: Not on file  .  Stress: Not on file  Relationships  . Social connections:    Talks on phone: Not on file    Gets together: Not on file    Attends religious service: Not on file    Active member of club or organization: Not on file    Attends meetings of clubs or organizations: Not on file    Relationship status: Not on file  . Intimate partner violence:    Fear of current or ex partner: Not on file    Emotionally abused: Not on file    Physically abused: Not on file    Forced sexual activity: Not on file  Other Topics Concern  . Not on file  Social History Narrative   He is a married father of 2.    Still trying to get back into exercise -- just has a hard time keeping up.   He does not drink, does not smoke; he quit smoking in 2003 shortly after his CABG.      Review of  Systems: General: negative for chills, fever, night sweats or weight changes.  Cardiovascular: negative for chest pain, dyspnea on exertion, edema, orthopnea, palpitations, paroxysmal nocturnal dyspnea or shortness of breath Dermatological: negative for rash Respiratory: negative for cough or wheezing Urologic: negative for hematuria Abdominal: negative for nausea, vomiting, diarrhea, bright red blood per rectum, melena, or hematemesis Neurologic: negative for visual changes, syncope, or dizziness All other systems reviewed and are otherwise negative except as noted above.    Blood pressure 132/68, pulse 61, height 6\' 1"  (1.854 m), weight (!) 304 lb (137.9 kg).  General appearance: alert and no distress Neck: no adenopathy, no carotid bruit, no JVD, supple, symmetrical, trachea midline and thyroid not enlarged, symmetric, no tenderness/mass/nodules Lungs: clear to auscultation bilaterally Heart: regular rate and rhythm, S1, S2 normal, no murmur, click, rub or gallop Extremities: extremities normal, atraumatic, no cyanosis or edema Pulses: Absent right pedal pulse Skin: Skin color, texture, turgor normal. No rashes or lesions Neurologic: Alert and oriented X 3, normal strength and tone. Normal symmetric reflexes. Normal coordination and gait  EKG not performed today  ASSESSMENT AND PLAN:   Peripheral arterial disease [facility]) Mr. Handshoe was referred to me by Almyra Deforest PA-C for evaluation of PAD.  He has a history of CAD status post bypass grafting, 70-pack-year tobacco abuse having quit 15 years ago and pain in his legs when he walks left greater than right.  He is had bilateral total knee replacements.  His legs do not bother him on a recumbent bicycle or treadmill but they do when he walks.  He also has nocturnal hip pain.  Recent Doppler studies from 09/01/2018 revealing a right ABI 0.62 with occluded right SFA and a normal left ABI.  He had triphasic femoral waveforms.  He really does not  describe right calf claudication.  I suspect his pain is non-vascular  Obstructive sleep apnea Mr. Brink his symptoms of nocturnal snoring and daytime somnolence.  Given his body habitus I think it is certainly possible that he has obstructive sleep apnea.  We will obtain a outpatient sleep study to further evaluate.      Lorretta Harp MD FACP,FACC,FAHA, [facility] 09/10/2018 4:19 PM

## 2018-09-10 NOTE — Assessment & Plan Note (Signed)
Colin Ryan his symptoms of nocturnal snoring and daytime somnolence.  Given his body habitus I think it is certainly possible that he has obstructive sleep apnea.  We will obtain a outpatient sleep study to further evaluate.

## 2018-09-11 ENCOUNTER — Telehealth: Payer: Self-pay | Admitting: Cardiovascular Disease

## 2018-09-11 ENCOUNTER — Telehealth: Payer: Self-pay | Admitting: *Deleted

## 2018-09-11 NOTE — Telephone Encounter (Signed)
Dr. Kennon Holter note suggest that although he likely has blockage in his leg, his symptom is inconsistent with such blockage. Dr. Gwenlyn Found recommend observation for now instead of proceeding with invasive workup which may offer more risk than benefit. I can see him in Apr, but CTA scan will be 6 month. We encourage continuous activity as tolerated.

## 2018-09-11 NOTE — Telephone Encounter (Signed)
° ° °  Patient has additional questions regarding 1/21 ov. Questions about possible stent and aorta root.

## 2018-09-11 NOTE — Telephone Encounter (Signed)
PA submitted to [facility] for sleep study via web portal. 

## 2018-09-11 NOTE — Telephone Encounter (Signed)
DPR on file. Spoke to pt wife who requested clarification on plan for pt.  1. Pt wife states that she reviewed the pt's LEA results on mychart and wants to know why the results indicate that pt right SFA is occluded, but Dr. Gwenlyn Found does not want to put a stent in. Informed wife that Dr. Gwenlyn Found OV note from 1/21 states "Recent Doppler studies from 09/01/2018 revealing a right ABI 0.62 with occluded right SFA and a normal left ABI.  He had triphasic femoral waveforms. He really does not describe right calf claudication.  I suspect his pain is non-vascular" and would forward her concerns to Dr. Gwenlyn Found.   2. Pt wife states that the echocardiogram result note in mychart recommended f/u image with CT angiogram in 6 months, but during the OV Dr. Gwenlyn Found advised to f/u with Almyra Deforest, PA in 3 months. Informed pt wife that the pt is scheduled for a f/u appt in 11/2018, but to expect CT angiogram for aortic root in 6 months per result note by Almyra Deforest, PA. Informed pt wife that she can discuss this further with Almyra Deforest, PA during [place] in 11/2018.  3. Pt wife states that pt wanted to know about any limitations in physical activity and/or exercise restrictions related to echo results. Will route to [place], [place]

## 2018-09-11 NOTE — Telephone Encounter (Signed)
Advised pt that per Almyra Deforest, PA,  "Dr. Kennon Holter note suggest that although he likely has blockage in his leg, his symptom is inconsistent with such blockage. Dr. Gwenlyn Found recommend observation for now instead of proceeding with invasive workup which may offer more risk than benefit. I can see him in Apr, but CTA scan will be 6 month. We encourage continuous activity as tolerated."  Pt wife verbalized understanding and will relay info to pt

## 2018-09-11 NOTE — Telephone Encounter (Signed)
-----   Message from Roland Earl sent at 09/10/2018  4:30 PM EST ----- Regarding: Sleep Study

## 2018-09-12 ENCOUNTER — Other Ambulatory Visit: Payer: Self-pay | Admitting: Cardiovascular Disease

## 2018-09-12 ENCOUNTER — Telehealth: Payer: Self-pay | Admitting: *Deleted

## 2018-09-12 DIAGNOSIS — R400 Somnolence: Secondary | ICD-10-CM

## 2018-09-12 DIAGNOSIS — I10 Essential (primary) hypertension: Secondary | ICD-10-CM

## 2018-09-12 DIAGNOSIS — R0683 Snoring: Secondary | ICD-10-CM

## 2018-09-12 NOTE — Telephone Encounter (Signed)
Left message to return a call to discuss sleep study appointment. 

## 2018-09-12 NOTE — Telephone Encounter (Signed)
Patient returned a call and was given HST appointment details.

## 2018-09-25 DIAGNOSIS — Z87891 Personal history of nicotine dependence: Secondary | ICD-10-CM | POA: Diagnosis not present

## 2018-09-25 DIAGNOSIS — R0602 Shortness of breath: Secondary | ICD-10-CM | POA: Diagnosis not present

## 2018-09-25 DIAGNOSIS — F411 Generalized anxiety disorder: Secondary | ICD-10-CM | POA: Diagnosis not present

## 2018-09-26 ENCOUNTER — Telehealth: Payer: Self-pay | Admitting: Cardiology

## 2018-09-26 MED ORDER — METOPROLOL SUCCINATE ER 100 MG PO TB24: 100.0000 mg | ORAL_TABLET | Freq: Every day | ORAL | 1 refills | Status: DC

## 2018-09-26 MED ORDER — ROSUVASTATIN CALCIUM 20 MG PO TABS: 20.0000 mg | ORAL_TABLET | Freq: Every day | ORAL | 1 refills | Status: DC

## 2018-09-26 MED ORDER — RAMIPRIL 5 MG PO CAPS: 5.0000 mg | ORAL_CAPSULE | Freq: Two times a day (BID) | ORAL | 1 refills | Status: DC

## 2018-09-26 NOTE — Telephone Encounter (Signed)
New message      *STAT* If patient is at the pharmacy, call can be transferred to refill team.   1. Which medications need to be refilled? (please list name of each medication and dose if known)   metoprolol succinate (TOPROL-XL) 100 MG 24 hr tablet   ramipril (ALTACE) 5 MG capsule  rosuvastatin (CRESTOR) 20 MG tablet   2. Which pharmacy/location (including street and city if local pharmacy) is medication to be sent to?  [id number] - [place], [place] - [address] AT [place]      3. Do they need a 30 day or 90 day supply? [place]

## 2018-09-27 ENCOUNTER — Encounter (HOSPITAL_BASED_OUTPATIENT_CLINIC_OR_DEPARTMENT_OTHER): Payer: PPO

## 2018-09-30 ENCOUNTER — Other Ambulatory Visit: Payer: Self-pay | Admitting: Family Medicine

## 2018-09-30 DIAGNOSIS — R0602 Shortness of breath: Secondary | ICD-10-CM

## 2018-09-30 DIAGNOSIS — Z87891 Personal history of nicotine dependence: Secondary | ICD-10-CM

## 2018-10-01 ENCOUNTER — Ambulatory Visit
Admission: RE | Admit: 2018-10-01 | Discharge: 2018-10-01 | Disposition: A | Discharge status: Home / Self Care | Payer: PPO | Source: Ambulatory Visit | Attending: Family Medicine | Admitting: Family Medicine

## 2018-10-01 DIAGNOSIS — Z87891 Personal history of nicotine dependence: Secondary | ICD-10-CM

## 2018-10-01 DIAGNOSIS — R0602 Shortness of breath: Secondary | ICD-10-CM

## 2018-10-01 DIAGNOSIS — J432 Centrilobular emphysema: Secondary | ICD-10-CM | POA: Diagnosis not present

## 2018-10-18 DIAGNOSIS — L821 Other seborrheic keratosis: Secondary | ICD-10-CM | POA: Diagnosis not present

## 2018-10-18 DIAGNOSIS — L918 Other hypertrophic disorders of the skin: Secondary | ICD-10-CM | POA: Diagnosis not present

## 2018-10-18 DIAGNOSIS — D485 Neoplasm of uncertain behavior of skin: Secondary | ICD-10-CM | POA: Diagnosis not present

## 2018-10-18 DIAGNOSIS — D225 Melanocytic nevi of trunk: Secondary | ICD-10-CM | POA: Diagnosis not present

## 2018-10-18 DIAGNOSIS — L853 Xerosis cutis: Secondary | ICD-10-CM | POA: Diagnosis not present

## 2018-10-18 DIAGNOSIS — C44529 Squamous cell carcinoma of skin of other part of trunk: Secondary | ICD-10-CM | POA: Diagnosis not present

## 2018-10-18 DIAGNOSIS — D1801 Hemangioma of skin and subcutaneous tissue: Secondary | ICD-10-CM | POA: Diagnosis not present

## 2018-10-18 DIAGNOSIS — Z85828 Personal history of other malignant neoplasm of skin: Secondary | ICD-10-CM | POA: Diagnosis not present

## 2018-10-18 DIAGNOSIS — L570 Actinic keratosis: Secondary | ICD-10-CM | POA: Diagnosis not present

## 2018-10-18 DIAGNOSIS — L218 Other seborrheic dermatitis: Secondary | ICD-10-CM | POA: Diagnosis not present

## 2018-10-30 DIAGNOSIS — Z85828 Personal history of other malignant neoplasm of skin: Secondary | ICD-10-CM | POA: Diagnosis not present

## 2018-10-30 DIAGNOSIS — C44529 Squamous cell carcinoma of skin of other part of trunk: Secondary | ICD-10-CM | POA: Diagnosis not present

## 2018-12-10 ENCOUNTER — Ambulatory Visit: Payer: PPO | Admitting: Physician Assistant

## 2018-12-31 ENCOUNTER — Telehealth: Payer: Self-pay | Admitting: Physician Assistant

## 2018-12-31 NOTE — Telephone Encounter (Signed)
LVM for patient to let us know if he wants a video or phone visit.

## 2019-01-16 ENCOUNTER — Telehealth: Payer: PPO | Admitting: Physician Assistant

## 2019-03-19 ENCOUNTER — Telehealth: Payer: Self-pay | Admitting: Cardiology

## 2019-03-19 MED ORDER — RAMIPRIL 5 MG PO CAPS: 5.0000 mg | ORAL_CAPSULE | Freq: Two times a day (BID) | ORAL | 3 refills | Status: DC

## 2019-03-19 MED ORDER — METOPROLOL SUCCINATE ER 100 MG PO TB24: 100.0000 mg | ORAL_TABLET | Freq: Every day | ORAL | 3 refills | Status: DC

## 2019-03-19 MED ORDER — ROSUVASTATIN CALCIUM 20 MG PO TABS: 20.0000 mg | ORAL_TABLET | Freq: Every day | ORAL | 3 refills | Status: DC

## 2019-03-19 MED ORDER — NITROGLYCERIN 0.4 MG SL SUBL: 0.4000 mg | SUBLINGUAL_TABLET | SUBLINGUAL | 1 refills | Status: DC | PRN

## 2019-03-19 NOTE — Telephone Encounter (Signed)
Refills placed for the patient. Recall to see Dr. Ellyn Hack has been placed. The wife has been advised to call the office if anything is needed prior to the appointment.

## 2019-03-19 NOTE — Telephone Encounter (Signed)
New Message    Colin Ryan is the pts husband and she is calling about the pts prescription, Metoprolol, Rosuvastatin, and Ramipril and needs them sent in for a year supply  She asked for Ivin Booty to call her back

## 2019-06-02 DIAGNOSIS — Z23 Encounter for immunization: Secondary | ICD-10-CM | POA: Diagnosis not present

## 2019-09-29 ENCOUNTER — Other Ambulatory Visit: Payer: Self-pay | Admitting: Family Medicine

## 2019-09-29 DIAGNOSIS — R911 Solitary pulmonary nodule: Secondary | ICD-10-CM

## 2019-10-28 ENCOUNTER — Encounter: Payer: Self-pay | Admitting: General Practice

## 2019-11-10 DIAGNOSIS — Z125 Encounter for screening for malignant neoplasm of prostate: Secondary | ICD-10-CM | POA: Diagnosis not present

## 2019-11-10 DIAGNOSIS — R7309 Other abnormal glucose: Secondary | ICD-10-CM | POA: Diagnosis not present

## 2019-11-10 DIAGNOSIS — F411 Generalized anxiety disorder: Secondary | ICD-10-CM | POA: Diagnosis not present

## 2019-11-10 DIAGNOSIS — R0602 Shortness of breath: Secondary | ICD-10-CM | POA: Diagnosis not present

## 2019-11-10 DIAGNOSIS — L309 Dermatitis, unspecified: Secondary | ICD-10-CM | POA: Diagnosis not present

## 2019-11-10 DIAGNOSIS — E7800 Pure hypercholesterolemia, unspecified: Secondary | ICD-10-CM | POA: Diagnosis not present

## 2019-11-10 DIAGNOSIS — Z0000 Encounter for general adult medical examination without abnormal findings: Secondary | ICD-10-CM | POA: Diagnosis not present

## 2019-11-24 ENCOUNTER — Ambulatory Visit
Admission: RE | Admit: 2019-11-24 | Discharge: 2019-11-24 | Disposition: A | Discharge status: Home / Self Care | Payer: PPO | Source: Ambulatory Visit | Attending: Family Medicine | Admitting: Family Medicine

## 2019-11-24 ENCOUNTER — Other Ambulatory Visit: Payer: Self-pay

## 2019-11-24 DIAGNOSIS — R911 Solitary pulmonary nodule: Secondary | ICD-10-CM

## 2019-11-24 DIAGNOSIS — R918 Other nonspecific abnormal finding of lung field: Secondary | ICD-10-CM | POA: Diagnosis not present

## 2020-03-09 ENCOUNTER — Other Ambulatory Visit: Payer: Self-pay | Admitting: Cardiology

## 2020-03-09 ENCOUNTER — Telehealth: Payer: Self-pay | Admitting: Cardiology

## 2020-03-09 NOTE — Telephone Encounter (Signed)
*  STAT* If patient is at the pharmacy, call can be transferred to refill team.   1. Which medications need to be refilled? (please list name of each medication and dose if known)  nitroGLYCERIN (NITROSTAT) 0.4 MG SL tablet  metoprolol succinate (TOPROL-XL) 100 MG 24 hr tablet  ramipril (ALTACE) 5 MG capsule rosuvastatin (CRESTOR) 20 MG tablet  2. Which pharmacy/location (including street and city if local pharmacy) is medication to be sent to? [id number] - , [place] - [address] AT [place]  3. Do they need a 30 day or 90 day supply? 90 day supply  Patient scheduled an appointment for 04/08/20 at 3:00 PM with Dr. Ellyn Hack.

## 2020-03-11 ENCOUNTER — Other Ambulatory Visit: Payer: Self-pay | Admitting: Cardiology

## 2020-04-08 ENCOUNTER — Encounter: Payer: Self-pay | Admitting: Cardiology

## 2020-04-08 ENCOUNTER — Other Ambulatory Visit: Payer: Self-pay

## 2020-04-08 ENCOUNTER — Ambulatory Visit: Payer: PPO | Admitting: Cardiology

## 2020-04-08 VITALS — BP 120/70 | HR 68 | Ht 73.0 in | Wt 299.0 lb

## 2020-04-08 DIAGNOSIS — I2510 Atherosclerotic heart disease of native coronary artery without angina pectoris: Secondary | ICD-10-CM

## 2020-04-08 DIAGNOSIS — E785 Hyperlipidemia, unspecified: Secondary | ICD-10-CM | POA: Diagnosis not present

## 2020-04-08 DIAGNOSIS — R0683 Snoring: Secondary | ICD-10-CM | POA: Diagnosis not present

## 2020-04-08 DIAGNOSIS — I10 Essential (primary) hypertension: Secondary | ICD-10-CM

## 2020-04-08 DIAGNOSIS — I2119 ST elevation (STEMI) myocardial infarction involving other coronary artery of inferior wall: Secondary | ICD-10-CM | POA: Diagnosis not present

## 2020-04-08 DIAGNOSIS — I739 Peripheral vascular disease, unspecified: Secondary | ICD-10-CM | POA: Diagnosis not present

## 2020-04-08 DIAGNOSIS — R400 Somnolence: Secondary | ICD-10-CM

## 2020-04-08 NOTE — Patient Instructions (Addendum)
Medication Instructions:  No changes *If you need a refill on your cardiac medications before your next appointment, please call your pharmacy*   Lab Work: Not needed    Testing/Procedures: will be schedule at [facility] long sleep center  will schedule a sleep study once insurance authorization- Your physician has recommended that you have a sleep study. This test records several body functions during sleep, including: brain activity, eye movement, oxygen and carbon dioxide blood levels, heart rate and rhythm, breathing rate and rhythm, the flow of air through your mouth and nose, snoring, body muscle movements, and chest and belly movement.     Follow-Up: At [facility], you and your health needs are our priority.  As part of our continuing mission to provide you with exceptional heart care, we have created designated Provider Care Teams.  These Care Teams include your primary Cardiologist (physician) and Advanced Practice Providers (APPs -  Physician Assistants and Nurse Practitioners) who all work together to provide you with the care you need, when you need it.  We recommend signing up for the patient portal called "MyChart".  Sign up information is provided on this After Visit Summary.  MyChart is used to connect with patients for Virtual Visits (Telemedicine).  Patients are able to view lab/test results, encounter notes, upcoming appointments, etc.  Non-urgent messages can be sent to your provider as well.   To learn more about what you can do with MyChart, go to [email or web address].    Your next appointment:   12 month(s)  The format for your next appointment:   In Person  Provider:   Glenetta Hew, MD   Other Instructions n/a

## 2020-04-08 NOTE — Progress Notes (Signed)
Primary Care Provider: [name], MD Cardiologist: Glenetta Hew, MD Electrophysiologist: None  Clinic Note: Chief Complaint  Patient presents with  . Follow-up    Little short of breath, tired in the day  . Coronary Artery Disease    No angina    HPI:    Colin Ryan is a 69 y.o. male with a PMH notable for CAD-CABG followed by who presents today for what amounts to be an 60-month follow-up.[name]   PMH: CAD-CABG for Left Main and RCA disease 2 years following initial MI with PCI to the RCA x 2 stents.[name] He then had PCI to the proximal LAD 2002 prior to his CABG..   He eventually underwent CABG x3 with LIMA to LAD, RIMA to RPDA and left radial to OM in March 2003.    Myoview October 2017: LOW RISK. EF 52%. Inferior hypokinesis. No reversible ischemia. There is a medium sized moderate intensity fixed inferior inferoseptal perfusion defect suggestive of prior infarct. No change from prior  Colin Ryan was last seen on 04/13/2017 -> was doing well.  No major complaints.  In great spirits.  Was trying to adjust his diet and increase exercise, was having a hard time fully losing weight.  He lost about 25 pounds, but started plateauing.  We will try and exercise least 3 to 4 days a week.  Recent Hospitalizations: None  He was seen by Almyra Deforest, PA on August 16, 2018 noticing some increased exertional dyspnea but no chest pain.  2D echo and Lexiscan stress test ordered.  He was then seen by Dr. Quay Burow for evaluation of possible claudication symptoms.  He was noticing left greater than right bilateral leg pain.  Has had a history of bilateral knee replacements.  Doppler studies showed right ABI of 0.72 with an occluded R SFA and normal left ABI.  He also noted daytime somnolence and nocturnal snoring. -> Dr. Gwenlyn Found recommended medical management and increase walking.  Did not suspect this was claudication.  Suspected nonvascular symptoms.  Reviewed  CV studies:    The following  studies were reviewed today: (if available, images/films reviewed: From Epic Chart or Care Everywhere) . 2D Echo -August 27, 2018: Moderate LVH.  Normal EF at 55 to 60%.  GR 1 DD.  Mild MR.  Mildly dilated ascending aorta 43 mm . Myoview-August 29, 2018: EF by calculation was 45% but more in the 55 to 60% range visually.  Medium size moderate severity defect in the basal -apical inferior as well as apical lateral and apical distribution.  Nonreversible/fixed defect.  Consistent with diaphragmatic attenuation or prior infarct.  Wall motion appears to be in agreement with echocardiogram not with previous studies suggesting inferior scar.   Interval History:   Colin Ryan is being seen today for follow-up actually doing fairly well.  He still has little leg pain with walking, but has not noticed it any worse than usual.  He does still get short of breath doing more than usual TAVR level of exertion.  It keeps him from doing a lot of walking, but not day-to-day activities. He is still somewhat fatigued and has a less exercise tolerance before. ->  We had him fill out an Epworth score and was 10-11.  Dr. Gwenlyn Found had recommended polysomnogram, but this never got completed.  CV Review of Symptoms (Summary) Cardiovascular ROS: positive for - dyspnea on exertion, edema, orthopnea and Exercise intolerance, daytime fatigue. negative for - chest pain, irregular heartbeat, palpitations, paroxysmal  nocturnal dyspnea, rapid heart rate, shortness of breath or Syncope/near syncope, TIA/amaurosis fugax  The patient does not have symptoms concerning for COVID-19 infection (fever, chills, cough, or new shortness of breath).  The patient is practicing social distancing & Masking.    REVIEWED OF SYSTEMS   Review of Systems  Constitutional: Positive for malaise/fatigue. Negative for weight loss.  HENT: Negative for nosebleeds.   Respiratory: Negative for cough and shortness of breath.   Gastrointestinal: Negative  for blood in stool and melena.  Genitourinary: Negative for hematuria.  Musculoskeletal: Positive for joint pain. Negative for falls.  Neurological: Positive for weakness (Generalized). Negative for dizziness and focal weakness.  Psychiatric/Behavioral: Negative for memory loss. The patient is not nervous/anxious and does not have insomnia (Daytime somnolence).    I have reviewed and (if needed) personally updated the patient's problem list, medications, allergies, past medical and surgical history, social and family history.   PAST MEDICAL HISTORY   Past Medical History:  Diagnosis Date  . Anxiety   . Arthritis   . Basal cell carcinoma of cheek     left cheek  . CAD S/P percutaneous coronary angioplasty 01/25/2000; 01/2001   INITIAL MI -->PCI to RCA;; 6/'02 Unstable Angina -- PCI-90% pLAD (BMS x 2) 2002; 2003 -->Ostial LM 50%, 90% D1 & AVGCx, 70% ISR in RCA  . Dyslipidemia, goal LDL below 70    On statin. Monitored by PCP  . Exertional dyspnea    Chronic  . GERD (gastroesophageal reflux disease)    Takes Alka-Seltzer PRN  . Hypertension   . Left main coronary artery disease 11/05/01   50+ percent Left Main; 90% D1 and AV groove circumflex, 70% RCA ISR  . Obesity, Class II, BMI 35-39.9, with comorbidity   . Obstructive sleep apnea 09/10/2018  . S/P CABG x 3 11/06/01   LIMA-LAD, RIMA-RPDA, fLRad-OM  . ST elevation myocardial infarction (STEMI) of inferior wall, subsequent episode of care ([place]) 01/25/2000   100% mRCA  BMS 3.0 mm x 30 mm; 50% LAD, 80% D1  . Umbilical hernia     PAST SURGICAL HISTORY   Past Surgical History:  Procedure Laterality Date  . CARDIAC CATHETERIZATION  11/05/2001    70% in stent  restenosis in RCA stent, 50+% distal  left main  diesae with patent LAD stent.   . COLONOSCOPY    . CORONARY ARTERY BYPASS GRAFT  11/06/01   LIMA-LAD, RIMA-RCA, [facility]-OM;   . JOINT REPLACEMENT  02/2011   left knee  . mastoid surgery     right ear as a child  . NM MYOVIEW LTD   11/12/2012   INFEROPICAL SCAR  ,EF 55%  . NM MYOVIEW LTD  08/29/2018   EF by calculation was 45% but more in the 55 to 60% range visually.  Medium size moderate severity defect in the basal -apical inferior as well as apical lateral and apical distribution.  Nonreversible/fixed defect.  Consistent with diaphragmatic attenuation or prior infarct.  Wall motion appears to be in agreement with echocardiogram not with previous studies suggesting inferior scar.  [name] PERCUTANEOUS CORONARY STENT INTERVENTION (PCI-S)  01/25/2000   PCI-RCA occlusion; BMS 3.0 mm x 30 mm  . PERCUTANEOUS CORONARY STENT INTERVENTION (PCI-S)  01/30/2001   Proximal LAD- 2 overlapping Velocity BMS stents 3.0 mm x 18 mm, 3.0 mm x 8 mm   . SKIN CANCER EXCISION    . TONSILLECTOMY    . TOTAL KNEE ARTHROPLASTY  11/06/2011   Procedure: TOTAL KNEE ARTHROPLASTY;  Surgeon: Gearlean Alf, MD;  Location: [facility];  Service: Orthopedics;  Laterality: Right;  . TRANSTHORACIC ECHOCARDIOGRAM  01/04/2011   Mild Anterior Hypokinesis, EF 50-55%, Mildly Dilaed Left Atrium, No Significant Valvular Lesions, Some Mld Aortic Sclerosis  . TRANSTHORACIC ECHOCARDIOGRAM  08/27/2018    Moderate LVH.  Normal EF at 55 to 60%.  GR 1 DD.  Mild MR.  Mildly dilated ascending aorta 43 mm  . UMBILICAL HERNIA REPAIR  08/22/2012   Procedure: HERNIA REPAIR UMBILICAL ADULT;  Surgeon: Luella Cook III, MD;  Location: [facility];  Service: General;  Laterality: N/A;  umbilical hernia repair     MEDICATIONS/ALLERGIES   Current Meds  Medication Sig  . albuterol (PROVENTIL HFA;VENTOLIN HFA) 108 (90 BASE) MCG/ACT inhaler Inhale 2 puffs into the lungs every 4 (four) hours as needed for wheezing or shortness of breath.  [name] aspirin 81 MG tablet Take 81 mg by mouth daily.  . clonazePAM (KLONOPIN) 0.5 MG tablet Take 0.25-0.5 mg by mouth as needed.  . metoprolol succinate (TOPROL-XL) 100 MG 24 hr tablet TAKE 1 TABLET BY MOUTH EVERYDAY AT BEDTIME  . nitroGLYCERIN (NITROSTAT) 0.4 MG SL  tablet PLACE 1 TABLET UNDER TONGUE EVERY 5 (FIVE) MINUTES AS NEEDED FOR CHEST PAIN. PLEASE SCHEDULE APPT  . ramipril (ALTACE) 5 MG capsule TAKE 1 CAPSULE (5 MG TOTAL) BY MOUTH 2 (TWO) TIMES DAILY.  . rosuvastatin (CRESTOR) 20 MG tablet TAKE 1 TABLET BY MOUTH EVERY DAY    Allergies  Allergen Reactions  . Lipitor [Atorvastatin] Other (See Comments)    UNSPECIFIED.   [name] Zocor [Simvastatin] Other (See Comments)    UNSPECIFIED.     SOCIAL HISTORY/FAMILY HISTORY   Reviewed in Epic:  Pertinent findings: No new changes  OBJCTIVE -PE, EKG, labs   Wt Readings from Last 3 Encounters:  04/08/20 299 lb (135.6 kg)  09/10/18 (!) 304 lb (137.9 kg)  08/29/18 (!) 306 lb (138.8 kg)    Physical Exam: BP 120/70   Pulse 68   Ht 6\' 1"  (1.854 m)   Wt 299 lb (135.6 kg)   SpO2 95%   BMI 39.45 kg/m  Physical Exam Constitutional:      General: He is not in acute distress.    Appearance: Normal appearance. He is not ill-appearing or diaphoretic.     Comments: Borderline morbidly obese.  HENT:     Head: Normocephalic and atraumatic.  Neck:     Vascular: Hepatojugular reflux present. No carotid bruit or JVD.  Cardiovascular:     Rate and Rhythm: Normal rate and regular rhythm.     Chest Wall: PMI is not displaced.     Pulses: Intact distal pulses.     Heart sounds: S1 normal and S2 normal. Heart sounds are distant. No murmur heard.  No friction rub. No gallop.   Pulmonary:     Effort: Pulmonary effort is normal. No respiratory distress.     Comments: Diminished breath sounds due to body habitus Abdominal:     General: Abdomen is flat. Bowel sounds are normal.     Palpations: Abdomen is soft.     Comments: Obese  Musculoskeletal:        General: No swelling. Normal range of motion.     Cervical back: Normal range of motion and neck supple.  Skin:    Findings: Erythema present.  Neurological:     General: No focal deficit present.     Mental Status: He is alert and oriented to person,  place,  and time.  Psychiatric:        Mood and Affect: Mood normal.        Behavior: Behavior normal.        Thought Content: Thought content normal.      Adult ECG Report  Rate: 68 ;  Rhythm: normal sinus rhythm and Left axis deviation, inferior MI, age undetermined.  Otherwise normal durations.;   Narrative Interpretation: Stable EKG  Recent Labs:   11/10/2019: TC 133, TG 129, HDL 40, LDL 69. A1c 6.5, Hgb 16.1, CR 0.98, K+ 5.1. No results found for: CHOL, HDL, LDLCALC, LDLDIRECT, TRIG, CHOLHDL Lab Results  Component Value Date   CREATININE 1.06 11/10/2014   BUN 14 11/10/2014   NA 137 11/10/2014   K 4.2 11/10/2014   CL 101 11/10/2014   CO2 23 11/10/2014   No results found for: TSH  ASSESSMENT/PLAN    Problem List Items Addressed This Visit    H/O ST elevation myocardial infarction (STEMI) of inferior wall -- RCA; inferior infarct on Myoview. (Chronic)    Current Myoview was essentially the same as back in October 2017 in March 2014. Mostly fixed inferior defect. Interestingly, the wall motion is relatively stable in that area, indicating not full-thickness scar.  Thankfully, he is on stable medications and has had no further symptoms.      Coronary artery disease involving left main coronary artery - status post CABG x3 - Primary (Chronic)    No active angina. Nonischemic Myoview earlier this year. Wall motion on echo and likely would argue that the defect was potentially artifact, however he does have history of findings of would not be unreasonable to have normal motion abnormality there.  He has not been as active over the COVID-19 timeframe but is still trying to exercise.  Plan: Continue aspirin and statin at current dose. Continue Toprol and metoprolol at current dose (taking 1 pill 5 mg twice daily to avoid excess hypotension on higher dose. On standing aspirin but no Plavix.      Relevant Orders   EKG 12-Lead (Completed)   Essential hypertension (Chronic)     Blood pressure looks excellent today on current dose of Toprol and ramipril. No change.      Relevant Orders   EKG 12-Lead (Completed)   Dyslipidemia, goal LDL below 70 (Chronic)    Lipids look actually pretty well controlled this March. He is stable dose of rosuvastatin at 20 mg. There is definitely improvement in numbers as we increase from 10 to 20 mg. Hopefully with continued weight loss this will continue to improve.        Peripheral arterial disease ([place])    Documented in Dr. Jenness Corner note. We will continue to monitor for worsening claudication symptoms. Otherwise medical management.      Somnolence, daytime    He is still quite fatigued and tired during the daytime. Epworth score was 11.  Talked about appropriate sleep habits.  Will refer for polysomnogram.      Relevant Orders   EKG 12-Lead (Completed)   Split night study    Other Visit Diagnoses    Snoring       Relevant Orders   EKG 12-Lead (Completed)   Split night study       COVID-19 Education: The signs and symptoms of COVID-19 were discussed with the patient and how to seek care for testing (follow up with PCP or arrange E-visit).   The importance of social distancing and COVID-19 vaccination was discussed today.  I spent a total of 65minutes with the patient. >  50% of the time was spent in direct patient consultation.  Additional time spent with chart review  / charting (studies, outside notes, etc): 10 Total Time: 28 min   Current medicines are reviewed at length with the patient today.  (+/- concerns) n/a  Notice: This dictation was prepared with Dragon dictation along with smaller phrase technology. Any transcriptional errors that result from this process are unintentional and may not be corrected upon review.  Patient Instructions / Medication Changes & Studies & Tests Ordered   Patient Instructions  Medication Instructions:  No changes *If you need a refill on your cardiac  medications before your next appointment, please call your pharmacy*   Lab Work: Not needed    Testing/Procedures: will be schedule at [facility] long sleep center  will schedule a sleep study once insurance authorization- Your physician has recommended that you have a sleep study. This test records several body functions during sleep, including: brain activity, eye movement, oxygen and carbon dioxide blood levels, heart rate and rhythm, breathing rate and rhythm, the flow of air through your mouth and nose, snoring, body muscle movements, and chest and belly movement.     Follow-Up: At [facility], you and your health needs are our priority.  As part of our continuing mission to provide you with exceptional heart care, we have created designated Provider Care Teams.  These Care Teams include your primary Cardiologist (physician) and Advanced Practice Providers (APPs -  Physician Assistants and Nurse Practitioners) who all work together to provide you with the care you need, when you need it.  We recommend signing up for the patient portal called "MyChart".  Sign up information is provided on this After Visit Summary.  MyChart is used to connect with patients for Virtual Visits (Telemedicine).  Patients are able to view lab/test results, encounter notes, upcoming appointments, etc.  Non-urgent messages can be sent to your provider as well.   To learn more about what you can do with MyChart, go to [email or web address].    Your next appointment:   12 month(s)  The format for your next appointment:   In Person  Provider:   Glenetta Hew, MD   Other Instructions n/a    Studies Ordered:   Orders Placed This Encounter  Procedures  . EKG 12-Lead  . Split night study     Glenetta Hew, M.D., M.S. Interventional Cardiologist   Pager # [phone] Phone # [phone] [address] [place], [place] [address]   Thank you for choosing Heartcare at [facility]!!

## 2020-04-13 ENCOUNTER — Encounter: Payer: Self-pay | Admitting: Cardiology

## 2020-04-13 DIAGNOSIS — R400 Somnolence: Secondary | ICD-10-CM | POA: Insufficient documentation

## 2020-04-13 NOTE — Assessment & Plan Note (Signed)
Blood pressure looks excellent today on current dose of Toprol and ramipril. No change.

## 2020-04-13 NOTE — Assessment & Plan Note (Signed)
Documented in Dr. Jenness Corner note. We will continue to monitor for worsening claudication symptoms. Otherwise medical management.

## 2020-04-13 NOTE — Assessment & Plan Note (Signed)
Lipids look actually pretty well controlled this March. He is stable dose of rosuvastatin at 20 mg. There is definitely improvement in numbers as we increase from 10 to 20 mg. Hopefully with continued weight loss this will continue to improve.

## 2020-04-13 NOTE — Assessment & Plan Note (Signed)
Current Myoview was essentially the same as back in October 2017 in March 2014. Mostly fixed inferior defect. Interestingly, the wall motion is relatively stable in that area, indicating not full-thickness scar.  Thankfully, he is on stable medications and has had no further symptoms.

## 2020-04-13 NOTE — Assessment & Plan Note (Signed)
No active angina. Nonischemic Myoview earlier this year. Wall motion on echo and likely would argue that the defect was potentially artifact, however he does have history of findings of would not be unreasonable to have normal motion abnormality there.  He has not been as active over the COVID-19 timeframe but is still trying to exercise.  Plan: Continue aspirin and statin at current dose. Continue Toprol and metoprolol at current dose (taking 1 pill 5 mg twice daily to avoid excess hypotension on higher dose. On standing aspirin but no Plavix.

## 2020-04-13 NOTE — Assessment & Plan Note (Signed)
He is still quite fatigued and tired during the daytime. Epworth score was 11.  Talked about appropriate sleep habits.  Will refer for polysomnogram.

## 2020-04-15 ENCOUNTER — Telehealth: Payer: Self-pay | Admitting: *Deleted

## 2020-04-15 NOTE — Telephone Encounter (Signed)
Left sleep study appointment details and [facility] contact information on patient's cell phone VM.

## 2020-05-16 ENCOUNTER — Encounter (HOSPITAL_BASED_OUTPATIENT_CLINIC_OR_DEPARTMENT_OTHER): Payer: PPO | Admitting: Cardiovascular Disease

## 2020-06-02 ENCOUNTER — Other Ambulatory Visit: Payer: Self-pay | Admitting: Cardiology

## 2020-06-02 NOTE — Telephone Encounter (Signed)
Rx request sent to pharmacy.  

## 2020-07-01 DIAGNOSIS — Z23 Encounter for immunization: Secondary | ICD-10-CM | POA: Diagnosis not present

## 2020-08-09 ENCOUNTER — Telehealth: Payer: Self-pay | Admitting: Cardiology

## 2020-08-09 NOTE — Telephone Encounter (Signed)
Spoke to patient's wife  Informed her  Dr Ellyn Hack recommend to follow  CDC recommendation - to take covid booster.   Recommend to talk with Dr Orland Mustard about Vitamin D  Supplement.   If not getting any Sunlight - could possible take Vit D 1,000- 2,000 units a day .  wife voice understanding and states she will contact primary about Vit D.

## 2020-08-09 NOTE — Telephone Encounter (Signed)
New [place]:     Pt wants  To know if Dr Ellyn Hack thinks he should take the Booster Shot for COVID, with his history?

## 2020-08-09 NOTE — Telephone Encounter (Signed)
Spoke with Colin Ryan in regards to Colin Ryan's shot. Colin Ryan would like to know if Dr. Ellyn Hack recommends a covid booster shot. Colin Ryan states that he received his last [place] Covid vaccine in 3/21. Colin Ryan would like to know if he should get the [place] or [place] booster and if its safe or reccommended to mix and match. Colin Ryan would also like to know what the risks of Myocarditis is with getting the booster. Colin Ryan would also like to know if Dr. Ellyn Hack recommends Vitamin D with patients current medication regimen and if so what dosage. Advised Colin Ryan that I would forward patients questions to Dr. Ellyn Hack to review and advise. Colin Ryan verbalized understanding.

## 2020-09-16 ENCOUNTER — Other Ambulatory Visit: Payer: Self-pay | Admitting: Cardiology

## 2020-12-07 DIAGNOSIS — Z0000 Encounter for general adult medical examination without abnormal findings: Secondary | ICD-10-CM | POA: Diagnosis not present

## 2020-12-07 DIAGNOSIS — I700 Atherosclerosis of aorta: Secondary | ICD-10-CM | POA: Diagnosis not present

## 2020-12-07 DIAGNOSIS — F411 Generalized anxiety disorder: Secondary | ICD-10-CM | POA: Diagnosis not present

## 2020-12-07 DIAGNOSIS — E1169 Type 2 diabetes mellitus with other specified complication: Secondary | ICD-10-CM | POA: Diagnosis not present

## 2020-12-07 DIAGNOSIS — I2510 Atherosclerotic heart disease of native coronary artery without angina pectoris: Secondary | ICD-10-CM | POA: Diagnosis not present

## 2020-12-07 DIAGNOSIS — R7309 Other abnormal glucose: Secondary | ICD-10-CM | POA: Diagnosis not present

## 2020-12-07 DIAGNOSIS — E7800 Pure hypercholesterolemia, unspecified: Secondary | ICD-10-CM | POA: Diagnosis not present

## 2020-12-07 DIAGNOSIS — R351 Nocturia: Secondary | ICD-10-CM | POA: Diagnosis not present

## 2020-12-07 DIAGNOSIS — L309 Dermatitis, unspecified: Secondary | ICD-10-CM | POA: Diagnosis not present

## 2020-12-15 ENCOUNTER — Other Ambulatory Visit: Payer: Self-pay | Admitting: Cardiology

## 2020-12-24 DIAGNOSIS — Z9089 Acquired absence of other organs: Secondary | ICD-10-CM | POA: Diagnosis not present

## 2020-12-24 DIAGNOSIS — H6062 Unspecified chronic otitis externa, left ear: Secondary | ICD-10-CM | POA: Diagnosis not present

## 2020-12-24 DIAGNOSIS — Z9889 Other specified postprocedural states: Secondary | ICD-10-CM | POA: Diagnosis not present

## 2020-12-24 DIAGNOSIS — H906 Mixed conductive and sensorineural hearing loss, bilateral: Secondary | ICD-10-CM | POA: Diagnosis not present

## 2021-03-13 ENCOUNTER — Other Ambulatory Visit: Payer: Self-pay | Admitting: Cardiology

## 2021-03-16 ENCOUNTER — Other Ambulatory Visit: Payer: Self-pay | Admitting: Cardiology

## 2021-04-21 ENCOUNTER — Other Ambulatory Visit: Payer: Self-pay | Admitting: Family Medicine

## 2021-04-21 ENCOUNTER — Ambulatory Visit
Admission: RE | Admit: 2021-04-21 | Discharge: 2021-04-21 | Disposition: A | Discharge status: Home / Self Care | Payer: PPO | Source: Ambulatory Visit | Attending: Family Medicine | Admitting: Family Medicine

## 2021-04-21 DIAGNOSIS — N50812 Left testicular pain: Secondary | ICD-10-CM

## 2021-04-21 DIAGNOSIS — N433 Hydrocele, unspecified: Secondary | ICD-10-CM | POA: Diagnosis not present

## 2021-04-21 DIAGNOSIS — N503 Cyst of epididymis: Secondary | ICD-10-CM | POA: Diagnosis not present

## 2021-06-01 DIAGNOSIS — D2930 Benign neoplasm of unspecified epididymis: Secondary | ICD-10-CM | POA: Diagnosis not present

## 2021-06-01 DIAGNOSIS — N451 Epididymitis: Secondary | ICD-10-CM | POA: Diagnosis not present

## 2021-06-01 DIAGNOSIS — R351 Nocturia: Secondary | ICD-10-CM | POA: Diagnosis not present

## 2021-06-12 ENCOUNTER — Other Ambulatory Visit: Payer: Self-pay | Admitting: Cardiology

## 2021-06-24 DIAGNOSIS — Z23 Encounter for immunization: Secondary | ICD-10-CM | POA: Diagnosis not present

## 2021-07-15 ENCOUNTER — Other Ambulatory Visit: Payer: Self-pay | Admitting: Cardiology

## 2021-08-04 ENCOUNTER — Other Ambulatory Visit: Payer: Self-pay | Admitting: Cardiology

## 2021-08-28 ENCOUNTER — Other Ambulatory Visit: Payer: Self-pay | Admitting: Cardiology

## 2021-09-01 DIAGNOSIS — F411 Generalized anxiety disorder: Secondary | ICD-10-CM | POA: Diagnosis not present

## 2021-09-01 DIAGNOSIS — U071 COVID-19: Secondary | ICD-10-CM | POA: Diagnosis not present

## 2021-11-21 ENCOUNTER — Ambulatory Visit (INDEPENDENT_AMBULATORY_CARE_PROVIDER_SITE_OTHER): Payer: PPO | Admitting: Cardiology

## 2021-11-21 ENCOUNTER — Encounter: Payer: Self-pay | Admitting: Cardiology

## 2021-11-21 VITALS — BP 114/64 | HR 56 | Ht 73.0 in | Wt 292.0 lb

## 2021-11-21 DIAGNOSIS — G4733 Obstructive sleep apnea (adult) (pediatric): Secondary | ICD-10-CM | POA: Diagnosis not present

## 2021-11-21 DIAGNOSIS — I2119 ST elevation (STEMI) myocardial infarction involving other coronary artery of inferior wall: Secondary | ICD-10-CM | POA: Diagnosis not present

## 2021-11-21 DIAGNOSIS — I739 Peripheral vascular disease, unspecified: Secondary | ICD-10-CM

## 2021-11-21 DIAGNOSIS — I2510 Atherosclerotic heart disease of native coronary artery without angina pectoris: Secondary | ICD-10-CM

## 2021-11-21 DIAGNOSIS — E785 Hyperlipidemia, unspecified: Secondary | ICD-10-CM

## 2021-11-21 DIAGNOSIS — I10 Essential (primary) hypertension: Secondary | ICD-10-CM

## 2021-11-21 MED ORDER — NITROGLYCERIN 0.4 MG SL SUBL: SUBLINGUAL_TABLET | SUBLINGUAL | 4 refills | Status: DC

## 2021-11-21 MED ORDER — ROSUVASTATIN CALCIUM 20 MG PO TABS: 20.0000 mg | ORAL_TABLET | Freq: Every day | ORAL | 3 refills | Status: DC

## 2021-11-21 MED ORDER — METOPROLOL SUCCINATE ER 100 MG PO TB24: ORAL_TABLET | ORAL | 3 refills | Status: DC

## 2021-11-21 MED ORDER — RAMIPRIL 5 MG PO CAPS: 5.0000 mg | ORAL_CAPSULE | Freq: Two times a day (BID) | ORAL | 3 refills | Status: DC

## 2021-11-21 NOTE — Patient Instructions (Addendum)

## 2021-11-21 NOTE — Progress Notes (Signed)
? ? ?Primary Care Provider: [name], MD ?Cardiologist: Glenetta Hew, MD ?Electrophysiologist: None ? ?Clinic Note: ?Chief Complaint  ?Patient presents with  ? Follow-up  ?  Delayed-almost 1 and half years.  Needs med refills.  Thankfully, doing well.  Losing weight, better exercise and diet.  ? Coronary Artery Disease  ?  No angina or heart failure.  ? ? ?=================================== ? ?ASSESSMENT/PLAN  ? ?Problem List Items Addressed This Visit   ? ?  ? Cardiology Problems  ? H/O ST elevation myocardial infarction (STEMI) of inferior wall -- RCA; inferior infarct on Myoview. (Chronic)  ?  24 years out from infarction.  He has had several Myoview was read as having inferior fixed defect with most recent 1 suggesting that there was wall motion in that area and that this was probably related to Dr. Gwenlyn Found attenuation.  However there also is probably some component of infarct just not full-thickness. ?.. Thankfully, despite having a mildly reduced EF he is not having any CHF symptoms.  No further angina. ? ?  ?  ? Relevant Medications  ? metoprolol succinate (TOPROL-XL) 100 MG 24 hr tablet  ? rosuvastatin (CRESTOR) 20 MG tablet  ? ramipril (ALTACE) 5 MG capsule  ? nitroGLYCERIN (NITROSTAT) 0.4 MG SL tablet  ? Coronary artery disease involving left main coronary artery - status post CABG x3 - Primary (Chronic)  ?  CABG or ostial left main disease and questionable RCA disease basically 20 years ago.[name]  He seems to be doing pretty well and has not had a ischemic Myoview since CABG.  Dose back in 2003.  Most recent Myoview was in 2020.  Will be due for follow-up in 2025 unless symptoms warrant. ? ?Plan:  ?Continue beta-blocker, ACE inhibitor along with statin and aspirin. ?PRN nitroglycerin, not used. ?Plan follow-up Myoview in 2025. ?  ?  ? Relevant Medications  ? metoprolol succinate (TOPROL-XL) 100 MG 24 hr tablet  ? rosuvastatin (CRESTOR) 20 MG tablet  ? ramipril (ALTACE) 5 MG capsule  ? nitroGLYCERIN  (NITROSTAT) 0.4 MG SL tablet  ? Other Relevant Orders  ? EKG 12-Lead (Completed)  ? Essential hypertension (Chronic)  ?  Blood pressure looks good today on current dose of ramipril and Toprol. ? ?Due for refill. ? ?  ?  ? Relevant Medications  ? metoprolol succinate (TOPROL-XL) 100 MG 24 hr tablet  ? rosuvastatin (CRESTOR) 20 MG tablet  ? ramipril (ALTACE) 5 MG capsule  ? nitroGLYCERIN (NITROSTAT) 0.4 MG SL tablet  ? Other Relevant Orders  ? EKG 12-Lead (Completed)  ? Dyslipidemia, goal LDL below 70 (Chronic)  ?  Last set of labs from April 2022 showed LDL 58.  Pretty much near target of less than 55 with intermittent goal less than 70 achieved. ? ?Continue rosuvastatin 20 mg daily.  Closely.  Low threshold to titrate up further.  We will increase him to 20 mg and showed improvement. ? ?Due for follow-up labs with PCP in April May timeframe.. ? ?  ?  ? Relevant Medications  ? metoprolol succinate (TOPROL-XL) 100 MG 24 hr tablet  ? rosuvastatin (CRESTOR) 20 MG tablet  ? ramipril (ALTACE) 5 MG capsule  ? nitroGLYCERIN (NITROSTAT) 0.4 MG SL tablet  ? Other Relevant Orders  ? EKG 12-Lead (Completed)  ? Peripheral arterial disease (HCC) (Chronic)  ?  Right SFA occlusion.  No significant claudication symptoms.  Continue medical management.  Continue walking is a primary source of exercise as well as biking.  Monitor. ? ?  Continue blood pressure and lipid management.  Monitor glycemic control. ? ?  ?  ? Relevant Medications  ? metoprolol succinate (TOPROL-XL) 100 MG 24 hr tablet  ? rosuvastatin (CRESTOR) 20 MG tablet  ? ramipril (ALTACE) 5 MG capsule  ? nitroGLYCERIN (NITROSTAT) 0.4 MG SL tablet  ?  ? Other  ? Morbid obesity ([place]) (Chronic)  ?  Back in 2018 he was 316 pounds, by January 2021 I last saw his weight was 304.  Now down to 292.  He is now trying to make a concerted effort to lose weight with dietary modification and increased exercise.  I congratulated his efforts and encouraged him to continue to work on  it. ? ?The more weight he loses, the better he feels.  This allows him to do more exercise. ? ?  ?  ? Obstructive sleep apnea (Chronic)  ?  I do not see that he has had a polysomnogram.  Reassess at next visit. ? ?  ?  ? ?Medications refilled.  Doing well.  Need to reassess claudication and possible OSA at next visit. ?Next follow-up Myoview will be due in 2025 ? ?=================================== ? ?HPI:   ? ?Colin Ryan is a morbidly obese  71 y.o. male with a PMH notable for CAD-PCI (2001)-> CABG x3 (2003-LIMA-LAD, RIMA-OM, FrRad-OM). HTn, HLD & PAD (RFSA 100%), who presents today for ~ 18 month f/u. ? ?PMH:  ?CAD-IMI 01/2000-BMS PCI RCA x2 ?01/2001: PCI proximal LAD ?10/2001: Cardiac catheter abnormal Myoview with inferior ischemia => 50% ostial left main, AVG LCx 90% ostial 70% distal RCA.  EF 45 to 50%. ?CABG for Left Main and RCA disease: LIMA to LAD, RIMA to RPDA, L Rad-OM ?Myoview January 2020: EF by computer 45%, mid LAD 55 to 60%.  Medium size moderate severity fixed defect in the basal to apical inferior, apical lateral and apical location consistent with prior infarct.  (Read as possible diaphragmatic attenuation-with normal wall motion.).  LOW RISK. ?TTE January 2020: Moderate LVH.  EF 55 to 60%.  No RWMA.  GR 1 DD.  Mild AI with aortic calcification. ? ?Colin Ryan was last seen on April 08, 2020.  In addition to having been seen by Almyra Deforest, PA for exertional dyspnea (evaluated with echo and Myoview as noted above), he saw Dr. Gwenlyn Found for possible claudication-R ABI 0.72 with occluded R SFA with normal L ABI.  Medical management.  Recommended walking.  Also recommended polysomnogram that was not ordered. ?=> Was doing well.  A little bit of pain when walking, but no worse than usual.  A little bit of dyspnea with more than normal level of exertion.  Therefore he does not walk a lot.  Nothing to limit day-to-day activities.  Some exercise intolerance and fatigue. ? ?Recent Hospitalizations:  None ? ?Reviewed  CV studies:   ? ?The following studies were reviewed today: (if available, images/films reviewed: From Epic Chart or Care Everywhere) ?None: ? ?Interval History:  ? ?Colin Ryan returns here today for delayed follow-up. ?He is doing fairly well.  He is trying to get back into doing some walking exercises.  Walking bothers his joints probably from the weight.  He is trying to ride his bike more.  He has also made a concerted effort to adjust his diet.  He is losing weight now [employer].  Keep losing weight.  He feels much better overall and his energy level has improved although he is sometimes tired in the day. ?He is making  a effort to cut down his caffeine foods and Carbohydrates.  Morpheus, examined and taken with less fried foods.  He does say that overall he is overall health is better.  He does get some indigestion with some of the different foods as he is getting used to it. ? ?He really is not noticing any claudication with walking, nor is he noticing any chest pain or pressure with rest or exertion.  No angina or heart failure symptoms.  No arrhythmia symptoms.  Still has a little exercise tolerance but definitely improving as he loses weight and exercise more. ? ?CV Review of Symptoms (Summary) ?Cardiovascular ROS: positive for - dyspnea on exertion, orthopnea, and These are both improved.  Also has some daytime somnolence/fatigue and exercise intolerance that is improving with weight loss and exercise. ?negative for - chest pain, irregular heartbeat, palpitations, paroxysmal nocturnal dyspnea, rapid heart rate, shortness of breath, or Syncope/near syncope or TIA/RCS complication ? ?REVIEWED OF SYSTEMS  ? ?Review of Systems  ?Constitutional:  Positive for weight loss (He is lost about 12 pounds in the last few months.  Eating better and increasing exercise.). Negative for malaise/fatigue.  ?HENT:  Negative for congestion and nosebleeds.   ?Respiratory:  Negative for cough, sputum  production and wheezing.   ?     Daytime somnolence  ?Cardiovascular:   ?     Per HPI  ?Gastrointestinal:  Positive for heartburn. Negative for blood in stool and melena.  ?Genitourinary:  Negative for hematuria.  ?Musculoskeleta

## 2021-12-25 ENCOUNTER — Encounter: Payer: Self-pay | Admitting: Cardiology

## 2021-12-25 NOTE — Assessment & Plan Note (Signed)
24 years out from infarction.  He has had several Myoview was read as having inferior fixed defect with most recent 1 suggesting that there was wall motion in that area and that this was probably related to Dr. Gwenlyn Found attenuation.  However there also is probably some component of infarct just not full-thickness. ?.. Thankfully, despite having a mildly reduced EF he is not having any CHF symptoms.  No further angina. ?

## 2021-12-25 NOTE — Assessment & Plan Note (Signed)
Back in 2018 he was 316 pounds, by January 2021 I last saw his weight was 304.  Now down to 292.  He is now trying to make a concerted effort to lose weight with dietary modification and increased exercise.  I congratulated his efforts and encouraged him to continue to work on it. ? ?The more weight he loses, the better he feels.  This allows him to do more exercise. ?

## 2021-12-25 NOTE — Assessment & Plan Note (Signed)
I do not see that he has had a polysomnogram.  Reassess at next visit. ?

## 2021-12-25 NOTE — Assessment & Plan Note (Signed)
Blood pressure looks good today on current dose of ramipril and Toprol. ? ?Due for refill. ?

## 2021-12-25 NOTE — Assessment & Plan Note (Signed)
CABG or ostial left main disease and questionable RCA disease basically 20 years ago.[name]  He seems to be doing pretty well and has not had a ischemic Myoview since CABG.  Dose back in 2003.  Most recent Myoview was in 2020.  Will be due for follow-up in 2025 unless symptoms warrant. ? ?Plan:  ?? Continue beta-blocker, ACE inhibitor along with statin and aspirin. ?? PRN nitroglycerin, not used. ?? Plan follow-up Myoview in 2025. ?

## 2021-12-25 NOTE — Assessment & Plan Note (Signed)
Last set of labs from April 2022 showed LDL 58.  Pretty much near target of less than 55 with intermittent goal less than 70 achieved. ? ?Continue rosuvastatin 20 mg daily.  Closely.  Low threshold to titrate up further.  We will increase him to 20 mg and showed improvement. ? ?Due for follow-up labs with PCP in April May timeframe.[name] ?

## 2021-12-25 NOTE — Assessment & Plan Note (Signed)
Right SFA occlusion.  No significant claudication symptoms.  Continue medical management.  Continue walking is a primary source of exercise as well as biking.  Monitor. ? ?Continue blood pressure and lipid management.  Monitor glycemic control. ?

## 2022-01-02 DIAGNOSIS — J988 Other specified respiratory disorders: Secondary | ICD-10-CM | POA: Diagnosis not present

## 2022-01-02 DIAGNOSIS — R062 Wheezing: Secondary | ICD-10-CM | POA: Diagnosis not present

## 2022-01-02 DIAGNOSIS — Z03818 Encounter for observation for suspected exposure to other biological agents ruled out: Secondary | ICD-10-CM | POA: Diagnosis not present

## 2022-01-03 ENCOUNTER — Other Ambulatory Visit: Payer: Self-pay | Admitting: Family Medicine

## 2022-01-03 ENCOUNTER — Ambulatory Visit
Admission: RE | Admit: 2022-01-03 | Discharge: 2022-01-03 | Disposition: A | Discharge status: Home / Self Care | Payer: PPO | Source: Ambulatory Visit | Attending: Family Medicine | Admitting: Family Medicine

## 2022-01-03 DIAGNOSIS — J029 Acute pharyngitis, unspecified: Secondary | ICD-10-CM | POA: Diagnosis not present

## 2022-01-03 DIAGNOSIS — R059 Cough, unspecified: Secondary | ICD-10-CM

## 2022-01-04 DIAGNOSIS — L309 Dermatitis, unspecified: Secondary | ICD-10-CM | POA: Diagnosis not present

## 2022-01-04 DIAGNOSIS — Z0000 Encounter for general adult medical examination without abnormal findings: Secondary | ICD-10-CM | POA: Diagnosis not present

## 2022-01-04 DIAGNOSIS — I2510 Atherosclerotic heart disease of native coronary artery without angina pectoris: Secondary | ICD-10-CM | POA: Diagnosis not present

## 2022-01-04 DIAGNOSIS — E7800 Pure hypercholesterolemia, unspecified: Secondary | ICD-10-CM | POA: Diagnosis not present

## 2022-01-04 DIAGNOSIS — F411 Generalized anxiety disorder: Secondary | ICD-10-CM | POA: Diagnosis not present

## 2022-01-04 DIAGNOSIS — E1169 Type 2 diabetes mellitus with other specified complication: Secondary | ICD-10-CM | POA: Diagnosis not present

## 2022-01-04 DIAGNOSIS — I700 Atherosclerosis of aorta: Secondary | ICD-10-CM | POA: Diagnosis not present

## 2022-01-04 DIAGNOSIS — Z1211 Encounter for screening for malignant neoplasm of colon: Secondary | ICD-10-CM | POA: Diagnosis not present

## 2022-01-11 DIAGNOSIS — N289 Disorder of kidney and ureter, unspecified: Secondary | ICD-10-CM | POA: Diagnosis not present

## 2022-03-10 DIAGNOSIS — H5213 Myopia, bilateral: Secondary | ICD-10-CM | POA: Diagnosis not present

## 2022-03-10 DIAGNOSIS — H25813 Combined forms of age-related cataract, bilateral: Secondary | ICD-10-CM | POA: Diagnosis not present

## 2022-03-10 DIAGNOSIS — H31002 Unspecified chorioretinal scars, left eye: Secondary | ICD-10-CM | POA: Diagnosis not present

## 2022-03-10 DIAGNOSIS — H43811 Vitreous degeneration, right eye: Secondary | ICD-10-CM | POA: Diagnosis not present

## 2022-05-22 DIAGNOSIS — Z03818 Encounter for observation for suspected exposure to other biological agents ruled out: Secondary | ICD-10-CM | POA: Diagnosis not present

## 2022-05-22 DIAGNOSIS — J988 Other specified respiratory disorders: Secondary | ICD-10-CM | POA: Diagnosis not present

## 2022-05-22 DIAGNOSIS — J029 Acute pharyngitis, unspecified: Secondary | ICD-10-CM | POA: Diagnosis not present

## 2022-06-23 DIAGNOSIS — Z23 Encounter for immunization: Secondary | ICD-10-CM | POA: Diagnosis not present

## 2022-07-12 DIAGNOSIS — Z03818 Encounter for observation for suspected exposure to other biological agents ruled out: Secondary | ICD-10-CM | POA: Diagnosis not present

## 2022-07-12 DIAGNOSIS — R0981 Nasal congestion: Secondary | ICD-10-CM | POA: Diagnosis not present

## 2022-07-12 DIAGNOSIS — R52 Pain, unspecified: Secondary | ICD-10-CM | POA: Diagnosis not present

## 2022-07-12 DIAGNOSIS — R5383 Other fatigue: Secondary | ICD-10-CM | POA: Diagnosis not present

## 2022-07-12 DIAGNOSIS — J029 Acute pharyngitis, unspecified: Secondary | ICD-10-CM | POA: Diagnosis not present

## 2022-07-12 DIAGNOSIS — R051 Acute cough: Secondary | ICD-10-CM | POA: Diagnosis not present

## 2022-11-27 ENCOUNTER — Other Ambulatory Visit: Payer: Self-pay | Admitting: Cardiology

## 2023-01-06 ENCOUNTER — Other Ambulatory Visit: Payer: Self-pay | Admitting: Cardiology

## 2023-01-19 ENCOUNTER — Other Ambulatory Visit: Payer: Self-pay | Admitting: Cardiology

## 2023-01-19 DIAGNOSIS — M25512 Pain in left shoulder: Secondary | ICD-10-CM | POA: Diagnosis not present

## 2023-01-19 DIAGNOSIS — I700 Atherosclerosis of aorta: Secondary | ICD-10-CM | POA: Diagnosis not present

## 2023-01-19 DIAGNOSIS — E119 Type 2 diabetes mellitus without complications: Secondary | ICD-10-CM | POA: Diagnosis not present

## 2023-01-19 DIAGNOSIS — E1169 Type 2 diabetes mellitus with other specified complication: Secondary | ICD-10-CM | POA: Diagnosis not present

## 2023-01-19 DIAGNOSIS — I739 Peripheral vascular disease, unspecified: Secondary | ICD-10-CM | POA: Diagnosis not present

## 2023-01-19 DIAGNOSIS — F411 Generalized anxiety disorder: Secondary | ICD-10-CM | POA: Diagnosis not present

## 2023-01-19 DIAGNOSIS — I2510 Atherosclerotic heart disease of native coronary artery without angina pectoris: Secondary | ICD-10-CM | POA: Diagnosis not present

## 2023-01-19 DIAGNOSIS — Z1211 Encounter for screening for malignant neoplasm of colon: Secondary | ICD-10-CM | POA: Diagnosis not present

## 2023-01-19 DIAGNOSIS — Z0000 Encounter for general adult medical examination without abnormal findings: Secondary | ICD-10-CM | POA: Diagnosis not present

## 2023-01-19 DIAGNOSIS — R351 Nocturia: Secondary | ICD-10-CM | POA: Diagnosis not present

## 2023-01-19 DIAGNOSIS — E7800 Pure hypercholesterolemia, unspecified: Secondary | ICD-10-CM | POA: Diagnosis not present

## 2023-01-19 LAB — PSA: PSA, Total: 1.86

## 2023-01-19 LAB — MICROALBUMIN / CREATININE URINE RATIO
Creatinine, POC: 154 mg/dL
Microalb Creat Ratio: 113.6
Microalbumin, Urine: 17.46

## 2023-01-19 LAB — COMPREHENSIVE METABOLIC PANEL: EGFR: 62

## 2023-01-19 LAB — HEMOGLOBIN A1C W EST AVG/MEAN GLUC: A1c: 6.5

## 2023-01-31 ENCOUNTER — Other Ambulatory Visit: Payer: Self-pay | Admitting: Cardiology

## 2023-02-20 DIAGNOSIS — M19012 Primary osteoarthritis, left shoulder: Secondary | ICD-10-CM | POA: Diagnosis not present

## 2023-02-20 DIAGNOSIS — M25511 Pain in right shoulder: Secondary | ICD-10-CM | POA: Diagnosis not present

## 2023-03-03 ENCOUNTER — Other Ambulatory Visit: Payer: Self-pay | Admitting: Cardiology

## 2023-03-04 ENCOUNTER — Other Ambulatory Visit: Payer: Self-pay | Admitting: Cardiology

## 2023-03-10 NOTE — Progress Notes (Signed)
Labs from 01/19/2023:  CBC: W 8.1, H/H15.1/46.0, PLT 217 TC 107, TG 93, HDL 38, LDL 51; A1c 6.5; TSH 2.25  Na+ 138, K+ 5.1, Cl- 105, HCO3-24, BUN 26, Cr 1.23, Glu 149, Ca2+ 9.1; AST 13, ALT 15, AlkP 108  Bryan Lemma, MD

## 2023-03-13 ENCOUNTER — Other Ambulatory Visit: Payer: Self-pay | Admitting: Cardiology

## 2023-03-28 ENCOUNTER — Other Ambulatory Visit: Payer: Self-pay | Admitting: Cardiology

## 2023-04-04 ENCOUNTER — Other Ambulatory Visit: Payer: Self-pay | Admitting: Cardiology

## 2023-04-13 ENCOUNTER — Other Ambulatory Visit: Payer: Self-pay | Admitting: Cardiology

## 2023-05-08 ENCOUNTER — Other Ambulatory Visit: Payer: Self-pay | Admitting: Cardiology

## 2023-05-14 DIAGNOSIS — H9190 Unspecified hearing loss, unspecified ear: Secondary | ICD-10-CM | POA: Diagnosis not present

## 2023-05-14 DIAGNOSIS — H938X9 Other specified disorders of ear, unspecified ear: Secondary | ICD-10-CM | POA: Diagnosis not present

## 2023-05-14 DIAGNOSIS — H6122 Impacted cerumen, left ear: Secondary | ICD-10-CM | POA: Diagnosis not present

## 2023-05-15 DIAGNOSIS — H95191 Other disorders following mastoidectomy, right ear: Secondary | ICD-10-CM | POA: Diagnosis not present

## 2023-05-15 DIAGNOSIS — H6123 Impacted cerumen, bilateral: Secondary | ICD-10-CM | POA: Diagnosis not present

## 2023-05-15 DIAGNOSIS — H906 Mixed conductive and sensorineural hearing loss, bilateral: Secondary | ICD-10-CM | POA: Diagnosis not present

## 2023-05-26 ENCOUNTER — Other Ambulatory Visit: Payer: Self-pay | Admitting: Cardiology

## 2023-06-06 ENCOUNTER — Other Ambulatory Visit: Payer: Self-pay | Admitting: Cardiology

## 2023-06-15 DIAGNOSIS — R051 Acute cough: Secondary | ICD-10-CM | POA: Diagnosis not present

## 2023-06-15 DIAGNOSIS — R52 Pain, unspecified: Secondary | ICD-10-CM | POA: Diagnosis not present

## 2023-06-15 DIAGNOSIS — U071 COVID-19: Secondary | ICD-10-CM | POA: Diagnosis not present

## 2023-06-15 DIAGNOSIS — R5383 Other fatigue: Secondary | ICD-10-CM | POA: Diagnosis not present

## 2023-07-05 ENCOUNTER — Ambulatory Visit (INDEPENDENT_AMBULATORY_CARE_PROVIDER_SITE_OTHER): Payer: PPO | Admitting: Otolaryngology

## 2023-07-05 ENCOUNTER — Encounter (INDEPENDENT_AMBULATORY_CARE_PROVIDER_SITE_OTHER): Payer: Self-pay | Admitting: Otolaryngology

## 2023-07-05 VITALS — BP 118/73 | HR 71 | Ht 73.0 in | Wt 282.0 lb

## 2023-07-05 DIAGNOSIS — H903 Sensorineural hearing loss, bilateral: Secondary | ICD-10-CM

## 2023-07-05 DIAGNOSIS — Z9889 Other specified postprocedural states: Secondary | ICD-10-CM

## 2023-07-05 DIAGNOSIS — H9212 Otorrhea, left ear: Secondary | ICD-10-CM | POA: Diagnosis not present

## 2023-07-05 DIAGNOSIS — H9193 Unspecified hearing loss, bilateral: Secondary | ICD-10-CM

## 2023-07-05 NOTE — Progress Notes (Signed)
ENT CONSULT:  Reason for Consult: decreased hearing and R cholesteatoma surgery   HPI: Discussed the use of AI scribe software for clinical note transcription with the patient, who gave verbal consent to proceed.   The patient, with a history of chronic ear problems since childhood, primarily in the right ear, underwent right mastoid surgery around the age of 10-12 years. The surgery, performed by Dr. Floreen Comber, successfully stopped the chronic ear drainage. The patient then transitioned to the care of Dr. Lovey Newcomer. However, due to Dr. Jodi Marble increasing teaching commitments and the distance to his practice, the patient sought a closer ENT specialist.  The patient reported a hearing test conducted by Dr. Lovey Newcomer in 2022, which suggested the need for hearing aids. The patient has noticed a progressive decline in hearing, with increased TV volume and missed words in conversations. They have not yet pursued hearing aids but are now considering this option.  Recently, the patient has experienced nocturnal drainage in the left ear, which was historically the healthier ear. The drainage is not excessive but leaves a crusty residue and causes itching. There is no associated pain. The patient denies any significant nasal congestion or postnasal drainage.  The patient had their ears cleaned by a PA approximately two months ago due to significant earwax build-up.       Past Medical History:  Diagnosis Date   Anxiety    Arthritis    Basal cell carcinoma of cheek     left cheek   CAD S/P percutaneous coronary angioplasty 01/25/2000; 01/2001   INITIAL MI -->PCI to RCA;; 6/'02 Unstable Angina -- PCI-90% pLAD (BMS x 2) 2002; 2003 -->Ostial LM 50%, 90% D1 & AVGCx, 70% ISR in RCA   Dyslipidemia, goal LDL below 70    On statin. Monitored by PCP   Exertional dyspnea    Chronic   GERD (gastroesophageal reflux disease)    Takes Alka-Seltzer PRN   Hypertension    Left main coronary artery disease 11/05/01   50+  percent Left Main; 90% D1 and AV groove circumflex, 70% RCA ISR   Obesity, Class II, BMI 35-39.9, with comorbidity    Obstructive sleep apnea 09/10/2018   S/P CABG x 3 11/06/01   LIMA-LAD, RIMA-RPDA, fLRad-OM   ST elevation myocardial infarction (STEMI) of inferior wall, subsequent episode of care (HCC) 01/25/2000   100% mRCA  BMS 3.0 mm x 30 mm; 50% LAD, 80% D1   Umbilical hernia     Past Surgical History:  Procedure Laterality Date   CARDIAC CATHETERIZATION  11/05/2001    70% in stent  restenosis in RCA stent, 50+% distal  left main  diesae with patent LAD stent.    COLONOSCOPY     CORONARY ARTERY BYPASS GRAFT  11/06/01   LIMA-LAD, RIMA-RCA, [facility]-OM;    JOINT REPLACEMENT  02/2011   left knee   mastoid surgery     right ear as a child   NM MYOVIEW LTD  11/12/2012   INFEROPICAL SCAR  ,EF 55%   NM MYOVIEW LTD  08/29/2018   EF by calculation was 45% but more in the 55 to 60% range visually.  Medium size moderate severity defect in the basal -apical inferior as well as apical lateral and apical distribution.  Nonreversible/fixed defect.  Consistent with diaphragmatic attenuation or prior infarct.  Wall motion appears to be in agreement with echocardiogram not with previous studies suggesting inferior scar.   PERCUTANEOUS CORONARY STENT INTERVENTION (PCI-S)  01/25/2000   PCI-RCA occlusion; BMS 3.0  mm x 30 mm   PERCUTANEOUS CORONARY STENT INTERVENTION (PCI-S)  01/30/2001   Proximal LAD- 2 overlapping Velocity BMS stents 3.0 mm x 18 mm, 3.0 mm x 8 mm    SKIN CANCER EXCISION     TONSILLECTOMY     TOTAL KNEE ARTHROPLASTY  11/06/2011   Procedure: TOTAL KNEE ARTHROPLASTY;  Surgeon: Loanne Drilling, MD;  Location: [facility];  Service: Orthopedics;  Laterality: Right;   TRANSTHORACIC ECHOCARDIOGRAM  01/04/2011   Mild Anterior Hypokinesis, EF 50-55%, Mildly Dilaed Left Atrium, No Significant Valvular Lesions, Some Mld Aortic Sclerosis   TRANSTHORACIC ECHOCARDIOGRAM  08/27/2018    Moderate LVH.  Normal  EF at 55 to 60%.  GR 1 DD.  Mild MR.  Mildly dilated ascending aorta 43 mm   UMBILICAL HERNIA REPAIR  08/22/2012   Procedure: HERNIA REPAIR UMBILICAL ADULT;  Surgeon: Robyne Askew, MD;  Location: [facility];  Service: General;  Laterality: N/A;  umbilical hernia repair     Family History  Problem Relation Age of Onset   Cancer Mother        liver   Alzheimer's disease Father    Suicidality Brother     Social History:  reports that he quit smoking about 24 years ago. His smoking use included cigarettes. He started smoking about 49 years ago. He has a 25 pack-year smoking history. He has never used smokeless tobacco. He reports that he does not drink alcohol and does not use drugs.  Allergies:  Allergies  Allergen Reactions   Lipitor [Atorvastatin] Other (See Comments)    UNSPECIFIED.    Zocor [Simvastatin] Other (See Comments)    UNSPECIFIED.     Medications: I have reviewed the patient's current medications.  The PMH, PSH, Medications, Allergies, and SH were reviewed and updated.  ROS: Constitutional: Negative for fever, weight loss and weight gain. Cardiovascular: Negative for chest pain and dyspnea on exertion. Respiratory: Is not experiencing shortness of breath at rest. Gastrointestinal: Negative for nausea and vomiting. Neurological: Negative for headaches. Psychiatric: The patient is not nervous/anxious  Blood pressure 118/73, pulse 71, height 6\' 1"  (1.854 m), weight 282 lb (127.9 kg), SpO2 94%.  PHYSICAL EXAM:  Exam: General: Well-developed, well-nourished Respiratory Respiratory effort: Equal inspiration and expiration without stridor Cardiovascular Peripheral Vascular: Warm extremities with equal color/perfusion Eyes: No nystagmus with equal extraocular motion bilaterally Neuro/Psych/Balance: Patient oriented to person, place, and time; Appropriate mood and affect; Gait is intact with no imbalance; Cranial nerves I-XII are intact Head and Face Inspection:  Normocephalic and atraumatic without mass or lesion Palpation: Facial skeleton intact without bony stepoffs Salivary Glands: No mass or tenderness Facial Strength: Facial motility symmetric and full bilaterally ENT Pinna: External ear intact and fully developed External canal: Canal is patent with intact skin Tympanic Membrane: Clear and mobile External Nose: No scar or anatomic deformity Anterior rhinoscopy with mild mucosal edema Lips, Teeth, and gums: Mucosa and teeth intact and viable Oral cavity/oropharynx: No erythema or exudate, no lesions present Neck Neck and Trachea: Midline trachea without mass or lesion Thyroid: No mass or nodularity Lymphatics: No lymphadenopathy  Procedure: none  Studies Reviewed:none  Assessment/Plan: Encounter Diagnoses  Name Primary?   Decreased hearing of both ears Yes   Sensorineural hearing loss (SNHL) of both ears    History of ear surgery     Assessment and Plan    Hearing Loss Chronic right ear hearing loss with a history of childhood mastoidectomy on the right for cholesteatoma. Reports increased difficulty  hearing conversations and television, suggesting progression of hearing loss. Previous 2022 hearing test indicated need for hearing aids, not yet obtained. Discussed cost of hearing aids and insurance coverage. Explained variable effectiveness of hearing aids based on type and degree of loss. Referral to audiologist for further evaluation and hearing aid options. - Order hearing test at [facility] location - Refer to audiologist for hearing aid evaluation and options  Ear Drainage Intermittent left ear drainage with crusty residue and itching, no pain. Right ear shows expected post-surgical changes, after TM reconstruction. No middle ear fluid, b/l TM and EAC intact without fluid or signs of infection. Discussed sweet oil for dry, itchy ears. - Recommend sweet oil for dry, itchy ears - Advise to purchase sweet oil over the counter.    Thank you for allowing me to participate in the care of this patient. Please do not hesitate to contact me with any questions or concerns.   Ashok Croon, MD Otolaryngology [facility] Phone: [phone] Fax: [phone]    07/05/2023, 1:41 PM

## 2023-07-05 NOTE — Patient Instructions (Signed)
-   use Sweet Oil for itchy ears available over the counter - schedule hearing test and hearing aid consultation after

## 2023-07-07 ENCOUNTER — Other Ambulatory Visit: Payer: Self-pay | Admitting: Cardiology

## 2023-07-10 DIAGNOSIS — Z23 Encounter for immunization: Secondary | ICD-10-CM | POA: Diagnosis not present

## 2023-07-21 ENCOUNTER — Other Ambulatory Visit: Payer: Self-pay | Admitting: Cardiology

## 2023-07-24 ENCOUNTER — Telehealth: Payer: Self-pay | Admitting: Cardiology

## 2023-07-24 ENCOUNTER — Other Ambulatory Visit (HOSPITAL_COMMUNITY): Payer: Self-pay

## 2023-07-24 ENCOUNTER — Other Ambulatory Visit: Payer: Self-pay

## 2023-07-24 MED ORDER — RAMIPRIL 5 MG PO CAPS
5.0000 mg | ORAL_CAPSULE | Freq: Two times a day (BID) | ORAL | 0 refills | Status: DC
  Filled 2023-07-24: qty 60, 30d supply, fill #0

## 2023-07-24 NOTE — Telephone Encounter (Signed)
Patient has office visit scheduled for January 2025. Sending 30 supply only

## 2023-07-24 NOTE — Telephone Encounter (Signed)
*  STAT* If patient is at the pharmacy, call can be transferred to refill team.   1. Which medications need to be refilled? (please list name of each medication and dose if known)  ramipril (ALTACE) 5 MG capsule   2. Which pharmacy/location (including street and city if local pharmacy) is medication to be sent to?  [id number] - [place], [place] - [address]    3. Do they need a 30 day or 90 day supply? 30  Patient is scheduled to be seen.

## 2023-07-26 ENCOUNTER — Ambulatory Visit (INDEPENDENT_AMBULATORY_CARE_PROVIDER_SITE_OTHER): Payer: PPO | Admitting: Audiology

## 2023-07-30 ENCOUNTER — Other Ambulatory Visit: Payer: Self-pay

## 2023-07-30 ENCOUNTER — Other Ambulatory Visit (HOSPITAL_COMMUNITY): Payer: Self-pay

## 2023-07-30 MED ORDER — RAMIPRIL 5 MG PO CAPS: 5.0000 mg | ORAL_CAPSULE | Freq: Two times a day (BID) | ORAL | 0 refills | Status: DC

## 2023-07-31 DIAGNOSIS — H2513 Age-related nuclear cataract, bilateral: Secondary | ICD-10-CM | POA: Diagnosis not present

## 2023-07-31 DIAGNOSIS — H524 Presbyopia: Secondary | ICD-10-CM | POA: Diagnosis not present

## 2023-07-31 DIAGNOSIS — H43811 Vitreous degeneration, right eye: Secondary | ICD-10-CM | POA: Diagnosis not present

## 2023-08-02 ENCOUNTER — Ambulatory Visit (INDEPENDENT_AMBULATORY_CARE_PROVIDER_SITE_OTHER): Payer: PPO | Admitting: Otolaryngology

## 2023-08-10 DIAGNOSIS — H903 Sensorineural hearing loss, bilateral: Secondary | ICD-10-CM | POA: Diagnosis not present

## 2023-08-17 ENCOUNTER — Other Ambulatory Visit: Payer: Self-pay | Admitting: Cardiovascular Disease

## 2023-08-20 ENCOUNTER — Telehealth: Payer: Self-pay | Admitting: Cardiology

## 2023-08-20 DIAGNOSIS — H903 Sensorineural hearing loss, bilateral: Secondary | ICD-10-CM | POA: Diagnosis not present

## 2023-08-20 MED ORDER — RAMIPRIL 5 MG PO CAPS: 5.0000 mg | ORAL_CAPSULE | Freq: Two times a day (BID) | ORAL | 0 refills | Status: DC

## 2023-08-20 MED ORDER — ROSUVASTATIN CALCIUM 20 MG PO TABS: 20.0000 mg | ORAL_TABLET | Freq: Every day | ORAL | 0 refills | Status: DC

## 2023-08-20 NOTE — Telephone Encounter (Signed)
*  STAT* If patient is at the pharmacy, call can be transferred to refill team.   1. Which medications need to be refilled? (please list name of each medication and dose if known)   ramipril (ALTACE) 5 MG capsule Take 1 capsule (5 mg total) by mouth 2 (two) times daily.   rosuvastatin (CRESTOR) 20 MG tablet TAKE 1 TABLET BY MOUTH EVERY DAY     4. Which pharmacy/location (including street and city if local pharmacy) is medication to be sent to? [id number] - [place], [place] - [address]       5. Do they need a 30 day or 90 day supply? 900  Pt scheduled for 09/21/23 w/ Dr. Herbie Baltimore

## 2023-08-20 NOTE — Telephone Encounter (Signed)
Pt's medications were sent to pt's pharmacy as requested. Confirmation received.  

## 2023-08-24 ENCOUNTER — Ambulatory Visit: Payer: PPO | Admitting: Physician Assistant

## 2023-09-21 ENCOUNTER — Ambulatory Visit: Payer: PPO | Attending: Cardiology | Admitting: Cardiology

## 2023-09-21 VITALS — BP 118/68 | HR 88 | Ht 73.0 in | Wt 279.0 lb

## 2023-09-21 DIAGNOSIS — I10 Essential (primary) hypertension: Secondary | ICD-10-CM | POA: Diagnosis not present

## 2023-09-21 DIAGNOSIS — I2510 Atherosclerotic heart disease of native coronary artery without angina pectoris: Secondary | ICD-10-CM

## 2023-09-21 DIAGNOSIS — I4891 Unspecified atrial fibrillation: Secondary | ICD-10-CM

## 2023-09-21 DIAGNOSIS — R7303 Prediabetes: Secondary | ICD-10-CM

## 2023-09-21 DIAGNOSIS — I2119 ST elevation (STEMI) myocardial infarction involving other coronary artery of inferior wall: Secondary | ICD-10-CM

## 2023-09-21 DIAGNOSIS — I739 Peripheral vascular disease, unspecified: Secondary | ICD-10-CM

## 2023-09-21 DIAGNOSIS — G4733 Obstructive sleep apnea (adult) (pediatric): Secondary | ICD-10-CM | POA: Diagnosis not present

## 2023-09-21 DIAGNOSIS — E785 Hyperlipidemia, unspecified: Secondary | ICD-10-CM

## 2023-09-21 MED ORDER — NITROGLYCERIN 0.4 MG SL SUBL: SUBLINGUAL_TABLET | SUBLINGUAL | 4 refills | Status: AC

## 2023-09-21 MED ORDER — METOPROLOL SUCCINATE ER 100 MG PO TB24: ORAL_TABLET | ORAL | 3 refills | Status: AC

## 2023-09-21 MED ORDER — RAMIPRIL 5 MG PO CAPS: 5.0000 mg | ORAL_CAPSULE | Freq: Two times a day (BID) | ORAL | 3 refills | Status: AC

## 2023-09-21 MED ORDER — ROSUVASTATIN CALCIUM 20 MG PO TABS: 20.0000 mg | ORAL_TABLET | Freq: Every day | ORAL | 3 refills | Status: AC

## 2023-09-21 NOTE — Patient Instructions (Signed)
 Medication Instructions:  No changes *If you need a refill on your cardiac medications before your next appointment, please call your pharmacy*   Lab Work: Not needed   Testing/Procedures:  Not needed Follow-Up: At [facility], you and your health needs are our priority.  As part of our continuing mission to provide you with exceptional heart care, we have created designated Provider Care Teams.  These Care Teams include your primary Cardiologist (physician) and Advanced Practice Providers (APPs -  Physician Assistants and Nurse Practitioners) who all work together to provide you with the care you need, when you need it.     Your next appointment:   12 month(s)  The format for your next appointment:   In Person  Provider:   Bryan Lemma, MD

## 2023-09-21 NOTE — Progress Notes (Unsigned)
Cardiology Office Note:  .   Date:  09/24/2023  ID:  Colin Ryan, DOB [birth year], MRN [id number] PCP: Farris Has, MD  [facility] Providers Cardiologist:  Bryan Lemma, MD     Chief Complaint  Patient presents with   Follow-up    Delayed almost 2-year   Coronary Artery Disease    No active angina.  Stable claudication    Patient Profile: .     Colin Ryan is an obese 73 y.o. male  with a PMH notable for who presents here for CAD-PCI (2001)-> CABG x3 (2003-LIMA-LAD, RIMA-OM, FrRad-OM). HTn, HLD & PAD presents for almost 2-year follow-up at the request of Farris Has, MD.  PMH:  CAD-IMI 01/2000-BMS PCI RCA x2 01/2001: PCI proximal LAD 10/2001: Cardiac catheter abnormal Myoview with inferior ischemia => 50% ostial left main, AVG LCx 90% ostial 70% distal RCA.  EF 45 to 50%. CABG for Left Main and RCA disease: LIMA to LAD, RIMA to RPDA, L Rad-OM Myoview January 2020: EF by computer 45%, mid LAD 55 to 60%.  Medium size moderate severity fixed defect in the basal to apical inferior, apical lateral and apical location consistent with prior infarct.  (Read as possible diaphragmatic attenuation-with normal wall motion.).  LOW RISK. TTE January 2020: Moderate LVH.  EF 55 to 60%.  No RWMA.  GR 1 DD.  Mild AI with aortic calcification. PAD:  (RFSA 100%, L SFA ~ 40%)  - med Rx; non-limiting claudication HTN, HLD    Colin Ryan was last seen on November 21, 2021: At that time he was doing well.  Try to get back and exercising.  His weight was bothering him when he comes to walking because of his joints hurting.  Try to ride his bike.  Making efforts to lose weight.  Energy improved with having lost some weight but sometimes feeling more tired at the end of the day.  Mild, nonlimiting claudication but no angina with exertion, do some exertional dyspnea and orthopnea that were improved.  He had lost 12 pounds.  Noted mild static dizziness.  Subjective  Discussed the use of AI scribe  software for clinical note transcription with the patient, who gave verbal consent to proceed.  History of Present Illness   The patient, with coronary artery disease and peripheral artery disease, presents for follow-up.  The patient has a history of coronary artery disease and underwent bypass surgery in 2002. He is currently asymptomatic with no chest pain, pressure, or tightness, and no orthopnea or paroxysmal nocturnal dyspnea. There are no palpitations or syncope, and he has not used nitroglycerin recently but keeps it as a precaution.  He has peripheral artery disease with a total occlusion of the right superficial femoral artery and closure of the peroneal artery. He experiences claudication in his thighs and calves when walking, which improves with rest. The longer he walks, the further he can go before experiencing pain. There are no sores or wounds on his feet or legs, but there is some skin discoloration on the top of his feet. He also experiences tingling and numbness in his feet, but not in his hands, and has cold feet. The left leg has about 40% narrowing, and both legs hurt when walking, primarily in the calves.  He has been actively managing his weight, having lost 40 pounds, regained 20, and then lost another 10 pounds. He has been going to the gym consistently for the last 30 days, focusing on stationary biking and  considering adding elliptical exercises. He plans to resume walking regularly, as he did when he initially lost weight.  He is currently taking ramipril 5 mg, Toprol 100 mg, aspirin, and Crestor 20 mg. No hematochezia, hematuria, or epistaxis.  His last cholesterol check in May 2024 showed a total cholesterol of 107, HDL of 38, LDL of 51, triglycerides of 93, and an A1c of 6.5, indicating borderline diabetes. He has not experienced any symptoms of atrial fibrillation since a postoperative episode.        Objective   Studies Reviewed: [name]   EKG  Interpretation Date/Time:  Friday September 21 2023 15:08:35 EST Ventricular Rate:  90 PR Interval:    QRS Duration:  100 QT Interval:  364 QTC Calculation: 445 R Axis:   -54  Text Interpretation: Atrial fibrillation Left axis deviation Inferior infarct (cited on or before 14-May-2014) When compared with ECG of 10-Nov-2014 11:31, Atrial fibrillation has replaced Sinus rhythm Nonspecific T wave abnormality now evident in Inferior leads Confirmed by Bryan Lemma ([id number]) on 09/23/2023 10:45:17 PM    No New Studies  Total Cholesterol: 107 (January 20, 2023) HDL: 38 (01/20/23) LDL: 51 (01/20/2023) Triglycerides: 93 (01-20-2023) Hemoglobin A1c: 6.5% (January 20, 2023) TSH: 2.25 mIU/L (2023-01-20) Potassium: 5.1 mmol/L (2023/01/20)  RADIOLOGY Doppler Ultrasound: Total occlusion of the right superficial femoral artery (SFA), two out of three arteries patent below the knee, peroneal artery occluded, left leg with approximately 40% narrowing  No results found for: "CHOL", "HDL", "LDLCALC", "LDLDIRECT", "TRIG", "CHOLHDL"   Risk Assessment/Calculations:    CHA2DS2-VASc Score = 4   This indicates a 4.8% annual risk of stroke. The patient's score is based upon: CHF History: 0 HTN History: 1 Diabetes History: 1 Stroke History: 0 Vascular Disease History: 1 Age Score: 1 Gender Score: 0   A-fib was noted during review of his in clinic EKG however this was after he had left.  He will be contacted to discuss findings.        Physical Exam:   VS:  BP 118/68 (BP Location: Right Arm, Patient Position: Sitting, Cuff Size: Large)   Pulse 88   Ht 6\' 1"  (1.854 m)   Wt 279 lb (126.6 kg)   SpO2 92%   BMI 36.81 kg/m    Wt Readings from Last 3 Encounters:  09/21/23 279 lb (126.6 kg)  07/05/23 282 lb (127.9 kg)  11/21/21 292 lb (132.5 kg)    GEN: Well nourished, well developed in no acute distress; moderately obese, but notable weight loss. NECK: No JVD; No carotid bruits CARDIAC: Normal S1, S2;  irregularly irregular rhythm with normal rate., no murmurs, rubs, gallops RESPIRATORY:  Clear to auscultation without rales, wheezing or rhonchi ; nonlabored, good air movement. ABDOMEN: Soft, non-tender, non-distended EXTREMITIES:  No edema; No deformity      ASSESSMENT AND PLAN: .    Problem List Items Addressed This Visit       Cardiology Problems   Coronary artery disease involving left main coronary artery - status post CABG x3 - Primary (Chronic)   Distant history of CAD with an inferior MI back in 2001 followed by LAD PCI 2002 then CABG in 2020 for left main disease. Most recent Myoview and echo in January 2020.  Myoview showed a large infarct related defect noted but no ischemia, and Echo revealed normal EF with no wall motion normality. He continues to do well with no active angina symptoms.  Discussed the need for a repeat stress test, but deferred due to patient's lack of symptoms.  (  However admittedly we may need to reconsider based on review of EKG) -Continue Ramipril 5mg  daily, Toprol 100mg  daily, Aspirin daily, and Crestor 20mg  daily. -Currently taking aspirin 81 mg which will be discontinued upon starting DOAC. -Plan Myoview Stress Test either this year or next year.      Relevant Medications   rosuvastatin (CRESTOR) 20 MG tablet   ramipril (ALTACE) 5 MG capsule   nitroGLYCERIN (NITROSTAT) 0.4 MG SL tablet   metoprolol succinate (TOPROL-XL) 100 MG 24 hr tablet   rivaroxaban (XARELTO) 20 MG TABS tablet   Other Relevant Orders   EKG 12-Lead (Completed)   Essential hypertension (Chronic)   Relevant Medications   rosuvastatin (CRESTOR) 20 MG tablet   ramipril (ALTACE) 5 MG capsule   nitroGLYCERIN (NITROSTAT) 0.4 MG SL tablet   metoprolol succinate (TOPROL-XL) 100 MG 24 hr tablet   rivaroxaban (XARELTO) 20 MG TABS tablet   Other Relevant Orders   EKG 12-Lead (Completed)   H/O ST elevation myocardial infarction (STEMI) of inferior wall -- RCA; inferior infarct on  Myoview. (Chronic)   Sizable infarct noted on Myoview, but normal wall motion on echo. No recurrent symptoms.      Relevant Medications   rosuvastatin (CRESTOR) 20 MG tablet   ramipril (ALTACE) 5 MG capsule   nitroGLYCERIN (NITROSTAT) 0.4 MG SL tablet   metoprolol succinate (TOPROL-XL) 100 MG 24 hr tablet   rivaroxaban (XARELTO) 20 MG TABS tablet   Hyperlipidemia with target low density lipoprotein (LDL) cholesterol less than 55 mg/dL (Chronic)   Hyperlipidemia Total cholesterol 107, HDL 38, LDL 51, Triglycerides 93 (May 2024).  Discussed the potential benefits of CoQ10 supplementation, but deferred due to lack of muscle aches or cramps. -Continue Crestor 20mg  daily. -Labs are followed by PCP.      Relevant Medications   rosuvastatin (CRESTOR) 20 MG tablet   ramipril (ALTACE) 5 MG capsule   nitroGLYCERIN (NITROSTAT) 0.4 MG SL tablet   metoprolol succinate (TOPROL-XL) 100 MG 24 hr tablet   rivaroxaban (XARELTO) 20 MG TABS tablet   New onset a-fib (HCC)   Unfortunately, his EKG today demonstrated A-fib with rate control.   Due to our new computerized system, I did not see this EKG until after the visit. He is completely asymptomatic, with no sensation of palpitations or irregular heartbeats.  Patient will be contacted this week to discuss his EKG.  If necessary we will do a virtual visit, but we will plan to initiate DOAC and stop aspirin. Will start Xarelto 20 mg daily As opposed to 1 year follow-up, we will plan for closer follow-up in 3 weeks with APP to reassess EKG, and if still in A-fib, would potentially consider cardioversion followed by 14-day Zio patch monitor to determine A-fib burden..       Relevant Medications   rosuvastatin (CRESTOR) 20 MG tablet   ramipril (ALTACE) 5 MG capsule   nitroGLYCERIN (NITROSTAT) 0.4 MG SL tablet   metoprolol succinate (TOPROL-XL) 100 MG 24 hr tablet   rivaroxaban (XARELTO) 20 MG TABS tablet   Peripheral arterial disease (HCC)  (Chronic)   Total occlusion of the right superficial femoral artery (SFA) with collateral circulation. Claudication in calves with walking, relieved by rest. No rest pain or non-healing sores. Discussed the benefits of walking to improve symptoms and the potential need for intervention if symptoms worsen or if non-healing sores develop. -Continue walking regimen. -Consider rechecking Dopplers if left leg symptoms worsen. -Return in 1 year unless symptoms worsen.      Relevant  Medications   rosuvastatin (CRESTOR) 20 MG tablet   ramipril (ALTACE) 5 MG capsule   nitroGLYCERIN (NITROSTAT) 0.4 MG SL tablet   metoprolol succinate (TOPROL-XL) 100 MG 24 hr tablet   rivaroxaban (XARELTO) 20 MG TABS tablet   Other Relevant Orders   EKG 12-Lead (Completed)     Other   Obstructive sleep apnea (Chronic)   Need to determine usage of CPAP -- esp in light of New onset A Fib.      Relevant Orders   EKG 12-Lead (Completed)   Prediabetes (Chronic)   A1C 6.5 (May 2024). Discussed the benefits of weight loss and exercise. -Continue current lifestyle modifications. -Will defer to PCP, but would strongly consider initiation of therapy if follow-up labs are not at goal.         Follow-Up: Return in about 3 weeks (around 10/12/2023) for Follow-up with APP.  I spent 41 minutes in the care of Colin Ryan today including reviewing outside labs from PCP via [facility] (2 min), face to face time discussing treatment options (27 min -> we talked a lot about the last 2 years his weight loss attempts, claudication symptoms, potential cardiac symptoms and plan going forward.), reviewing records from previous clinic notes and studies (3 minutes), 9 minutes dictating, and documenting in the encounter.   Addendum: After reviewing the patient's EKG following the appointment, I noted that the patient was in A-fib.  Was not symptomatic, was not obvious on exam.  I talked to the patient on September 24, 2023 with he and his  wife.  We spent additional 15 minutes discussing the diagnosis of atrial fibrillation and plans going forward.  Plan: Start Xarelto 20 mg daily He will begin monitoring his rhythm using his [place] We will arrange follow-up in 3 to 4 weeks with APP (beginning from the time he starts his Xarelto) to assess with EKG and symptoms. If he remains in A-fib-this would be persistent A-fib, would schedule for DCCV followed by 2-week Zio patch monitor to assess burden If he is no longer in A-fib-this would be paroxysmal A-fib, and DCCV would not be needed.  Would still proceed with 2-week Zio patch monitor to assess burden If it is paroxysmal atrial fibrillation, would simply continue rate control along with DOAC. Would also recommend checking 2D echo since he does have some mild valvular disease on previous echo to ensure that there is no progression of disease. We also discussed the possibility of going forward with Myoview stress testing that he was really not interested in doing.  He did not have a good experience with a pharmacological stress test in the past.  I think he would be agreeable to doing a Lexiscan Myoview to assess for ischemic etiology-this will be 1 year earlier than we had initially discussed..  I should see him back 2 to 3 months after APP visit.  Estle and his wife understood our discussion, questions were asked and answered.  They were grateful for the telephone call and agree with the plan going forward.     Signed, Marykay Lex, MD, MS Bryan Lemma, M.D., M.S. Interventional Cardiologist  [facility]  Pager # [phone] Phone # [phone] [address] [place], [place] [address]

## 2023-09-23 ENCOUNTER — Encounter: Payer: Self-pay | Admitting: Cardiology

## 2023-09-23 DIAGNOSIS — R7303 Prediabetes: Secondary | ICD-10-CM | POA: Insufficient documentation

## 2023-09-23 DIAGNOSIS — I4891 Unspecified atrial fibrillation: Secondary | ICD-10-CM | POA: Insufficient documentation

## 2023-09-23 DIAGNOSIS — I4819 Other persistent atrial fibrillation: Secondary | ICD-10-CM | POA: Insufficient documentation

## 2023-09-23 NOTE — Assessment & Plan Note (Signed)
Distant history of CAD with an inferior MI back in 2001 followed by LAD PCI 2002 then CABG in 2020 for left main disease. Most recent Myoview and echo in January 2020.  Myoview showed a large infarct related defect noted but no ischemia, and Echo revealed normal EF with no wall motion normality. He continues to do well with no active angina symptoms.  Discussed the need for a repeat stress test, but deferred due to patient's lack of symptoms.  (However admittedly we may need to reconsider based on review of EKG) -Continue Ramipril 5mg  daily, Toprol 100mg  daily, Aspirin daily, and Crestor 20mg  daily. -Currently taking aspirin 81 mg which will be discontinued upon starting DOAC. -Plan Myoview Stress Test either this year or next year.

## 2023-09-23 NOTE — Assessment & Plan Note (Signed)
Hyperlipidemia Total cholesterol 107, HDL 38, LDL 51, Triglycerides 93 (May 2024).  Discussed the potential benefits of CoQ10 supplementation, but deferred due to lack of muscle aches or cramps. -Continue Crestor 20mg  daily. -Labs are followed by PCP.

## 2023-09-23 NOTE — Assessment & Plan Note (Signed)
Unfortunately, his EKG today demonstrated A-fib with rate control.   Due to our new computerized system, I did not see this EKG until after the visit. He is completely asymptomatic, with no sensation of palpitations or irregular heartbeats.  Patient will be contacted this week to discuss his EKG.  If necessary we will do a virtual visit, but we will plan to initiate DOAC and stop aspirin. Will start Xarelto 20 mg daily As opposed to 1 year follow-up, we will plan for closer follow-up in 3 weeks with APP to reassess EKG, and if still in A-fib, would potentially consider cardioversion followed by 14-day Zio patch monitor to determine A-fib burden.[name]

## 2023-09-23 NOTE — Assessment & Plan Note (Signed)
Need to determine usage of CPAP -- esp in light of New onset A Fib.

## 2023-09-23 NOTE — Assessment & Plan Note (Signed)
Total occlusion of the right superficial femoral artery (SFA) with collateral circulation. Claudication in calves with walking, relieved by rest. No rest pain or non-healing sores. Discussed the benefits of walking to improve symptoms and the potential need for intervention if symptoms worsen or if non-healing sores develop. -Continue walking regimen. -Consider rechecking Dopplers if left leg symptoms worsen. -Return in 1 year unless symptoms worsen.

## 2023-09-23 NOTE — Assessment & Plan Note (Signed)
A1C 6.5 (May 2024). Discussed the benefits of weight loss and exercise. -Continue current lifestyle modifications. -Will defer to PCP, but would strongly consider initiation of therapy if follow-up labs are not at goal.

## 2023-09-23 NOTE — Assessment & Plan Note (Signed)
Sizable infarct noted on Myoview, but normal wall motion on echo. No recurrent symptoms.

## 2023-09-24 ENCOUNTER — Telehealth: Payer: Self-pay | Admitting: *Deleted

## 2023-09-24 MED ORDER — RIVAROXABAN 20 MG PO TABS: 20.0000 mg | ORAL_TABLET | Freq: Every day | ORAL | 4 refills | Status: AC

## 2023-09-24 NOTE — Addendum Note (Signed)
Addended by: Marykay Lex on: 09/24/2023 04:33 PM   Modules accepted: Orders

## 2023-09-24 NOTE — Telephone Encounter (Signed)
Late entry  per Dr Herbie Baltimore   RN received a message in regards to patient from Dr Herbie Baltimore:  " Jasmine December -I just got off the phone with Renae Fickle to talk about his EKG showing A-fib. We talked about the plan going forward and I addended my note. I also ordered Xarelto 20 mg daily. We would need to get him scheduled for an APP in about 3 to 4 weeks if he starts his Xarelto tonight or tomorrow. My recommendations for the APP are in the addendum note. He would then see me in a couple months after. Can you make sure that I did the Xarelto correctly. Thanks "

## 2023-09-24 NOTE — Telephone Encounter (Signed)
Called spoke to patient wife  [name].  Patient was present in the background.  Wife states , patient has not picked up medication  , awaiting for call from pharmacy.  RN informed  wife to contact pharmacy to see if medication is ready for pick up  because it was sent earlier today by Dr Herbie Baltimore.   RN informed wife and patient  there maybe  higher cost with medication because it  brand name- possible tier 3 on patient plan.  Wife states she will  contact pharmacy tomorrow morning and get details . RN will contact patient and wife tomorrow  for answer .  Also have  free 30 day  sample card if needed Wife voiced understanding.  Will schedule an appointment with APP  for 3 to 4 weeks  as requested by Dr Herbie Baltimore.

## 2023-09-25 NOTE — Telephone Encounter (Signed)
 RN spoke to wife. She states the medication will cost $ 117 for a 3 month supply . She staes they will be abble to aford medication.  Patient will pick up Xareleto 20 mg today and start.  Appointment has been schedule for 10/25/23 at 2:45 with E Monge NP   Verbalized understanding - not to miss any doses and take with meal

## 2023-09-26 ENCOUNTER — Telehealth: Payer: Self-pay | Admitting: Cardiology

## 2023-09-26 NOTE — Telephone Encounter (Signed)
 Pt c/o medication issue:  1. Name of Medication:   rivaroxaban  (XARELTO ) 20 MG TABS tablet    2. How are you currently taking this medication (dosage and times per day)? As written  3. Are you having a reaction (difficulty breathing--STAT)? no  4. What is your medication issue? On this medication can he have a beer every once in a while, take BC Powders, take Alka Seltzers ?

## 2023-09-27 NOTE — Telephone Encounter (Signed)
 He can have an occasional beer. He should not take any BC powders or any Alka Seltzer that has aspirin in it. Should also avoid ibuprofen and naproxen

## 2023-09-27 NOTE — Telephone Encounter (Signed)
 Called and spoke to Rico patient's wife. Verified name and DOB. Below message relayed per pharmacy. She verbalized understanding and agree.  Maccia, Melissa D, RPH-CPP2 minutes ago (9:54 AM)  He can have an occasional beer. He should not take any BC powders or any Alka Seltzer that has aspirin in it. Should also avoid ibuprofen and naproxen

## 2023-10-25 ENCOUNTER — Ambulatory Visit: Attending: Nurse Practitioner

## 2023-10-25 ENCOUNTER — Encounter: Payer: Self-pay | Admitting: Nurse Practitioner

## 2023-10-25 ENCOUNTER — Ambulatory Visit: Payer: PPO | Attending: Nurse Practitioner | Admitting: Nurse Practitioner

## 2023-10-25 VITALS — BP 108/68 | HR 80 | Ht 73.0 in | Wt 273.4 lb

## 2023-10-25 DIAGNOSIS — I4819 Other persistent atrial fibrillation: Secondary | ICD-10-CM | POA: Diagnosis not present

## 2023-10-25 DIAGNOSIS — I2510 Atherosclerotic heart disease of native coronary artery without angina pectoris: Secondary | ICD-10-CM

## 2023-10-25 DIAGNOSIS — G4733 Obstructive sleep apnea (adult) (pediatric): Secondary | ICD-10-CM

## 2023-10-25 DIAGNOSIS — R7303 Prediabetes: Secondary | ICD-10-CM

## 2023-10-25 DIAGNOSIS — I10 Essential (primary) hypertension: Secondary | ICD-10-CM | POA: Diagnosis not present

## 2023-10-25 DIAGNOSIS — E785 Hyperlipidemia, unspecified: Secondary | ICD-10-CM

## 2023-10-25 DIAGNOSIS — I739 Peripheral vascular disease, unspecified: Secondary | ICD-10-CM

## 2023-10-25 NOTE — Progress Notes (Unsigned)
Enrolled patient for a 14 day Zio XT monitor to be mailed to patients home  Ellyn Hack to read

## 2023-10-25 NOTE — Patient Instructions (Addendum)
 Medication Instructions:  Your physician recommends that you continue on your current medications as directed. Please refer to the Current Medication list given to you today.  *If you need a refill on your cardiac medications before your next appointment, please call your pharmacy*   Lab Work: NONE ordered at this time of appointment    Testing/Procedures: Your physician has requested that you have an echocardiogram. Echocardiography is a painless test that uses sound waves to create images of your heart. It provides your doctor with information about the size and shape of your heart and how well your heart's chambers and valves are working. This procedure takes approximately one hour. There are no restrictions for this procedure. Please do NOT wear cologne, perfume, aftershave, or lotions (deodorant is allowed). Please arrive 15 minutes prior to your appointment time.  Please note: We ask at that you not bring children with you during ultrasound (echo/ vascular) testing. Due to room size and safety concerns, children are not allowed in the ultrasound rooms during exams. Our front office staff cannot provide observation of children in our lobby area while testing is being conducted. An adult accompanying a patient to their appointment will only be allowed in the ultrasound room at the discretion of the ultrasound technician under special circumstances. We apologize for any inconvenience.   ZIO XT- Long Term Monitor Instructions  Your physician has requested you wear a ZIO patch monitor for 14 days.  This is a single patch monitor. [employer] supplies one patch monitor per enrollment. Additional stickers are not available. Please do not apply patch if you will be having a Nuclear Stress Test,  Echocardiogram, Cardiac CT, MRI, or Chest Xray during the period you would be wearing the  monitor. The patch cannot be worn during these tests. You cannot remove and re-apply the  ZIO XT patch monitor.   Your ZIO patch monitor will be mailed 3 day USPS to your address on file. It may take 3-5 days  to receive your monitor after you have been enrolled.  Once you have received your monitor, please review the enclosed instructions. Your monitor  has already been registered assigning a specific monitor serial # to you.  Billing and Patient Assistance Program Information  We have supplied [employer] with any of your insurance information on file for billing purposes. [employer] offers a sliding scale Patient Assistance Program for patients that do not have  insurance, or whose insurance does not completely cover the cost of the ZIO monitor.  You must apply for the Patient Assistance Program to qualify for this discounted rate.  To apply, please call [employer] at [phone], select option 4, select option 2, ask to apply for  Patient Assistance Program. Meredeth Ide will ask your household income, and how many people  are in your household. They will quote your out-of-pocket cost based on that information.  [employer] will also be able to set up a 60-month, interest-free payment plan if needed.  Applying the monitor   Shave hair from upper left chest.  Hold abrader disc by orange tab. Rub abrader in 40 strokes over the upper left chest as  indicated in your monitor instructions.  Clean area with 4 enclosed alcohol pads. Let dry.  Apply patch as indicated in monitor instructions. Patch will be placed under collarbone on left  side of chest with arrow pointing upward.  Rub patch adhesive wings for 2 minutes. Remove white label marked "1". Remove the white  label marked "2". Rub patch adhesive wings  for 2 additional minutes.  While looking in a mirror, press and release button in center of patch. A small green light will  flash 3-4 times. This will be your only indicator that the monitor has been turned on.  Do not shower for the first 24 hours. You may shower after the first 24 hours.  Press the button if  you feel a symptom. You will hear a small click. Record Date, Time and  Symptom in the Patient Logbook.  When you are ready to remove the patch, follow instructions on the last 2 pages of Patient  Logbook. Stick patch monitor onto the last page of Patient Logbook.  Place Patient Logbook in the blue and white box. Use locking tab on box and tape box closed  securely. The blue and white box has prepaid postage on it. Please place it in the mailbox as  soon as possible. Your physician should have your test results approximately 7 days after the  monitor has been mailed back to [facility].  Call [facility] Customer Care at [phone] if you have questions regarding  your ZIO XT patch monitor. Call them immediately if you see an orange light blinking on your  monitor.  If your monitor falls off in less than 4 days, contact our Monitor department at [phone].  If your monitor becomes loose or falls off after 4 days call [employer] at [phone] for  suggestions on securing your monitor   Follow-Up: At [facility], you and your health needs are our priority.  As part of our continuing mission to provide you with exceptional heart care, we have created designated Provider Care Teams.  These Care Teams include your primary Cardiologist (physician) and Advanced Practice Providers (APPs -  Physician Assistants and Nurse Practitioners) who all work together to provide you with the care you need, when you need it.  We recommend signing up for the patient portal called "MyChart".  Sign up information is provided on this After Visit Summary.  MyChart is used to connect with patients for Virtual Visits (Telemedicine).  Patients are able to view lab/test results, encounter notes, upcoming appointments, etc.  Non-urgent messages can be sent to your provider as well.   To learn more about what you can do with MyChart, go to [email or web address].    Your next appointment:   6  week(s)  Provider:   Bryan Lemma, MD  or Bernadene Person, NP        Other Instructions   1st Floor: - Lobby - Registration  - Pharmacy  - Lab - Cafe  2nd Floor: - PV Lab - Diagnostic Testing (echo, CT, nuclear med)  3rd Floor: - Vacant  4th Floor: - TCTS (cardiothoracic surgery) - AFib Clinic - Structural Heart Clinic - Vascular Surgery  - Vascular Ultrasound  5th Floor: - [facility] (general and EP) - Clinical Pharmacy for coumadin, hypertension, lipid, weight-loss medications, and med management appointments    Valet parking services will be available as well.

## 2023-10-25 NOTE — Progress Notes (Signed)
 Office Visit    Patient Name: Colin Ryan Date of Encounter: 10/25/2023  Primary Care Provider:  Farris Has, MD Primary Cardiologist:  Bryan Lemma, MD  Chief Complaint    73 year old male with a history of CAD s/p PCI-LAD in 2002, CABG x 3 (LIMA-LAD, RIMA-RPDA,fLRAD-OM) in 2003, paroxysmal atrial fibrillation, hypertension, hyperlipidemia, PAD, prediabetes, and OSA who presents for follow-up related to atrial fibrillation.  Past Medical History    Past Medical History:  Diagnosis Date   Anxiety    Arthritis    Basal cell carcinoma of cheek     left cheek   CAD S/P percutaneous coronary angioplasty 01/25/2000; 01/2001   INITIAL MI -->PCI to RCA;; 6/'02 Unstable Angina -- PCI-90% pLAD (BMS x 2) 2002; 2003 -->Ostial LM 50%, 90% D1 & AVGCx, 70% ISR in RCA   Dyslipidemia, goal LDL below 70    On statin. Monitored by PCP   Exertional dyspnea    Chronic   GERD (gastroesophageal reflux disease)    Takes Alka-Seltzer PRN   Hypertension    Left main coronary artery disease 11/05/01   50+ percent Left Main; 90% D1 and AV groove circumflex, 70% RCA ISR   Obesity, Class II, BMI 35-39.9, with comorbidity    Obstructive sleep apnea 09/10/2018   S/P CABG x 3 11/06/01   LIMA-LAD, RIMA-RPDA, fLRad-OM   ST elevation myocardial infarction (STEMI) of inferior wall, subsequent episode of care (HCC) 01/25/2000   100% mRCA  BMS 3.0 mm x 30 mm; 50% LAD, 80% D1   Umbilical hernia    Past Surgical History:  Procedure Laterality Date   CARDIAC CATHETERIZATION  11/05/2001    70% in stent  restenosis in RCA stent, 50+% distal  left main  diesae with patent LAD stent.    COLONOSCOPY     CORONARY ARTERY BYPASS GRAFT  11/06/01   LIMA-LAD, RIMA-RCA, [facility]-OM;    JOINT REPLACEMENT  02/2011   left knee   mastoid surgery     right ear as a child   NM MYOVIEW LTD  11/12/2012   INFEROPICAL SCAR  ,EF 55%   NM MYOVIEW LTD  08/29/2018   EF by calculation was 45% but more in the 55 to 60% range visually.   Medium size moderate severity defect in the basal -apical inferior as well as apical lateral and apical distribution.  Nonreversible/fixed defect.  Consistent with diaphragmatic attenuation or prior infarct.  Wall motion appears to be in agreement with echocardiogram not with previous studies suggesting inferior scar.   PERCUTANEOUS CORONARY STENT INTERVENTION (PCI-S)  01/25/2000   PCI-RCA occlusion; BMS 3.0 mm x 30 mm   PERCUTANEOUS CORONARY STENT INTERVENTION (PCI-S)  01/30/2001   Proximal LAD- 2 overlapping Velocity BMS stents 3.0 mm x 18 mm, 3.0 mm x 8 mm    SKIN CANCER EXCISION     TONSILLECTOMY     TOTAL KNEE ARTHROPLASTY  11/06/2011   Procedure: TOTAL KNEE ARTHROPLASTY;  Surgeon: Loanne Drilling, MD;  Location: [facility];  Service: Orthopedics;  Laterality: Right;   TRANSTHORACIC ECHOCARDIOGRAM  01/04/2011   Mild Anterior Hypokinesis, EF 50-55%, Mildly Dilaed Left Atrium, No Significant Valvular Lesions, Some Mld Aortic Sclerosis   TRANSTHORACIC ECHOCARDIOGRAM  08/27/2018    Moderate LVH.  Normal EF at 55 to 60%.  GR 1 DD.  Mild MR.  Mildly dilated ascending aorta 43 mm   UMBILICAL HERNIA REPAIR  08/22/2012   Procedure: HERNIA REPAIR UMBILICAL ADULT;  Surgeon: Robyne Askew, MD;  Location: [facility];  Service: General;  Laterality: N/A;  umbilical hernia repair     Allergies  Allergies  Allergen Reactions   Lipitor [Atorvastatin] Other (See Comments)    UNSPECIFIED.    Zocor [Simvastatin] Other (See Comments)    UNSPECIFIED.      Labs/Other Studies Reviewed    The following studies were reviewed today:  Cardiac Studies & Procedures   ______________________________________________________________________________________________   STRESS TESTS  MYOCARDIAL PERFUSION IMAGING 08/30/2018  Narrative  Nuclear stress EF is calculated at 45% but appears more in the range of 55-60%.  [place] EKG showed NSR with nonspecific T wave abnormality which did not change with Lexiscan  infusion.  There is a medium defect of moderate severity present in the basal inferior, mid inferior, apical inferior, apical lateral and apex location. The defect is non-reversible. In the setting of normal LVF this is consistent with diaphragmatic attenuation artifact. No ischemia noted.  This is a low risk study.  Prior nuclear scans have reported inferior and apical wall motion abnormality that correleated with inferior scar but wall motion appears normal on today's scan which is in agreement with echo of 08/27/2018.   ECHOCARDIOGRAM  ECHOCARDIOGRAM COMPLETE 08/27/2018  Narrative *[facility]* [address] [place], [place] [address] [phone]  ------------------------------------------------------------------- Echocardiography  Patient:    Farmer, Mccahill MR #:       [id number] Study Date: 08/27/2018 Gender:     M Age:        35 Height:     185.4 cm Weight:     139.2 kg BSA:        2.73 m^2 Pt. Status: Room:  ATTENDING    Donato Schultz, M.D. SONOGRAPHER  Clearence Ped, RCS PERFORMING   [facility] [place], Wynema Birch [phone] Janae Sauce [phone]  cc:  ------------------------------------------------------------------- LV EF: 55% -   60%  ------------------------------------------------------------------- Indications:      SOB (R06.09).  ------------------------------------------------------------------- History:   PMH:  HLD.  Coronary artery disease.  Risk factors: Hypertension.  ------------------------------------------------------------------- Study Conclusions  - Left ventricle: The cavity size was normal. Wall thickness was increased in a pattern of moderate LVH. Systolic function was normal. The estimated ejection fraction was in the range of 55% to 60%. Wall motion was normal; there were no regional wall motion abnormalities. Doppler parameters are consistent with abnormal left ventricular relaxation (grade 1  diastolic dysfunction). - Aortic valve: Trileaflet; mildly thickened, mildly calcified leaflets. There was mild regurgitation. - Aorta: Aortic root dimension: 43 mm (ED). - Ascending aorta: The ascending aorta was mildly dilated. - Mitral valve: Calcified annulus.  ------------------------------------------------------------------- Labs, prior tests, procedures, and surgery: Coronary artery bypass grafting.  ------------------------------------------------------------------- Study data:  Comparison was made to the study of 01/04/2011.  Study status:  Routine.  Procedure:  The patient reported no pain pre or post test. Transthoracic echocardiography. Image quality was adequate.          Echocardiography.  M-mode, complete 2D, spectral Doppler, and color Doppler.  Birthdate:  Patient birthdate: [birth year].  Age:  Patient is 73 yr old.  Sex:  Gender: male. BMI: 40.5 kg/m^2.  Blood pressure:     165/85  Patient status: Outpatient.  Study date:  Study date: 08/27/2018. Study time: 11:31 AM.  Location:  [facility]  -------------------------------------------------------------------  ------------------------------------------------------------------- Left ventricle:  The cavity size was normal. Wall thickness was increased in a pattern of moderate LVH. Systolic function was normal. The  estimated ejection fraction was in the range of 55% to 60%. Wall motion was normal; there were no regional wall motion abnormalities. Doppler parameters are consistent with abnormal left ventricular relaxation (grade 1 diastolic dysfunction).  ------------------------------------------------------------------- Aortic valve:   Trileaflet; mildly thickened, mildly calcified leaflets. Mobility was not restricted.  Doppler:  Transvalvular velocity was within the normal range. There was no stenosis. There was mild  regurgitation.  ------------------------------------------------------------------- Aorta:  Ascending aorta: The ascending aorta was mildly dilated.  ------------------------------------------------------------------- Mitral valve:   Calcified annulus. Mobility was not restricted. Doppler:  Transvalvular velocity was within the normal range. There was no evidence for stenosis. There was no regurgitation.    Peak gradient (D): 3 mm Hg.  ------------------------------------------------------------------- Left atrium:  The atrium was normal in size.  ------------------------------------------------------------------- Right ventricle:  The cavity size was normal. Wall thickness was normal. Systolic function was normal.  ------------------------------------------------------------------- Pulmonic valve:    Doppler:  Transvalvular velocity was within the normal range. There was no evidence for stenosis.  ------------------------------------------------------------------- Tricuspid valve:   Structurally normal valve.    Doppler: Transvalvular velocity was within the normal range. There was no regurgitation.  ------------------------------------------------------------------- Pulmonary artery:   The main pulmonary artery was normal-sized. Systolic pressure was within the normal range.  ------------------------------------------------------------------- Right atrium:  The atrium was normal in size.  ------------------------------------------------------------------- Pericardium:  There was no pericardial effusion.  ------------------------------------------------------------------- Systemic veins: Inferior vena cava: The vessel was normal in size. The respirophasic diameter changes were in the normal range (= 50%), consistent with normal central venous pressure. Diameter: 20 mm.  ------------------------------------------------------------------- Measurements  IVC                                         Value        Reference ID                                         20    mm     ----------  Left ventricle                             Value        Reference LV ID, ED, PLAX chordal            (H)     55.7  mm     43 - 52 LV ID, ES, PLAX chordal            (H)     39    mm     23 - 38 LV fx shortening, PLAX chordal             30    %      >=29 LV PW thickness, ED                        12    mm     ---------- IVS/LV PW ratio, ED                        1.2          <=1.3 Stroke volume, 2D  110   ml     ---------- Stroke volume/bsa, 2D                      40    ml/m^2 ---------- LV e&', lateral                             8.49  cm/s   ---------- LV E/e&', lateral                           9.54         ---------- LV e&', medial                              7.07  cm/s   ---------- LV E/e&', medial                            11.46        ---------- LV e&', average                             7.78  cm/s   ---------- LV E/e&', average                           10.41        ----------  Ventricular septum                         Value        Reference IVS thickness, ED                          14.4  mm     ----------  LVOT                                       Value        Reference LVOT ID, S                                 25    mm     ---------- LVOT area                                  4.91  cm^2   ---------- LVOT peak velocity, S                      109   cm/s   ---------- LVOT mean velocity, S                      67.6  cm/s   ---------- LVOT VTI, S                                22.5  cm     ----------  Aortic valve  Value        Reference Aortic regurg pressure half-time           748   ms     ----------  Aorta                                      Value        Reference Aortic root ID, ED                         43    mm     ----------  Left atrium                                Value        Reference LA  ID, A-P, ES                             37    mm     ---------- LA ID/bsa, A-P                             1.35  cm/m^2 <=2.2 LA volume, S                               57.4  ml     ---------- LA volume/bsa, S                           21    ml/m^2 ---------- LA volume, ES, 1-p A4C                     59.6  ml     ---------- LA volume/bsa, ES, 1-p A4C                 21.8  ml/m^2 ---------- LA volume, ES, 1-p A2C                     49.8  ml     ---------- LA volume/bsa, ES, 1-p A2C                 18.2  ml/m^2 ----------  Mitral valve                               Value        Reference Mitral E-wave peak velocity                81    cm/s   ---------- Mitral A-wave peak velocity                74.7  cm/s   ---------- Mitral deceleration time           (H)     306   ms     150 - 230 Mitral peak gradient, D                    3     mm Hg  ---------- Mitral E/A ratio, peak  1.1          ----------  Pulmonary arteries                         Value        Reference PA pressure, S, DP                         8     mm Hg  <=30  Tricuspid valve                            Value        Reference Tricuspid regurg peak velocity             115   cm/s   ---------- Tricuspid peak RV-RA gradient              5     mm Hg  ---------- Tricuspid maximal regurg velocity,         115   cm/s   ---------- PISA  Right atrium                               Value        Reference RA ID, S-I, ES, A4C                (H)     65    mm     34 - 49 RA area, ES, A4C                   (H)     22.3  cm^2   8.3 - 19.5 RA volume, ES, A/L                         62.3  ml     ---------- RA volume/bsa, ES, A/L                     22.8  ml/m^2 ----------  Systemic veins                             Value        Reference Estimated CVP                              3     mm Hg  ----------  Right ventricle                            Value        Reference TAPSE                                      23.9  mm      ---------- RV pressure, S, DP                         8     mm Hg  <=30 RV s&', lateral, S  12.9  cm/s   ----------  Legend: (L)  and  (H)  mark values outside specified reference range.  ------------------------------------------------------------------- Prepared and Electronically Authenticated by  Donato Schultz, M.D. 2020-01-07T14:05:18          ______________________________________________________________________________________________     Recent Labs: No results found for requested labs within last 365 days.  Recent Lipid Panel No results found for: "CHOL", "TRIG", "HDL", "CHOLHDL", "VLDL", "LDLCALC", "LDLDIRECT"  History of Present Illness    73 year old male with the above past medical history including CAD s/p PCI-LAD in 2002, CABG x 3 (LIMA-LAD, RIMA-RPDA,fLRAD-OM) in 2003, paroxysmal atrial fibrillation, hypertension, hyperlipidemia, PAD, prediabetes, and OSA.  He has a history of CAD with prior stenting, CABG as above.  Most recent Myoview in January 2020 showed large infarct related deficit noted, no ischemia.  Echocardiogram showed normal EF, no RWMA.  He was last seen in the office on 09/21/2023 and was stable from a cardiac standpoint.  He was exercising, riding his bike.  EKG showed new onset atrial fibrillation, rate controlled.  He was asymptomatic.  He was started on Xarelto.  Aspirin was discontinued.   He presents today for follow-up.  Since his last visit done well from a cardiac standpoint.  He remains in atrial fibrillation, however, he is asymptomatic. He denies any palpitations, chest pain, dyspnea, edema, PND, orthopnea, weight gain. He has been adherent to his Xarelto.  Overall, he reports feeling well.  Home Medications    Current Outpatient Medications  Medication Sig Dispense Refill   albuterol (PROVENTIL HFA;VENTOLIN HFA) 108 (90 BASE) MCG/ACT inhaler Inhale 2 puffs into the lungs every 4 (four) hours as needed for  wheezing or shortness of breath. 3.7 g 0   clonazePAM (KLONOPIN) 0.5 MG tablet Take 0.25-0.5 mg by mouth as needed.     metoprolol succinate (TOPROL-XL) 100 MG 24 hr tablet TAKE 1 TABLET EVERYDAY AT BEDTIME. 90 tablet 3   nitroGLYCERIN (NITROSTAT) 0.4 MG SL tablet PLACE 1 TABLET UNDER TONGUE EVERY 5 (FIVE) MINUTES AS NEEDED FOR CHEST PAIN. 25 tablet 4   ramipril (ALTACE) 5 MG capsule Take 1 capsule (5 mg total) by mouth 2 (two) times daily. 360 capsule 3   rivaroxaban (XARELTO) 20 MG TABS tablet Take 1 tablet (20 mg total) by mouth daily with supper. 90 tablet 4   rosuvastatin (CRESTOR) 20 MG tablet Take 1 tablet (20 mg total) by mouth daily. 90 tablet 3   traMADol (ULTRAM) 50 MG tablet Take 50 mg by mouth every 6 (six) hours as needed.     No current facility-administered medications for this visit.     Review of Systems    He denies chest pain, palpitations, dyspnea, pnd, orthopnea, n, v, dizziness, syncope, edema, weight gain, or early satiety. All other systems reviewed and are otherwise negative except as noted above.   Physical Exam    VS:  BP 108/68 (BP Location: Left Arm, Patient Position: Sitting)   Pulse 80   Ht 6\' 1"  (1.854 m)   Wt 273 lb 6.4 oz (124 kg)   SpO2 96%   BMI 36.07 kg/m   GEN: Well nourished, well developed, in no acute distress. HEENT: normal. Neck: Supple, no JVD, carotid bruits, or masses. Cardiac: IRIR, no murmurs, rubs, or gallops. No clubbing, cyanosis, edema.  Radials/DP/PT 2+ and equal bilaterally.  Respiratory:  Respirations regular and unlabored, clear to auscultation bilaterally. GI: Soft, nontender, nondistended, BS + x 4. MS: no deformity or atrophy. Skin: warm and dry, no rash.  Neuro:  Strength and sensation are intact. Psych: Normal affect.  Accessory Clinical Findings    ECG personally reviewed by me today - EKG Interpretation Date/Time:  Thursday October 25 2023 15:00:31 EST Ventricular Rate:  80 PR Interval:    QRS Duration:  114 QT  Interval:  392 QTC Calculation: 452 R Axis:   -63  Text Interpretation: Atrial fibrillation with premature ventricular or aberrantly conducted complexes Left axis deviation Inferior infarct (cited on or before 14-May-2014) When compared with ECG of 21-Sep-2023 15:08, Inverted T waves have replaced nonspecific T wave abnormality in Inferior leads Confirmed by Bernadene Person ([id number]) on 10/25/2023 3:20:19 PM  - no acute changes.   Lab Results  Component Value Date   WBC 8.6 08/16/2018   HGB 16.2 08/16/2018   HCT 47.3 08/16/2018   MCV 91 08/16/2018   PLT 259 08/16/2018   Lab Results  Component Value Date   CREATININE 1.06 11/10/2014   BUN 14 11/10/2014   NA 137 11/10/2014   K 4.2 11/10/2014   CL 101 11/10/2014   CO2 23 11/10/2014   Lab Results  Component Value Date   ALT 24 10/30/2011   AST 17 10/30/2011   ALKPHOS 121 (H) 10/30/2011   BILITOT 0.6 10/30/2011   No results found for: "CHOL", "HDL", "LDLCALC", "LDLDIRECT", "TRIG", "CHOLHDL"  No results found for: "HGBA1C"  Assessment & Plan    1. Paroxysmal atrial fibrillation: Recently diagnosed in January 2025 during office visit with Dr. Herbie Baltimore.  He remains in atrial fibrillation, rate controlled, asymptomatic.  We discussed possible DCCV, however, patient is hesitant to proceed at this time. We discussed possible referral to A-fib clinic, lab work, however, patient declines. Will proceed with 14-day ZIO monitor to assess A-fib burden.  Will update echocardiogram.  Encouraged ongoing adherence to Xarelto.  Reviewed ED precautions. Continue metoprolol, Xarelto.  2. CAD: S/p PCI-LAD in 2002, CABG x 3 (LIMA-LAD, RIMA-RPDA,fLRAD-OM) in 2003. Stable with no anginal symptoms. No indication for ischemic evaluation.  No ASA in the setting of chronic DOAC therapy.  Continue metoprolol, ramipril, Crestor.  3. Hypertension: BP well controlled. Continue current antihypertensive regimen.   4. Hyperlipidemia: LDL was 51 in 12/2022. Continue  Crestor.  5. PAD: He has a total occlusion of the right SFA with collateral circulation.  Denies any significant claudication. Consider repeat ABIs as clinically indicated.  Continue Crestor.  6. Prediabetes: A1c was 6.5 in 12/2022.  Monitored and managed per PCP.  7. OSA: Patient denies history, not on CPAP.  Denies history of snoring, daytime somnolence, fatigue, or observed episodes of apnea.  8. Disposition: Follow-up in 6 weeks.     Joylene Grapes, NP 10/25/2023, 5:53 PM

## 2023-11-19 DIAGNOSIS — I4819 Other persistent atrial fibrillation: Secondary | ICD-10-CM | POA: Diagnosis not present

## 2023-11-27 ENCOUNTER — Other Ambulatory Visit (HOSPITAL_COMMUNITY)

## 2023-11-28 ENCOUNTER — Ambulatory Visit (HOSPITAL_COMMUNITY)
Admission: RE | Admit: 2023-11-28 | Discharge: 2023-11-28 | Disposition: A | Discharge status: Home / Self Care | Source: Ambulatory Visit | Attending: Nurse Practitioner | Admitting: Nurse Practitioner

## 2023-11-28 DIAGNOSIS — I2510 Atherosclerotic heart disease of native coronary artery without angina pectoris: Secondary | ICD-10-CM | POA: Insufficient documentation

## 2023-11-28 DIAGNOSIS — I4819 Other persistent atrial fibrillation: Secondary | ICD-10-CM | POA: Diagnosis not present

## 2023-11-28 LAB — ECHOCARDIOGRAM COMPLETE
AR max vel: 0.88 cm2
AV Area VTI: 0.93 cm2
AV Area mean vel: 0.86 cm2
AV Mean grad: 10 mmHg
AV Peak grad: 18.8 mmHg
AV Vena cont: 0.48 cm
Ao pk vel: 2.17 m/s
Area-P 1/2: 4.31 cm2
MV M vel: 4.43 m/s
MV Peak grad: 78.5 mmHg
MV VTI: 0.32 cm2
P 1/2 time: 635 ms
Radius: 0.51 cm
S' Lateral: 3.24 cm

## 2023-11-30 ENCOUNTER — Ambulatory Visit: Attending: Nurse Practitioner | Admitting: Nurse Practitioner

## 2023-11-30 ENCOUNTER — Encounter: Payer: Self-pay | Admitting: Nurse Practitioner

## 2023-11-30 VITALS — BP 114/72 | HR 72 | Ht 73.0 in | Wt 274.0 lb

## 2023-11-30 DIAGNOSIS — R7303 Prediabetes: Secondary | ICD-10-CM | POA: Diagnosis not present

## 2023-11-30 DIAGNOSIS — E785 Hyperlipidemia, unspecified: Secondary | ICD-10-CM

## 2023-11-30 DIAGNOSIS — I10 Essential (primary) hypertension: Secondary | ICD-10-CM | POA: Diagnosis not present

## 2023-11-30 DIAGNOSIS — I739 Peripheral vascular disease, unspecified: Secondary | ICD-10-CM

## 2023-11-30 DIAGNOSIS — I350 Nonrheumatic aortic (valve) stenosis: Secondary | ICD-10-CM

## 2023-11-30 DIAGNOSIS — I340 Nonrheumatic mitral (valve) insufficiency: Secondary | ICD-10-CM | POA: Diagnosis not present

## 2023-11-30 DIAGNOSIS — I4819 Other persistent atrial fibrillation: Secondary | ICD-10-CM | POA: Diagnosis not present

## 2023-11-30 DIAGNOSIS — I2510 Atherosclerotic heart disease of native coronary artery without angina pectoris: Secondary | ICD-10-CM

## 2023-11-30 DIAGNOSIS — I351 Nonrheumatic aortic (valve) insufficiency: Secondary | ICD-10-CM

## 2023-11-30 DIAGNOSIS — G4733 Obstructive sleep apnea (adult) (pediatric): Secondary | ICD-10-CM | POA: Diagnosis not present

## 2023-11-30 NOTE — Patient Instructions (Signed)
 Medication Instructions:  Your physician recommends that you continue on your current medications as directed. Please refer to the Current Medication list given to you today.  *If you need a refill on your cardiac medications before your next appointment, please call your pharmacy*  Lab Work: NONE ordered at this time of appointment   Testing/Procedures: NONE ordered at this time of appointment   Follow-Up: At [facility], you and your health needs are our priority.  As part of our continuing mission to provide you with exceptional heart care, our providers are all part of one team.  This team includes your primary Cardiologist (physician) and Advanced Practice Providers or APPs (Physician Assistants and Nurse Practitioners) who all work together to provide you with the care you need, when you need it.  Your next appointment:   4-6 month(s)  Provider:   Bryan Lemma, MD     We recommend signing up for the patient portal called "MyChart".  Sign up information is provided on this After Visit Summary.  MyChart is used to connect with patients for Virtual Visits (Telemedicine).  Patients are able to view lab/test results, encounter notes, upcoming appointments, etc.  Non-urgent messages can be sent to your provider as well.   To learn more about what you can do with MyChart, go to [email or web address].   Other Instructions       1st Floor: - Lobby - Registration  - Pharmacy  - Lab - Cafe  2nd Floor: - PV Lab - Diagnostic Testing (echo, CT, nuclear med)  3rd Floor: - Vacant  4th Floor: - TCTS (cardiothoracic surgery) - AFib Clinic - Structural Heart Clinic - Vascular Surgery  - Vascular Ultrasound  5th Floor: - [facility] (general and EP) - Clinical Pharmacy for coumadin, hypertension, lipid, weight-loss medications, and med management appointments    Valet parking services will be available as well.

## 2023-11-30 NOTE — Progress Notes (Signed)
 Office Visit    Patient Name: Colin Ryan Date of Encounter: 11/30/2023  Primary Care Provider:  Ronna Coho, MD Primary Cardiologist:  Randene Bustard, MD  Chief Complaint    73 year old male with a history of CAD s/p PCI-LAD in 2002, CABG x 3 (LIMA-LAD, RIMA-RPDA,fLRAD-OM) in 2003, persistent atrial fibrillation, mitral valve regurgitation, aortic valve regurgitation, mild aortic stenosis, hypertension, hyperlipidemia, PAD, prediabetes, and OSA who presents for follow-up related to atrial fibrillation.   Past Medical History    Past Medical History:  Diagnosis Date   Anxiety    Arthritis    Basal cell carcinoma of cheek     left cheek   CAD S/P percutaneous coronary angioplasty 01/25/2000; 01/2001   INITIAL MI -->PCI to RCA;; 6/'02 Unstable Angina -- PCI-90% pLAD (BMS x 2) 2002; 2003 -->Ostial LM 50%, 90% D1 & AVGCx, 70% ISR in RCA   Dyslipidemia, goal LDL below 70    On statin. Monitored by PCP   Exertional dyspnea    Chronic   GERD (gastroesophageal reflux disease)    Takes Alka-Seltzer PRN   Hypertension    Left main coronary artery disease 11/05/01   50+ percent Left Main; 90% D1 and AV groove circumflex, 70% RCA ISR   Obesity, Class II, BMI 35-39.9, with comorbidity    Obstructive sleep apnea 09/10/2018   S/P CABG x 3 11/06/01   LIMA-LAD, RIMA-RPDA, fLRad-OM   ST elevation myocardial infarction (STEMI) of inferior wall, subsequent episode of care (HCC) 01/25/2000   100% mRCA  BMS 3.0 mm x 30 mm; 50% LAD, 80% D1   Umbilical hernia    Past Surgical History:  Procedure Laterality Date   CARDIAC CATHETERIZATION  11/05/2001    70% in stent  restenosis in RCA stent, 50+% distal  left main  diesae with patent LAD stent.    COLONOSCOPY     CORONARY ARTERY BYPASS GRAFT  11/06/01   LIMA-LAD, RIMA-RCA, [facility]-OM;    JOINT REPLACEMENT  02/2011   left knee   mastoid surgery     right ear as a child   NM MYOVIEW LTD  11/12/2012   INFEROPICAL SCAR  ,EF 55%   NM MYOVIEW LTD   08/29/2018   EF by calculation was 45% but more in the 55 to 60% range visually.  Medium size moderate severity defect in the basal -apical inferior as well as apical lateral and apical distribution.  Nonreversible/fixed defect.  Consistent with diaphragmatic attenuation or prior infarct.  Wall motion appears to be in agreement with echocardiogram not with previous studies suggesting inferior scar.   PERCUTANEOUS CORONARY STENT INTERVENTION (PCI-S)  01/25/2000   PCI-RCA occlusion; BMS 3.0 mm x 30 mm   PERCUTANEOUS CORONARY STENT INTERVENTION (PCI-S)  01/30/2001   Proximal LAD- 2 overlapping Velocity BMS stents 3.0 mm x 18 mm, 3.0 mm x 8 mm    SKIN CANCER EXCISION     TONSILLECTOMY     TOTAL KNEE ARTHROPLASTY  11/06/2011   Procedure: TOTAL KNEE ARTHROPLASTY;  Surgeon: Aurther Blue, MD;  Location: [facility];  Service: Orthopedics;  Laterality: Right;   TRANSTHORACIC ECHOCARDIOGRAM  01/04/2011   Mild Anterior Hypokinesis, EF 50-55%, Mildly Dilaed Left Atrium, No Significant Valvular Lesions, Some Mld Aortic Sclerosis   TRANSTHORACIC ECHOCARDIOGRAM  08/27/2018    Moderate LVH.  Normal EF at 55 to 60%.  GR 1 DD.  Mild MR.  Mildly dilated ascending aorta 43 mm   UMBILICAL HERNIA REPAIR  08/22/2012   Procedure: HERNIA  REPAIR UMBILICAL ADULT;  Surgeon: Mayme Spearman, MD;  Location[facility];  Service: General;  Laterality: N/A;  umbilical hernia repair     Allergies  Allergies  Allergen Reactions   Lipitor [Atorvastatin] Other (See Comments)    UNSPECIFIED.    Zocor [Simvastatin] Other (See Comments)    UNSPECIFIED.      Labs/Other Studies Reviewed    The following studies were reviewed today:  Cardiac Studies & Procedures   ______________________________________________________________________________________________   STRESS TESTS  MYOCARDIAL PERFUSION IMAGING 08/30/2018  Narrative  Nuclear stress EF is calculated at 45% but appears more in the range of 55-60%.  [place] EKG showed  NSR with nonspecific T wave abnormality which did not change with Lexiscan infusion.  There is a medium defect of moderate severity present in the basal inferior, mid inferior, apical inferior, apical lateral and apex location. The defect is non-reversible. In the setting of normal LVF this is consistent with diaphragmatic attenuation artifact. No ischemia noted.  This is a low risk study.  Prior nuclear scans have reported inferior and apical wall motion abnormality that correleated with inferior scar but wall motion appears normal on today's scan which is in agreement with echo of 08/27/2018.   ECHOCARDIOGRAM  ECHOCARDIOGRAM COMPLETE 11/28/2023  Narrative ECHOCARDIOGRAM REPORT    Patient Name:   Colin Ryan Date of Exam: 11/28/2023 Medical Rec #:  [id number]    Height:       73.0 in Accession #:    [id number]   Weight:       273.4 lb Date of Birth:  1951-04-16    BSA:          2.457 m Patient Age:    72 years     BP:           108/68 mmHg Patient Gender: M            HR:           81 bpm. Exam Location:  Outpatient  Procedure: 2D Echo, Cardiac Doppler and Color Doppler (Both Spectral and Color Flow Doppler were utilized during procedure).  Indications:    Persistent atrial fibrillation (HCC) [I48.19 (ICD-10-CM)]; Coronary artery disease involving native coronary artery of native heart without angina pectoris [I25.10 (ICD-10-CM)]  History:        Patient has prior history of Echocardiogram examinations, most recent 08/27/2018. CAD, PAD; Risk Factors:Hypertension and Former Smoker.  Sonographer:    Delford Felling MHA, RDMS, RVT, RDCS Referring Phys: [id number] Emonnie Cannady C Jaelen Soth  IMPRESSIONS   1. Left ventricular ejection fraction, by estimation, is 50 to 55%. The left ventricle has low normal function. The left ventricle has no regional wall motion abnormalities. Left ventricular diastolic parameters are indeterminate. 2. Right ventricular systolic function is normal. The right  ventricular size is mildly enlarged. There is normal pulmonary artery systolic pressure. The estimated right ventricular systolic pressure is 14.8 mmHg. 3. The mitral valve is normal in structure. Mild mitral valve regurgitation. No evidence of mitral stenosis. Moderate mitral annular calcification. 4. DVI: 0.47. The aortic valve is calcified. There is moderate calcification of the aortic valve. There is moderate thickening of the aortic valve. Aortic valve regurgitation is mild. Mild aortic valve stenosis. Aortic valve mean gradient measures 10.0 mmHg. Aortic valve Vmax measures 2.17 m/s. 5. The inferior vena cava is dilated in size with >50% respiratory variability, suggesting right atrial pressure of 8 mmHg.  FINDINGS Left Ventricle: Left ventricular ejection fraction, by estimation, is 50 to  55%. The left ventricle has low normal function. The left ventricle has no regional wall motion abnormalities. Strain was performed and the global longitudinal strain is indeterminate. 3D ejection fraction reviewed and evaluated as part of the interpretation. Alternate measurement of EF is felt to be most reflective of LV function. The left ventricular internal cavity size was normal in size. There is no left ventricular hypertrophy. Left ventricular diastolic parameters are indeterminate.  Right Ventricle: The right ventricular size is mildly enlarged. No increase in right ventricular wall thickness. Right ventricular systolic function is normal. There is normal pulmonary artery systolic pressure. The tricuspid regurgitant velocity is 1.30 m/s, and with an assumed right atrial pressure of 8 mmHg, the estimated right ventricular systolic pressure is 14.8 mmHg.  Left Atrium: Left atrial size was normal in size.  Right Atrium: Right atrial size was normal in size.  Pericardium: There is no evidence of pericardial effusion.  Mitral Valve: The mitral valve is normal in structure. Moderate mitral annular  calcification. Mild mitral valve regurgitation. No evidence of mitral valve stenosis. MV peak gradient, 7.0 mmHg. The mean mitral valve gradient is 3.0 mmHg.  Tricuspid Valve: The tricuspid valve is normal in structure. Tricuspid valve regurgitation is trivial. No evidence of tricuspid stenosis.  Aortic Valve: DVI: 0.47. The aortic valve is calcified. There is moderate calcification of the aortic valve. There is moderate thickening of the aortic valve. Aortic valve regurgitation is mild. Aortic regurgitation PHT measures 635 msec. Mild aortic stenosis is present. Aortic valve mean gradient measures 10.0 mmHg. Aortic valve peak gradient measures 18.8 mmHg. Aortic valve area, by VTI measures 0.93 cm.  Pulmonic Valve: The pulmonic valve was normal in structure. Pulmonic valve regurgitation is trivial. No evidence of pulmonic stenosis.  Aorta: The aortic root is normal in size and structure.  Venous: The inferior vena cava is dilated in size with greater than 50% respiratory variability, suggesting right atrial pressure of 8 mmHg.  IAS/Shunts: No atrial level shunt detected by color flow Doppler.   LEFT VENTRICLE PLAX 2D LVIDd:         4.57 cm   Diastology LVIDs:         3.24 cm   LV e' medial:    9.14 cm/s LV PW:         1.29 cm   LV E/e' medial:  11.3 LV IVS:        0.96 cm   LV e' lateral:   11.90 cm/s LVOT diam:     1.51 cm   LV E/e' lateral: 8.7 LV SV:         39 LV SV Index:   16 LVOT Area:     1.79 cm  3D Volume EF: 3D EF:        42 % LV EDV:       195 ml LV ESV:       113 ml LV SV:        82 ml  RIGHT VENTRICLE            IVC RV Basal diam:  4.30 cm    IVC diam: 2.73 cm RV Mid diam:    3.70 cm RV S prime:     5.77 cm/s TAPSE (M-mode): 1.1 cm  LEFT ATRIUM             Index        RIGHT ATRIUM           Index LA diam:  5.36 cm 2.18 cm/m   RA Area:     21.70 cm LA Vol (A2C):   52.6 ml 21.41 ml/m  RA Volume:   62.80 ml  25.56 ml/m LA Vol (A4C):   53.1 ml 21.61  ml/m LA Biplane Vol: 56.3 ml 22.91 ml/m AORTIC VALVE AV Area (Vmax):    0.88 cm AV Area (Vmean):   0.86 cm AV Area (VTI):     0.93 cm AV Vmax:           216.75 cm/s AV Vmean:          145.750 cm/s AV VTI:            0.421 m AV Peak Grad:      18.8 mmHg AV Mean Grad:      10.0 mmHg LVOT Vmax:         106.00 cm/s LVOT Vmean:        70.200 cm/s LVOT VTI:          0.219 m LVOT/AV VTI ratio: 0.52 AI PHT:            635 msec AR Vena Contracta: 0.48 cm  AORTA Ao Root diam: 4.60 cm Ao Asc diam:  3.22 cm  MITRAL VALVE                            TRICUSPID VALVE MV Area (PHT): 4.31 cm                 TR Peak grad:   6.8 mmHg MV Area VTI:   0.32 cm                 TR Vmax:        130.00 cm/s MV Peak grad:  7.0 mmHg MV Mean grad:  3.0 mmHg                 SHUNTS MV Vmax:       1.32 m/s                 Systemic VTI:  0.22 m MV Vmean:      75.8 cm/s                Systemic Diam: 1.51 cm MV VTI:        1.21 m MV Decel Time: 176 msec MR Peak grad:    78.5 mmHg MR Mean grad:    39.0 mmHg MR Vmax:         443.00 cm/s MR PISA Nyquist:             -31.0 m/s MR PISA:         1.63 cm MR PISA Radius:  0.51 cm MV E velocity: 103.00 cm/s MV A velocity: 52.80 cm/s MV E/A ratio:  1.95  Dorothye Gathers MD Electronically signed by Dorothye Gathers MD Signature Date/Time: 11/28/2023/3:03:18 PM    Final          ______________________________________________________________________________________________     Recent Labs: No results found for requested labs within last 365 days.  Recent Lipid Panel No results found for: "CHOL", "TRIG", "HDL", "CHOLHDL", "VLDL", "LDLCALC", "LDLDIRECT"  History of Present Illness    73 year old male with the above past medical history including CAD s/p PCI-LAD in 2002, CABG x 3 (LIMA-LAD, RIMA-RPDA,fLRAD-OM) in 2003, persistent atrial fibrillation,  mitral valve regurgitation, aortic valve regurgitation, mild aortic stenosis, hypertension, hyperlipidemia,  PAD, prediabetes, and OSA.   He has a  history of CAD with prior stenting, CABG x3 as above.  Most recent Myoview in January 2020 showed large infarct related deficit noted, no ischemia.  Echocardiogram showed normal EF, no RWMA.  At his follow-up visit in 08/2023 he was noted to be in new onset atrial fibrillation, rate controlled, asymptomatic.  He was started on Xarelto.  Aspirin was discontinued.  He was last seen in the office on 10/25/2023 and was stable from a cardiac standpoint.  He remained in atrial fibrillation, he was cardiac unaware.  He declined DCCV, lab work, declined referral to A-fib clinic.  Echocardiogram in 11/2023 showed EF 50 to 55%, low normal LV function, no RWMA, normal LV systolic function, mildly enlarged LV, normal PASP, mild mitral valve regurgitation, moderate MAC, moderate calcification of the aortic valve, mild aortic valve regurgitation, mild aortic valve stenosis, mean gradient 10 mmHg.  14-day ZIO monitor (preliminary report) revealed 100% A-fib burden, average heart rate 87 bpm, 1 run of VT lasting 4 beats.    He presents today for follow-up.  Since his last visit he has been stable from a cardiac standpoint.  He is completely asymptomatic with his atrial fibrillation. He is exercising regularly. Overall he reports feeling well.    Home Medications    Current Outpatient Medications  Medication Sig Dispense Refill   albuterol (PROVENTIL HFA;VENTOLIN HFA) 108 (90 BASE) MCG/ACT inhaler Inhale 2 puffs into the lungs every 4 (four) hours as needed for wheezing or shortness of breath. 3.7 g 0   clonazePAM (KLONOPIN) 0.5 MG tablet Take 0.25-0.5 mg by mouth as needed.     metoprolol succinate (TOPROL-XL) 100 MG 24 hr tablet TAKE 1 TABLET EVERYDAY AT BEDTIME. 90 tablet 3   nitroGLYCERIN (NITROSTAT) 0.4 MG SL tablet PLACE 1 TABLET UNDER TONGUE EVERY 5 (FIVE) MINUTES AS NEEDED FOR CHEST PAIN. 25 tablet 4   ramipril (ALTACE) 5 MG capsule Take 1 capsule (5 mg total) by mouth 2 (two)  times daily. 360 capsule 3   rivaroxaban (XARELTO) 20 MG TABS tablet Take 1 tablet (20 mg total) by mouth daily with supper. 90 tablet 4   rosuvastatin (CRESTOR) 20 MG tablet Take 1 tablet (20 mg total) by mouth daily. 90 tablet 3   traMADol (ULTRAM) 50 MG tablet Take 50 mg by mouth every 6 (six) hours as needed.     No current facility-administered medications for this visit.     Review of Systems    He denies chest pain, palpitations, dyspnea, pnd, orthopnea, n, v, dizziness, syncope, edema, weight gain, or early satiety. All other systems reviewed and are otherwise negative except as noted above.   Physical Exam    VS:  BP 114/72 (BP Location: Left Arm, Patient Position: Sitting, Cuff Size: Large)   Pulse 72   Ht 6\' 1"  (1.854 m)   Wt 274 lb (124.3 kg)   SpO2 96%   BMI 36.15 kg/m ` GEN: Well nourished, well developed, in no acute distress. HEENT: normal. Neck: Supple, no JVD, carotid bruits, or masses. Cardiac: RRR, 2/6 murmur, no rubs, or gallops. No clubbing, cyanosis, edema.  Radials/DP/PT 2+ and equal bilaterally.  Respiratory:  Respirations regular and unlabored, clear to auscultation bilaterally. GI: Soft, nontender, nondistended, BS + x 4. MS: no deformity or atrophy. Skin: warm and dry, no rash. Neuro:  Strength and sensation are intact. Psych: Normal affect.  Accessory Clinical Findings    ECG personally reviewed by me today - EKG Interpretation Date/Time:  Friday November 30 2023 13:38:36 EDT  Ventricular Rate:  72 PR Interval:    QRS Duration:  106 QT Interval:  396 QTC Calculation: 433 R Axis:   -57  Text Interpretation: Atrial fibrillation Left axis deviation Inferior infarct (cited on or before 14-May-2014) When compared with ECG of 25-Oct-2023 15:00, No significant change was found Confirmed by Marlana Silvan ([id number]) on 11/30/2023 1:41:29 PM  - no acute changes.   Lab Results  Component Value Date   WBC 8.6 08/16/2018   HGB 16.2 08/16/2018   HCT 47.3  08/16/2018   MCV 91 08/16/2018   PLT 259 08/16/2018   Lab Results  Component Value Date   CREATININE 1.06 11/10/2014   BUN 14 11/10/2014   NA 137 11/10/2014   K 4.2 11/10/2014   CL 101 11/10/2014   CO2 23 11/10/2014   Lab Results  Component Value Date   ALT 24 10/30/2011   AST 17 10/30/2011   ALKPHOS 121 (H) 10/30/2011   BILITOT 0.6 10/30/2011   No results found for: "CHOL", "HDL", "LDLCALC", "LDLDIRECT", "TRIG", "CHOLHDL"  No results found for: "HGBA1C"  Assessment & Plan   1. Persistent  atrial fibrillation: Recently diagnosed in January 2025 during office visit with Dr. Addie Holstein. Echocardiogram in 11/2023 showed EF 50 to 55%, low normal LV function, no RWMA, normal LV systolic function, mildly enlarged LV, normal PASP, mild mitral valve regurgitation, moderate MAC, moderate calcification of the aortic valve, mild aortic valve regurgitation, mild aortic valve stenosis, mean gradient 10 mmHg.  14-day ZIO monitor (preliminary report) revealed 100% A-fib burden, average heart rate 87 bpm, 1 run of VT lasting 4 beats.  He remains in atrial fibrillation, rate controlled, asymptomatic. Euvolemic and well compensated on exam. We again had a conversation about possible treatment options including cardioversion, antiarrhythmic therapy, ablation.  He is hesitant to pursue any of these options at this time.  However, he states that he will defer to Dr. Jill Motes recommendation.  I will reach out to Dr. Addie Holstein and have him review echocardiogram, monitor, notes from most recent office visit. Reviewed ED precautions. For now, will continue rate control strategy.  Continue metoprolol, Xarelto.   2. CAD: S/p PCI-LAD in 2002, CABG x 3 (LIMA-LAD, RIMA-RPDA,fLRAD-OM) in 2003. Stable with no anginal symptoms. No indication for ischemic evaluation.  No ASA in the setting of chronic DOAC therapy.  Continue metoprolol, ramipril, Crestor.   3.  Valvular heart disease: Recent echocardiogram 11/2023 showed mild  mitral valve regurgitation, moderate MAC, mild aortic valve regurgitation, mild aortic valve stenosis, mean gradient 10 mmHg.  We discussed echocardiogram findings in detail today and office visit.  We also discussed the potential progression of aortic stenosis, warning symptoms, treatment options.  Will plan for repeat echocardiogram in 1 year for monitoring of aortic stenosis.   4. Hypertension: BP well controlled. Continue current antihypertensive regimen.     5. Hyperlipidemia: LDL was 51 in 12/2022. Continue Crestor.   6. PAD: He has a total occlusion of the right SFA with collateral circulation.  Denies any significant claudication. Consider repeat ABIs as clinically indicated.  Continue Crestor.   7. Prediabetes: A1c was 6.5 in 12/2022.  Monitored and managed per PCP.   8. OSA: Patient denies history, not on CPAP.  Denies history of snoring, daytime somnolence, fatigue, or observed episodes of apnea.   9. Disposition: Follow-up in 4 to 6 months with Dr. Addie Holstein.      Jude Norton, NP 11/30/2023, 1:42 PM

## 2023-12-02 ENCOUNTER — Encounter: Payer: Self-pay | Admitting: Nurse Practitioner

## 2024-02-07 ENCOUNTER — Ambulatory Visit: Payer: Self-pay | Admitting: Nurse Practitioner

## 2024-02-07 DIAGNOSIS — I4819 Other persistent atrial fibrillation: Secondary | ICD-10-CM | POA: Diagnosis not present

## 2024-02-08 NOTE — Progress Notes (Signed)
 Left voice mail

## 2024-02-11 NOTE — Progress Notes (Signed)
 Left voice mail

## 2024-02-12 DIAGNOSIS — I739 Peripheral vascular disease, unspecified: Secondary | ICD-10-CM | POA: Diagnosis not present

## 2024-02-12 DIAGNOSIS — R351 Nocturia: Secondary | ICD-10-CM | POA: Diagnosis not present

## 2024-02-12 DIAGNOSIS — Z0000 Encounter for general adult medical examination without abnormal findings: Secondary | ICD-10-CM | POA: Diagnosis not present

## 2024-02-12 DIAGNOSIS — E1169 Type 2 diabetes mellitus with other specified complication: Secondary | ICD-10-CM | POA: Diagnosis not present

## 2024-02-12 DIAGNOSIS — F411 Generalized anxiety disorder: Secondary | ICD-10-CM | POA: Diagnosis not present

## 2024-02-12 DIAGNOSIS — I4891 Unspecified atrial fibrillation: Secondary | ICD-10-CM | POA: Diagnosis not present

## 2024-02-12 DIAGNOSIS — I2510 Atherosclerotic heart disease of native coronary artery without angina pectoris: Secondary | ICD-10-CM | POA: Diagnosis not present

## 2024-02-12 DIAGNOSIS — E7800 Pure hypercholesterolemia, unspecified: Secondary | ICD-10-CM | POA: Diagnosis not present

## 2024-02-20 NOTE — Telephone Encounter (Signed)
 Pts spouse returned call to discuss pts monitor results and recommendations. Results were given and pts spouse voiced understanding. Pt will call back if he wants a referral for the Afib clinic for ongoing management of AFIB.

## 2024-04-04 ENCOUNTER — Ambulatory Visit (INDEPENDENT_AMBULATORY_CARE_PROVIDER_SITE_OTHER): Admitting: Podiatry

## 2024-04-04 ENCOUNTER — Encounter: Payer: Self-pay | Admitting: Cardiology

## 2024-04-04 ENCOUNTER — Ambulatory Visit (INDEPENDENT_AMBULATORY_CARE_PROVIDER_SITE_OTHER)

## 2024-04-04 ENCOUNTER — Encounter: Payer: Self-pay | Admitting: Podiatry

## 2024-04-04 DIAGNOSIS — M722 Plantar fascial fibromatosis: Secondary | ICD-10-CM

## 2024-04-04 DIAGNOSIS — I739 Peripheral vascular disease, unspecified: Secondary | ICD-10-CM

## 2024-04-04 DIAGNOSIS — L600 Ingrowing nail: Secondary | ICD-10-CM

## 2024-04-04 MED ORDER — METHYLPREDNISOLONE 4 MG PO TBPK
ORAL_TABLET | ORAL | 0 refills | Status: DC
Start: 2024-04-04 — End: 2024-05-20

## 2024-04-04 NOTE — Patient Instructions (Signed)

## 2024-04-07 NOTE — Progress Notes (Signed)
 Subjective:  Patient ID: Colin Ryan, male    DOB: Mar 14, 1951,  MRN: [id number]  Chief Complaint  Patient presents with   Heel Spurs    Right heel pain x 2 months with pressure. 2 pain. Tried hot/cold packs and tylenol  for pain. Wearing Dr.scholls inserts. Non diabetic.    Discussed the use of AI scribe software for clinical note transcription with the patient, who gave verbal consent to proceed.  History of Present Illness Colin Ryan is a 73 year old male with peripheral arterial disease who presents with right heel pain.  He experiences right heel pain for the last couple of months, primarily around the heel's diameter, worsened by pressure when walking, especially in the mornings. Hot and cold packs, Tylenol , and shoe inserts provide some relief, but the pain persists, sometimes presenting as a burning sensation. Wearing shoes with inserts and using ice packs offer some comfort.  He has numbness and tingling in both feet, along with cold feet, for a long time. He wears booties to bed and keeps his feet covered due to the coldness. He recalls a past test indicating significant blockage in the right leg and some in the left. The last ultrasound was in January 2020.  He is prediabetic and has discoloration on the top of his feet, which he suspects may be related to blood flow issues. He has a history of toenail fungus leading to abnormal regrowth and occasionally experiences ingrown toenails, which he manages by trimming them himself.      Objective:    Physical Exam General: AAO x3, NAD  Dermatological: No ulcerations are present at this time.  Hallux toenail is discolored.  Mild increase in the nail with no significant pain today.  No edema, erythema.  Vascular: Dorsalis Pedis artery and Posterior Tibial artery pedal pulses are nonpalpable bilateral. There is no pain with calf compression, swelling, warmth, erythema.   Neruologic: Grossly intact via light touch bilateral.    Musculoskeletal: There is tenderness palpation along plantar aspect of calcaneus on insertion of plantar fascia on the right heel.  There is no pain with lateral compression of calcaneus.  No pain in the Achilles tendon.  Flexor, extensor tendons intact.  MMT 5/5.    No images are attached to the encounter.    Results RADIOLOGY Foot X-ray: Calcaneal spur on the plantar aspect of the heel. No evidence of acute fracture.    Assessment:   1. Plantar fasciitis of right foot   2. PAD (peripheral artery disease) (HCC)      Plan:  Patient was evaluated and treated and all questions answered.  Assessment and Plan Assessment & Plan Right plantar fasciitis with heel spur Chronic right heel pain with morning exacerbation and pressure sensitivity. X-rays confirm heel spur. Symptoms align with plantar fasciitis and possible muscle strain. Limited treatment options due to anticoagulation and circulation issues. Steroid injections contraindicated. - Prescribe oral steroids.  Medrol  Dosepak prescribed. - Advise daily icing of the heel.  Discussed short timeframe of doing this due to circulatory issues. - Provide a worksheet with stretching exercises. - Recommend use of a night splint. Static or dynamic ankle foot orthosis, including soft interface material, adjustable for fit, for positioning, may be used for minimal ambulation, prefabricated, off-the-shelf was dispensed on the billed date of service  - Suggest using Voltaren gel for topical anti-inflammatory treatment.  Peripheral arterial disease of bilateral lower extremities Significant arterial blockage in right leg, some in left. Symptoms include numbness, tingling, and  coldness. Last circulation test in January 2020.  - Order a new ultrasound to assess circulation in the lower extremities. - Discuss circulation issues with cardiologist during next appointment in September.  Onychomycosis and recurrent ingrown toenail, right great  toe Onychomycosis with toenail discoloration and deformation. Occasional ingrown toenail issues. Blood flow concerns limit treatment options. Avoidance of invasive procedures recommended due to risk of poor healing. - Advise against invasive procedures for ingrown toenail due to circulation issues. - Recommend routine debridement.  Monitor for any signs or symptoms of infection.   Return if symptoms worsen or fail to improve.   Donnice JONELLE Fees DPM

## 2024-04-08 ENCOUNTER — Other Ambulatory Visit: Payer: Self-pay | Admitting: Podiatry

## 2024-04-08 ENCOUNTER — Telehealth: Payer: Self-pay

## 2024-04-08 DIAGNOSIS — I739 Peripheral vascular disease, unspecified: Secondary | ICD-10-CM

## 2024-04-08 NOTE — Telephone Encounter (Signed)
 Received a message from [facility] and heart and vascular stating that [phone] (vas US  ABI w/wo TBI) is required in addition to VAS US  lower extremity per accreditation.

## 2024-04-24 ENCOUNTER — Encounter (HOSPITAL_COMMUNITY): Payer: Self-pay

## 2024-04-24 ENCOUNTER — Telehealth (HOSPITAL_COMMUNITY): Payer: Self-pay

## 2024-04-24 NOTE — Telephone Encounter (Signed)
 Attempted to contact the patient to schedule VAS US .  No answer.  Left message.  Third Attempt. Provided  direct contact number for scheduling: [phone].   Third attempt to reach patient by phone, sending letter, and order to be canceled in 30 days if no response is made. Will forward to requesting provider for documentation purposes.

## 2024-05-09 ENCOUNTER — Encounter (HOSPITAL_BASED_OUTPATIENT_CLINIC_OR_DEPARTMENT_OTHER): Payer: Self-pay

## 2024-05-13 ENCOUNTER — Encounter: Payer: Self-pay | Admitting: Cardiology

## 2024-05-13 ENCOUNTER — Ambulatory Visit: Attending: Cardiology | Admitting: Cardiology

## 2024-05-13 VITALS — BP 100/80 | HR 76 | Ht 72.0 in | Wt 278.0 lb

## 2024-05-13 DIAGNOSIS — I2510 Atherosclerotic heart disease of native coronary artery without angina pectoris: Secondary | ICD-10-CM

## 2024-05-13 DIAGNOSIS — I10 Essential (primary) hypertension: Secondary | ICD-10-CM

## 2024-05-13 DIAGNOSIS — G4733 Obstructive sleep apnea (adult) (pediatric): Secondary | ICD-10-CM

## 2024-05-13 DIAGNOSIS — I739 Peripheral vascular disease, unspecified: Secondary | ICD-10-CM | POA: Diagnosis not present

## 2024-05-13 DIAGNOSIS — I350 Nonrheumatic aortic (valve) stenosis: Secondary | ICD-10-CM

## 2024-05-13 DIAGNOSIS — I4819 Other persistent atrial fibrillation: Secondary | ICD-10-CM | POA: Diagnosis not present

## 2024-05-13 NOTE — Progress Notes (Signed)
 Cardiology Office Note:  .   Date:  05/20/2024  ID:  Colin Ryan, DOB [birth year], MRN [id number] PCP: Colin Righter, MD  [facility] Providers Cardiologist:  Colin Clay, MD     Chief Complaint  Patient presents with   Follow-up    Doing well.   Atrial Fibrillation    No symptoms.   Coronary Artery Disease    Patient Profile: .     Colin Ryan is a  73 y.o. male with a PMH notable for CAD, PAD, HTN, HLD and relatively new diagnosis of A-fib who presents here for 20-month follow-up at the request of Colin Righter, MD.  :   PMH: CAD-PCI (2001)-> CABG x3 (2003-LIMA-LAD, RIMA-OM, FrRad-OM), PERSISTENT AF. HTn, HLD & PAD CAD-IMI 01/2000-BMS PCI RCA x2 01/2001: PCI proximal LAD 10/2001: Cardiac catheter abnormal Myoview  with inferior ischemia => 50% ostial left main, AVG LCx 90% ostial 70% distal RCA.  EF 45 to 50%. CABG for Left Main and RCA disease: LIMA to LAD, RIMA to RPDA, L Rad-OM Myoview  January 2020: EF by computer 45%, mid LAD 55 to 60%.  Medium size moderate severity fixed defect in the basal to apical inferior, apical lateral and apical location consistent with prior infarct.  (Read as possible diaphragmatic attenuation-with normal wall motion.).  LOW RISK. TTE January 2020: Moderate LVH.  EF 55 to 60%.  No RWMA.  GR 1 DD.  Mild AI with aortic calcification. PAD:  (RFSA 100%, L SFA ~ 40%)  - med Rx; non-limiting claudication HTN, HLD New Dx Afib (asymptomatic) Jan 2025 - 100% on Monitor  = PERSISTENT AFIB  I last saw Mr. Pineiro on September 21, 2023 for essentially a 2-year follow-up: He was doing well.  Had lost about 40 pounds, regained 20 and then lost another 10.  He was going back to the gym and doing stationary bike and considering elliptical exercise.  Also was hoping to get back to walking regularly.  No active cardiac symptoms.  He did note some claudication symptoms.  However he did note that the more he walks, the claudication is better.  No wounds or sores.  EKG  that day showed atrial fibrillation-likely new onset A-fib that I did not necessarily see until after he had left.  We contacted and restarted him on Xarelto  and brought him in for close follow-up to discuss possible cardioversion and Zio patch monitor to determine A-fib burden.      Colin Ryan has been seen twice by Damien Braver, NP-initially on March 6-he was still in A-fib but asymptomatic.  No sensation of palpitations chest pain or dyspnea.  Tolerating Xarelto  without any significant bleeding.  Heart rates were controlled on Toprol  100 mg daily.  14-day Zio patch monitor was ordered to assess A-fib burden and echo ordered as well.  He declined A-fib clinic he did indicate that he was not using CPAP but really had no sensation or symptoms to suggest worsening OSA.  He denied any history of OSA.  He was most recently seen by Damien on April 11 again stated he was stable but asymptomatic on A-fib.  Exercising regularly.  Feeling well.  He was somewhat reluctant to consider other treatment options for his atrial fibrillation but deferring to my recommendations.  Opted to pursue rate control strategy with beta-blocker and Xarelto .  No angina symptoms.  No indication for ischemia.  Echo did show mild aortic stenosis with plan to follow-up echocardiogram in a year to reassess.  ABIs  checked.  Subjective  Discussed the use of AI scribe software for clinical note transcription with the patient, who gave verbal consent to proceed.  History of Present Illness Colin Ryan is a 72 year old male with persistent atrial fibrillation and peripheral artery disease who presents for follow-up of his cardiac conditions.  He has been experiencing persistent atrial fibrillation for approximately four to five months. He is 'pretty much always in AFib now' but does not feel significant symptoms such as heart palpitations, chest pain, or shortness of breath. He is currently on 20 mg of Xarelto  daily and 100 mg of  metoprolol . He occasionally feels a 'little flutter' but describes it as rare and not bothersome.  He denies any bleeding issues of melena, hematochezia, hematuria or epistaxis but does bruise a little easily.  An echocardiogram performed on November 28, 2023, showed a left ventricular ejection fraction of 50-55%, mild mitral valve regurgitation, and moderate aortic valve calcification with mild narrowing (stenosis), as described to the patient. He does not experience symptoms like chest pain or shortness of breath.  Regarding his peripheral artery disease, he has a history of 100% blockage in one leg and partial blockage in the other, diagnosed in 2020. He experiences pain around the perimeter of his heel, which worsens with weight-bearing, but does not have claudication symptoms. Prednisone has been prescribed in the past, which alleviated his heel pain temporarily. He also reports cold feet and some numbness, which he feels may have worsened.  He is on 5 mg of ramipril  twice daily for blood pressure management. No issues with blood in his stool or other side effects from his medications.  In terms of social history, he attends church regularly and uses the stairs without significant shortness of breath, although he needs to catch his breath when singing immediately after climbing stairs. He exercises at the gym three times a week, focusing on weight training and some stationary biking.   Cardiovascular ROS: no chest pain or dyspnea on exertion positive for - occasional irregular heartbeats.  Notes some cold feet and numbness as well as heel pain.  But not real claudication symptoms. negative for - irregular heartbeat, loss of consciousness, orthopnea, palpitations, paroxysmal nocturnal dyspnea, rapid heart rate, shortness of breath, or syncope or near syncope, TIA or emesis of gas, claudication  ROS:  Review of Systems - Negative except noted in HPIweight is less stable.    Objective   Current  Meds  Medication Sig   albuterol  (PROVENTIL  HFA;VENTOLIN  HFA) 108 (90 BASE) MCG/ACT inhaler Inhale 2 puffs into the lungs every 4 (four) hours as needed for wheezing or shortness of breath.   clonazePAM (KLONOPIN) 0.5 MG tablet Take 0.25-0.5 mg by mouth as needed.   metoprolol  succinate (TOPROL -XL) 100 MG 24 hr tablet TAKE 1 TABLET EVERYDAY AT BEDTIME.   nitroGLYCERIN  (NITROSTAT ) 0.4 MG SL tablet PLACE 1 TABLET UNDER TONGUE EVERY 5 (FIVE) MINUTES AS NEEDED FOR CHEST PAIN.   ramipril  (ALTACE ) 5 MG capsule Take 1 capsule (5 mg total) by mouth 2 (two) times daily.   rivaroxaban  (XARELTO ) 20 MG TABS tablet Take 1 tablet (20 mg total) by mouth daily with supper.   rosuvastatin  (CRESTOR ) 20 MG tablet Take 1 tablet (20 mg total) by mouth daily.    Studies Reviewed: SABRA   EKG Interpretation Date/Time:  Tuesday May 13 2024 14:49:56 EDT Ventricular Rate:  76 PR Interval:    QRS Duration:  104 QT Interval:  374 QTC Calculation: 420 R Axis:   -  64  Text Interpretation: Atrial fibrillation Left axis deviation Nonspecific ST abnormality When compared with ECG of 30-Nov-2023 13:38, No significant change was found Confirmed by Anner Lenis ([id number]) on 05/13/2024 3:46:53 PM    Labs from [facility]: 02/12/2024: TC 111, TG 79, HDL 40, LDL 55.  A1c 6.7. Results DIAGNOSTIC Echocardiogram: Ejection fraction 50-55%, no wall motion abnormalities, mild mitral regurgitation, moderate aortic valve calcification with mild stenosis, mean gradient 10 mmHg (11/28/2023)  12-day Zio patch (March 2025)   Patient was in atrial fibrillation 100% of the time with a rate rate range of 50 to 172 bpm and average of 87 bpm.   There were rare isolated PVCs and PVC couplets along with 1 run of 4 PVCs at a rate of 179 bpm.  LEA DOPPLERS (08/2018):  Right: Total occlusion noted in the superficial femoral artery with reconstituted flow distally. Total occlusion noted in the peroneal artery.   Left: 30-49% stenosis noted in the  superficial femoral artery. Heterogenous plaque throughout the left lower extremity arteries.   Risk Assessment/Calculations:    CHA2DS2-VASc Score = 4   This indicates a 4.8% annual risk of stroke. The patient's score is based upon: CHF History: 0 HTN History: 1 Diabetes History: 1 Stroke History: 0 Vascular Disease History: 1 Age Score: 1 Gender Score: 0         Physical Exam:   VS:  BP 100/80   Pulse 76   Ht 6' (1.829 m)   Wt 278 lb (126.1 kg)   SpO2 94%   BMI 37.70 kg/m    Wt Readings from Last 3 Encounters:  05/13/24 278 lb (126.1 kg)  11/30/23 274 lb (124.3 kg)  10/25/23 273 lb 6.4 oz (124 kg)     GEN: Well nourished, well developed in no acute distress; MODERATELY OBESE NECK: No JVD; No carotid bruits CARDIAC: Irregularly irregular rhythm with normal rate; normal S1, S2; , 1-2/6 sem @ RUSB; otw NO murmurs, rubs, gallops RESPIRATORY:  Clear to auscultation without rales, wheezing or rhonchi ; nonlabored, good air movement. ABDOMEN: Soft, non-tender, non-distended EXTREMITIES:  No edema; No deformity      ASSESSMENT AND PLAN: .    Problem List Items Addressed This Visit       Cardiology Problems   Coronary artery disease involving left main coronary artery - status post CABG x3 (Chronic)   Coronary artery disease with history of bypass surgery. No current angina or heart failure symptoms. Blood pressure well controlled. Continue current dose of Toprol -XL 100 mg daily, rosuvastatin  20 mg daily along with ramipril  5 midodrine daily. No active anginal symptoms.      Coronary artery disease involving native coronary artery of native heart without angina pectoris (Chronic)   No activity on current regimen of beta-blocker, ACE inhibitor along with rosuvastatin  20 mg daily.      Relevant Orders   EKG 12-Lead (Completed)   Essential hypertension (Chronic)   Pending, blood pressure is lowat 100/80 mmHg. No symptoms of dizziness or lightheadedness. - Continue  Ramipril  5 mg twice daily. - Hold Ramipril  if feeling dizzy or lightheaded.      Relevant Orders   EKG 12-Lead (Completed)   Mild aortic stenosis by prior echocardiogram (Chronic)   Echocardiogram shows mild stenosis with moderate calcification, low mean gradient of 10.  Also some mild mitral regurgitation. No symptoms of syncope or near syncope.  No TIA or amaurosis fugax.  No positional dizziness.      Peripheral arterial disease (Chronic)  100% occlusion in right leg, partial blockage in left leg. Symptoms include heel pain, cold feet, numbness. Heel pain likely due to plantar fasciitis. - Order lower extremity arterial Doppler ultrasound. - Refer to vascular specialist for further evaluation.      Relevant Orders   VAS US  LOWER EXTREMITY ARTERIAL DUPLEX   VAS US  ABI WITH/WO TBI   Ambulatory Referral for Peripheral Vascular Consult   Persistent atrial fibrillation (HCC) - Primary (Chronic)   Overall, he is pretty stable with no active symptoms but would prefer not to be in A-fib.  He does note that he may be slowing down a little bit  - Refer to electrophysiology for ablation and Watchman device consultation. - Continue Xarelto  20 mg daily. - Continue Metoprolol  100 mg daily for rate control. - Discuss potential cardioversion with electrophysiology.      Relevant Orders   EKG 12-Lead (Completed)   Ambulatory referral to Cardiac Electrophysiology     Other   Obstructive sleep apnea (Chronic)   According to him, he does not have symptoms of OSA.  Will continue to monitor.              Follow-Up: Return in about 6 months (around 11/10/2024) for Dr Anner.  I spent 51 minutes in the care of TREMAINE EARWOOD today including reviewing labs (1 minute), reviewing outside labs from May 2024 (1 minute), reviewing studies (Zio patch monitor, echo and Coronary CTA reviewed-6 minutes), reviewing outside studies (0 minutes), face to face time discussing treatment options (32 minutes),  reviewing records from my last note and APP notes (4 minutes), 7 minutes dictating, and documenting in the encounter.      Signed, Colin MICAEL Anner, MD, MS Colin Anner, M.D., M.S. Interventional Cardiologist  [facility] Pager # [phone]

## 2024-05-13 NOTE — Patient Instructions (Signed)
 Medication Instructions:  No changes *If you need a refill on your cardiac medications before your next appointment, please call your pharmacy*  Lab Work: None ordered If you have labs (blood work) drawn today and your tests are completely normal, you will receive your results only by: MyChart Message (if you have MyChart) OR A paper copy in the mail If you have any lab test that is abnormal or we need to change your treatment, we will call you to review the results.  Testing/Procedures: Your physician has requested that you have a lower extremity arterial duplex. This test is an ultrasound of the arteries in the legs or arms. It looks at arterial blood flow in the legs and arms. Allow one hour for Lower and Upper Arterial scans. There are no restrictions or special instructions.  Your physician has requested that you have an ankle brachial index (ABI). During this test an ultrasound and blood pressure cuff are used to evaluate the arteries that supply the arms and legs with blood. Allow thirty minutes for this exam. There are no restrictions or special instructions.  Please note: We ask at that you not bring children with you during ultrasound (echo/ vascular) testing. Due to room size and safety concerns, children are not allowed in the ultrasound rooms during exams. Our front office staff cannot provide observation of children in our lobby area while testing is being conducted. An adult accompanying a patient to their appointment will only be allowed in the ultrasound room at the discretion of the ultrasound technician under special circumstances. We apologize for any inconvenience.   Follow-Up: At [facility], you and your health needs are our priority.  As part of our continuing mission to provide you with exceptional heart care, our providers are all part of one team.  This team includes your primary Cardiologist (physician) and Advanced Practice Providers or APPs (Physician  Assistants and Nurse Practitioners) who all work together to provide you with the care you need, when you need it.  Your next appointment:    Feb/March 2026  Provider:   Alm Clay, MD    We recommend signing up for the patient portal called MyChart.  Sign up information is provided on this After Visit Summary.  MyChart is used to connect with patients for Virtual Visits (Telemedicine).  Patients are able to view lab/test results, encounter notes, upcoming appointments, etc.  Non-urgent messages can be sent to your provider as well.   To learn more about what you can do with MyChart, go to [email or web address].

## 2024-05-20 ENCOUNTER — Encounter: Payer: Self-pay | Admitting: Cardiology

## 2024-05-20 DIAGNOSIS — I350 Nonrheumatic aortic (valve) stenosis: Secondary | ICD-10-CM | POA: Insufficient documentation

## 2024-05-20 DIAGNOSIS — I2510 Atherosclerotic heart disease of native coronary artery without angina pectoris: Secondary | ICD-10-CM | POA: Insufficient documentation

## 2024-05-20 NOTE — Assessment & Plan Note (Signed)
 Echocardiogram shows mild stenosis with moderate calcification, low mean gradient of 10.  Also some mild mitral regurgitation. No symptoms of syncope or near syncope.  No TIA or amaurosis fugax.  No positional dizziness.

## 2024-05-20 NOTE — Assessment & Plan Note (Signed)
 Overall, he is pretty stable with no active symptoms but would prefer not to be in A-fib.  He does note that he may be slowing down a little bit  - Refer to electrophysiology for ablation and Watchman device consultation. - Continue Xarelto  20 mg daily. - Continue Metoprolol  100 mg daily for rate control. - Discuss potential cardioversion with electrophysiology.

## 2024-05-20 NOTE — Assessment & Plan Note (Signed)
 100% occlusion in right leg, partial blockage in left leg. Symptoms include heel pain, cold feet, numbness. Heel pain likely due to plantar fasciitis. - Order lower extremity arterial Doppler ultrasound. - Refer to vascular specialist for further evaluation.

## 2024-05-20 NOTE — Assessment & Plan Note (Signed)
 No activity on current regimen of beta-blocker, ACE inhibitor along with rosuvastatin  20 mg daily.

## 2024-05-20 NOTE — Assessment & Plan Note (Signed)
 Coronary artery disease with history of bypass surgery. No current angina or heart failure symptoms. Blood pressure well controlled. Continue current dose of Toprol -XL 100 mg daily, rosuvastatin  20 mg daily along with ramipril  5 midodrine daily. No active anginal symptoms.

## 2024-05-20 NOTE — Assessment & Plan Note (Signed)
 Pending, blood pressure is lowat 100/80 mmHg. No symptoms of dizziness or lightheadedness. - Continue Ramipril  5 mg twice daily. - Hold Ramipril  if feeling dizzy or lightheaded.

## 2024-05-20 NOTE — Assessment & Plan Note (Signed)
 According to him, he does not have symptoms of OSA.  Will continue to monitor.

## 2024-05-22 DIAGNOSIS — Z23 Encounter for immunization: Secondary | ICD-10-CM | POA: Diagnosis not present

## 2024-05-23 NOTE — Progress Notes (Signed)
 Cardiology Office Note   Date:  05/27/2024  ID:  Paras, Kreider [birth year], MRN [id number] PCP: [name], MD  [facility] Providers Cardiologist:  Alm Clay, MD Electrophysiologist:  Donnice DELENA Primus, MD   History of Present Illness Colin Ryan is a 73 y.o. male with persistent AF, CAD s/p 3v CABG, HTN, mild AS, and PAD who presents for arrhythmia management.   He is followed by Dr. Clay who last saw him on 05/13/2024.  He has been in persistent AF for 5 months but did not have much rhythm awareness.  He was on Xarelto  for stroke risk reduction and Toprol -XL 100 mg daily for rate control.  He has a history of atrial fibrillation, initially identified during a routine check-up. An EKG performed during that visit revealed the condition. He was started on a blood thinner to reduce the risk of stroke.  He experiences no significant symptoms such as shortness of breath or chest pain and maintains an active lifestyle, including gym workouts with stationary biking and weight machines. He is not consciously aware of palpitations or heart rate irregularities during these activities.  During a subsequent visit, potential procedures to address the atrial fibrillation, including cardioversion and ablation, were discussed. A heart monitor worn for two weeks confirmed persistent atrial fibrillation. An echocardiogram revealed a heart murmur and mild aortic stenosis.  He experiences occasional sensations of heart fluttering, particularly at night, described as a quivering feeling. He is concerned about the potential for long-term damage to his heart due to the atrial fibrillation and the possibility of the atria enlarging over time.  His current medications include a blood thinner, which he takes regularly to mitigate the risk of stroke associated with atrial fibrillation.   ROS: none   Studies Reviewed  ECG review 05/26/24: AF/VR 101, QRS 108, QT/c 346/448 05/13/34: AF/VR  76, QRS 104, QT/c 374/420 11/30/23: AF/VR 72, QRS 106, QT/c 396/433 10/25/23: AF/VR 80, QRS 114, QT/c 392/452 09/21/23: AF/VR 90, QRS 100, QT/c 364/445 11/10/14: ST 114, PR 154, QRS 96, QT/c 330/454 05/14/14: ST 103, PR 156, QRS 92, QT/c 338/442  TTE Result date: 11/28/23  1. Left ventricular ejection fraction, by estimation, is 50 to 55%. The  left ventricle has low normal function. The left ventricle has no regional  wall motion abnormalities. Left ventricular diastolic parameters are  indeterminate.   2. Right ventricular systolic function is normal. The right ventricular  size is mildly enlarged. There is normal pulmonary artery systolic  pressure. The estimated right ventricular systolic pressure is 14.8 mmHg.   3. The mitral valve is normal in structure. Mild mitral valve  regurgitation. No evidence of mitral stenosis. Moderate mitral annular  calcification.   4. DVI: 0.47. The aortic valve is calcified. There is moderate  calcification of the aortic valve. There is moderate thickening of the  aortic valve. Aortic valve regurgitation is mild. Mild aortic valve  stenosis. Aortic valve mean gradient measures 10.0  mmHg. Aortic valve Vmax measures 2.17 m/s.   5. The inferior vena cava is dilated in size with >50% respiratory  variability, suggesting right atrial pressure of 8 mmHg.   Risk Assessment/Calculations  CHA2DS2-VASc Score = 4  This indicates a 4.8% annual risk of stroke. The patient's score is based upon: CHF History: 0 HTN History: 1 Diabetes History: 1 Stroke History: 0 Vascular Disease History: 1 Age Score: 1 Gender Score: 0  Physical Exam VS:  BP 124/76 (BP Location: Left Arm, Patient Position: Sitting, Cuff  Size: Large)   Pulse (!) 101   Ht 6' (1.829 m)   Wt 270 lb (122.5 kg)   SpO2 95%   BMI 36.62 kg/m       Wt Readings from Last 3 Encounters:  05/26/24 270 lb (122.5 kg)  05/13/24 278 lb (126.1 kg)  11/30/23 274 lb (124.3 kg)    GEN: Well  nourished, well developed in no acute distress CARDIAC: irregular rate, rhythm controlled, 2/6 systolic murmur RUSB RESPIRATORY:  Clear to auscultation without rales, wheezing or rhonchi  EXTREMITIES:  No edema; No deformity   ASSESSMENT AND PLAN Colin Ryan is a 73 y.o. male with persistent AF, CAD s/p 3v CABG, HTN, mild AS, and PAD who presents for arrhythmia management.   Persistent atrial fibrillation Persistent atrial fibrillation with mild symptoms and no significant atrial enlargement as of April. He is on anticoagulation therapy to reduce stroke risk.  We discussed different management options for his atrial arrhythmias including restoration of sinus rhythm with cardioversion, antiarrhythmic occasions, and ablation.  At this time he is not interested in pursuing invasive measures but is interested in considering cardioversion.  I recommended that we at least consider cardioversion to see how long he stays out of atrial fibrillation and if he has any awareness when he is maintained in sinus rhythm.  He understands that the chance that he maintain sinus rhythm long-term without antiarrhythmic medications or ablation is low - Continue anticoagulation therapy. - Consider cardioversion to restore normal rhythm, understanding the potential for reversion to AFib. - Evaluate response to cardioversion and consider ablation if AFib persists, noting ablation offers a potential 80% reduction in AFib episodes with improved safety from newer techniques. - Discussed the Watchman device if long-term anticoagulation is not desired.  Nonrheumatic aortic valve stenosis Mild nonrheumatic aortic valve stenosis identified on echocardiogram. No significant narrowing requiring intervention. - Monitor for progression of aortic valve stenosis.    Dispo: RTC 3 months   A total of 50 minutes was spent preparing for the patient, reviewing history, performing exam, document encounter, coordinating care and  counseling the patient. 30 minutes was spent with direct patient care.   Signed, Donnice DELENA Primus, MD

## 2024-05-26 ENCOUNTER — Ambulatory Visit
Attending: Student in an Organized Health Care Education/Training Program | Admitting: Student in an Organized Health Care Education/Training Program

## 2024-05-26 ENCOUNTER — Encounter: Payer: Self-pay | Admitting: Student in an Organized Health Care Education/Training Program

## 2024-05-26 VITALS — BP 124/76 | HR 101 | Ht 72.0 in | Wt 270.0 lb

## 2024-05-26 DIAGNOSIS — I4819 Other persistent atrial fibrillation: Secondary | ICD-10-CM | POA: Diagnosis not present

## 2024-05-26 NOTE — Patient Instructions (Signed)
 Medication Instructions:  Your physician recommends that you continue on your current medications as directed. Please refer to the Current Medication list given to you today.  *If you need a refill on your cardiac medications before your next appointment, please call your pharmacy*  Lab Work: None ordered.  If you have labs (blood work) drawn today and your tests are completely normal, you will receive your results only by: MyChart Message (if you have MyChart) OR A paper copy in the mail If you have any lab test that is abnormal or we need to change your treatment, we will call you to review the results.  Testing/Procedures: None ordered.   Follow-Up: At [facility], you and your health needs are our priority.  As part of our continuing mission to provide you with exceptional heart care, our providers are all part of one team.  This team includes your primary Cardiologist (physician) and Advanced Practice Providers or APPs (Physician Assistants and Nurse Practitioners) who all work together to provide you with the care you need, when you need it.  Your next appointment:   3 months with Dr Almetta

## 2024-05-27 ENCOUNTER — Encounter: Payer: Self-pay | Admitting: Student in an Organized Health Care Education/Training Program

## 2024-06-03 ENCOUNTER — Ambulatory Visit (HOSPITAL_BASED_OUTPATIENT_CLINIC_OR_DEPARTMENT_OTHER)
Admission: RE | Admit: 2024-06-03 | Discharge: 2024-06-03 | Disposition: A | Discharge status: Home / Self Care | Source: Ambulatory Visit | Attending: Cardiology | Admitting: Cardiology

## 2024-06-03 ENCOUNTER — Ambulatory Visit (HOSPITAL_COMMUNITY)
Admission: RE | Admit: 2024-06-03 | Discharge: 2024-06-03 | Disposition: A | Discharge status: Home / Self Care | Source: Ambulatory Visit | Attending: Cardiology | Admitting: Cardiology

## 2024-06-03 DIAGNOSIS — I739 Peripheral vascular disease, unspecified: Secondary | ICD-10-CM | POA: Diagnosis present

## 2024-06-04 LAB — VAS US ABI WITH/WO TBI
Left ABI: 0.73
Right ABI: 0.67

## 2024-06-05 ENCOUNTER — Ambulatory Visit: Payer: Self-pay | Admitting: Cardiology

## 2024-06-11 ENCOUNTER — Encounter: Payer: Self-pay | Admitting: Cardiovascular Disease

## 2024-06-11 ENCOUNTER — Ambulatory Visit: Attending: Cardiology | Admitting: Cardiovascular Disease

## 2024-06-11 VITALS — BP 110/70 | HR 82 | Ht 72.0 in | Wt 270.5 lb

## 2024-06-11 DIAGNOSIS — I739 Peripheral vascular disease, unspecified: Secondary | ICD-10-CM | POA: Diagnosis not present

## 2024-06-11 NOTE — Progress Notes (Signed)
 06/11/2024 Colin Ryan   1951/07/07  [id number]  Primary Physician [name], MD Primary Cardiologist: [name] JINNY Lesches MD FACP, [place], [place], [place]  HPI:  Colin Ryan is a 73 y.o.   morbidly overweight married Caucasian male father of 2 children who i I last saw in the office 09/10/2018.  I apparently did a heart cath on his wife Colin Ryan 13 years ago.  He has a history of 70 pack years of tobacco abuse having quit 15 years ago, treated hypertension and hyperlipidemia.  He has had an MI back in 2001 treated with stenting by Dr. [name] and subsequent bypass grafting.  He is noted increasing shortness of breath recently and had a 2D echo that was unremarkable and a nonischemic Myoview .  He does complain of bilateral leg pain left greater than right.  He has had bilateral knee replacements by Dr. [name].  His legs do not hurt him when he is riding a recumbent bicycle or walking on a treadmill but they do when he is walking on a flat surface.  Doppler studies performed 09/01/2018 revealed a right ABI 0.72 with an occluded right SFA and normal left ABI.  He also complains of usual hip pain mostly at night when he is lying on his hip.  Since I saw him almost 6 years ago he was referred by [name] for follow-up of PAD.  His lower extremities show Doppler studies performed 06/03/2024 revealed bilateral ABIs in the 0.7 range bilaterally.  His left ABI had declined from 1.10 previously.  He appears to have occluded SFAs bilaterally.  He really has very minimal none lifestyle-limiting claudication if at all.  He denies chest pain or shortness of breath.  He recently has been diagnosed with A-fib and was placed on Xarelto .  He saw Dr. [name] who suggested outpatient DC cardioversion as an initial treatment plan.  He is fairly active and goes to the gym 3 days a week for an hour and walks a mile around the block without limitation.   Current Meds  Medication Sig   albuterol  (PROVENTIL   HFA;VENTOLIN  HFA) 108 (90 BASE) MCG/ACT inhaler Inhale 2 puffs into the lungs every 4 (four) hours as needed for wheezing or shortness of breath.   clonazePAM (KLONOPIN) 0.5 MG tablet Take 0.25-0.5 mg by mouth as needed.   metoprolol  succinate (TOPROL -XL) 100 MG 24 hr tablet TAKE 1 TABLET EVERYDAY AT BEDTIME.   nitroGLYCERIN  (NITROSTAT ) 0.4 MG SL tablet PLACE 1 TABLET UNDER TONGUE EVERY 5 (FIVE) MINUTES AS NEEDED FOR CHEST PAIN.   ramipril  (ALTACE ) 5 MG capsule Take 1 capsule (5 mg total) by mouth 2 (two) times daily.   rivaroxaban  (XARELTO ) 20 MG TABS tablet Take 1 tablet (20 mg total) by mouth daily with supper.   rosuvastatin  (CRESTOR ) 20 MG tablet Take 1 tablet (20 mg total) by mouth daily.     Allergies  Allergen Reactions   Lipitor [Atorvastatin ] Other (See Comments)    UNSPECIFIED.    Zocor [Simvastatin] Other (See Comments)    UNSPECIFIED.     Social History   Socioeconomic History   Marital status: Married    Spouse name: Not on file   Number of children: Not on file   Years of education: Not on file   Highest education level: Not on file  Occupational History   Not on file  Tobacco Use   Smoking status: Former    Current packs/day: 0.00    Average packs/day: 1  pack/day for 25.0 years (25.0 ttl pk-yrs)    Types: Cigarettes    Start date: 10/29/1973    Quit date: 10/30/1998    Years since quitting: 25.6   Smokeless tobacco: Never  Substance and Sexual Activity   Alcohol use: No   Drug use: No   Sexual activity: Not on file  Other Topics Concern   Not on file  Social History Narrative   He is a married father of 2.    Still trying to get back into exercise -- just has a hard time keeping up.   He does not drink, does not smoke; he quit smoking in 2003 shortly after his CABG.    Social Drivers of Corporate investment banker Strain: Not on file  Food Insecurity: Low Risk  (05/15/2023)   Received from [facility]   Hunger Vital Sign    Within the past 12  months, you worried that your food would run out before you got money to buy more: Never true    Within the past 12 months, the food you bought just didn't last and you didn't have money to get more. : Never true  Transportation Needs: No Transportation Needs (05/15/2023)   Received from [facility]    In the past 12 months, has lack of reliable transportation kept you from medical appointments, meetings, work or from getting things needed for daily living? : No  Physical Activity: Not on file  Stress: Not on file  Social Connections: Not on file  Intimate Partner Violence: Not on file     Review of Systems: General: negative for chills, fever, night sweats or weight changes.  Cardiovascular: negative for chest pain, dyspnea on exertion, edema, orthopnea, palpitations, paroxysmal nocturnal dyspnea or shortness of breath Dermatological: negative for rash Respiratory: negative for cough or wheezing Urologic: negative for hematuria Abdominal: negative for nausea, vomiting, diarrhea, bright red blood per rectum, melena, or hematemesis Neurologic: negative for visual changes, syncope, or dizziness All other systems reviewed and are otherwise negative except as noted above.    Blood pressure 110/70, pulse 82, height 6' (1.829 m), weight 270 lb 8 oz (122.7 kg), SpO2 92%.  General appearance: alert and no distress Neck: no adenopathy, no carotid bruit, no JVD, supple, symmetrical, trachea midline, and thyroid  not enlarged, symmetric, no tenderness/mass/nodules Lungs: clear to auscultation bilaterally Heart: irregularly irregular rhythm Extremities: extremities normal, atraumatic, no cyanosis or edema Pulses: Absent pedal pulses Skin: Skin color, texture, turgor normal. No rashes or lesions Neurologic: Grossly normal  EKG not performed today      ASSESSMENT AND PLAN:   Peripheral arterial disease Mr. [name] returns today for follow-up of PAD.  I last saw him in the  office for similar assessment 09/10/2018.  His most recent Doppler studies revealed a right ABI of 0.67 and a left of 0.73.  His left ABI had declined from 1.10 previously.  It appears he has bilateral SFA occlusion.  He has minimal claudication which is not lifestyle limiting.  At this point, I would not pursue an invasive evaluation or add pharmacologic therapy.  Should his symptoms become worse we can consider trying him on empiric Pletal therapy.     [name] DOROTHA Lesches MD FACP,FACC,FAHA, [facility] 06/11/2024 3:11 PM

## 2024-06-11 NOTE — Patient Instructions (Signed)

## 2024-06-11 NOTE — Assessment & Plan Note (Signed)
 Colin Ryan returns today for follow-up of PAD.  I last saw him in the office for similar assessment 09/10/2018.  His most recent Doppler studies revealed a right ABI of 0.67 and a left of 0.73.  His left ABI had declined from 1.10 previously.  It appears he has bilateral SFA occlusion.  He has minimal claudication which is not lifestyle limiting.  At this point, I would not pursue an invasive evaluation or add pharmacologic therapy.  Should his symptoms become worse we can consider trying him on empiric Pletal therapy.

## 2024-06-27 ENCOUNTER — Encounter: Payer: Self-pay | Admitting: Emergency Medicine

## 2024-07-30 DIAGNOSIS — H2513 Age-related nuclear cataract, bilateral: Secondary | ICD-10-CM | POA: Diagnosis not present

## 2024-07-30 DIAGNOSIS — H5203 Hypermetropia, bilateral: Secondary | ICD-10-CM | POA: Diagnosis not present

## 2024-07-30 DIAGNOSIS — H25013 Cortical age-related cataract, bilateral: Secondary | ICD-10-CM | POA: Diagnosis not present

## 2024-09-09 ENCOUNTER — Ambulatory Visit: Payer: Self-pay | Admitting: Student in an Organized Health Care Education/Training Program

## 2024-09-13 NOTE — Progress Notes (Unsigned)
" °  Cardiology Office Note   Date:  09/13/2024  ID:  Isaish, Alemu June 06, 1951, MRN [id number] PCP: [name], MD  [facility] Providers Cardiologist:  Alm Clay, MD Electrophysiologist:  Donnice DELENA Primus, MD { Click to update primary MD,subspecialty MD or APP then REFRESH:1}    History of Present Illness SIMUEL STEBNER is a 74 y.o. male ***  ROS: ***  Studies Reviewed      *** Risk Assessment/Calculations {Does this patient have ATRIAL FIBRILLATION?[phone]} No BP recorded.  {Refresh Note OR Click here to enter BP  :1}***       Physical Exam VS:  There were no vitals taken for this visit.       Wt Readings from Last 3 Encounters:  06/11/24 270 lb 8 oz (122.7 kg)  05/26/24 270 lb (122.5 kg)  05/13/24 278 lb (126.1 kg)    GEN: Well nourished, well developed in no acute distress NECK: No JVD; No carotid bruits CARDIAC: ***RRR, no murmurs, rubs, gallops RESPIRATORY:  Clear to auscultation without rales, wheezing or rhonchi  ABDOMEN: Soft, non-tender, non-distended EXTREMITIES:  No edema; No deformity   ASSESSMENT AND PLAN ***    {Are you ordering a CV Procedure (e.g. stress test, cath, DCCV, TEE, etc)?   Press F2        :[id number]}  Dispo: ***  Signed, Donnice DELENA Primus, MD  "

## 2024-09-19 ENCOUNTER — Ambulatory Visit
Payer: Self-pay | Attending: Student in an Organized Health Care Education/Training Program | Admitting: Student in an Organized Health Care Education/Training Program

## 2024-09-19 ENCOUNTER — Encounter: Payer: Self-pay | Admitting: Student in an Organized Health Care Education/Training Program

## 2024-09-19 VITALS — BP 124/72 | HR 85 | Ht 73.0 in | Wt 279.2 lb

## 2024-09-19 DIAGNOSIS — I4811 Longstanding persistent atrial fibrillation: Secondary | ICD-10-CM | POA: Diagnosis not present

## 2024-09-19 NOTE — Progress Notes (Unsigned)
 " Cardiology Office Note   Date: 09/19/24 ID:  Colin Ryan, Colin Ryan February 16, 1951, MRN [id number] PCP: [name], MD  [facility] Providers Cardiologist:  Alm Clay, MD Electrophysiologist:  Donnice DELENA Primus, MD   History of Present Illness Colin Ryan is a 74 y.o. male longstanding persistent AF, CAD s/p 3v CABG, HTN, mild AS, and PAD who presents for arrhythmia management.   Discussed the use of AI scribe software for clinical note transcription with the patient, who gave verbal consent to proceed. History of Present Illness Colin Ryan is a 74 year old male with atrial fibrillation who presents for discussion about cardioversion.  He has been experiencing atrial fibrillation for approximately one year, first detected on an ECG on 09/21/23. He is currently asymptomatic, with no shortness of breath or near syncope. His heart rate is generally well-controlled, ranging from the seventies to nineties, and he is on Xarelto  for anticoagulation.  He is cautious about not wanting to increase his medication burden, currently taking several medications daily, including Xarelto .  He has been retired for 12-15 years, engages in activities like playing pool and fishing, and goes to the gym three times a week. He has recently gained 10-12 pounds over the holidays. He has a history of gastrointestinal issues, which he attributes to a salad he ate, leading to GI problems that resolved with Pepto Bismol. He canceled a previous appointment due to these symptoms.  He reports no symptoms of shortness of breath, near syncope, or any other symptoms related to his atrial fibrillation.  ROS: none  Studies Reviewed  ECG review 05/26/24: AF/VR 101, QRS 108, QT/c 346/448 05/13/34: AF/VR 76, QRS 104, QT/c 374/420 11/30/23: AF/VR 72, QRS 106, QT/c 396/433 10/25/23: AF/VR 80, QRS 114, QT/c 392/452 09/21/23: AF/VR 90, QRS 100, QT/c 364/445 11/10/14: ST 114, PR 154, QRS 96, QT/c 330/454 05/14/14:  ST 103, PR 156, QRS 92, QT/c 338/442   TTE Result date: 11/28/23  1. Left ventricular ejection fraction, by estimation, is 50 to 55%. The  left ventricle has low normal function. The left ventricle has no regional  wall motion abnormalities. Left ventricular diastolic parameters are  indeterminate.   2. Right ventricular systolic function is normal. The right ventricular  size is mildly enlarged. There is normal pulmonary artery systolic  pressure. The estimated right ventricular systolic pressure is 14.8 mmHg.   3. The mitral valve is normal in structure. Mild mitral valve  regurgitation. No evidence of mitral stenosis. Moderate mitral annular  calcification.   4. DVI: 0.47. The aortic valve is calcified. There is moderate  calcification of the aortic valve. There is moderate thickening of the  aortic valve. Aortic valve regurgitation is mild. Mild aortic valve  stenosis. Aortic valve mean gradient measures 10.0  mmHg. Aortic valve Vmax measures 2.17 m/s.   5. The inferior vena cava is dilated in size with >50% respiratory  variability, suggesting right atrial pressure of 8 mmHg.   Risk Assessment/Calculations  CHA2DS2-VASc Score = 4  This indicates a 4.8% annual risk of stroke. The patient's score is based upon: CHF History: 0 HTN History: 1 Diabetes History: 1 Stroke History: 0 Vascular Disease History: 1 Age Score: 1 Gender Score: 0  Physical Exam VS:  BP 124/72   Pulse 85   Ht 6' 1 (1.854 m)   Wt 279 lb 3.2 oz (126.6 kg)   SpO2 93%   BMI 36.84 kg/m   Wt Readings from Last 3 Encounters:  09/19/24 279  lb 3.2 oz (126.6 kg)  06/11/24 270 lb 8 oz (122.7 kg)  05/26/24 270 lb (122.5 kg)    GEN: Well nourished, well developed in no acute distress NECK: No JVD; No carotid bruits CARDIAC: irregular rhythm, rate controlled, no murmurs, rubs, gallops RESPIRATORY:  Clear to auscultation without rales, wheezing or rhonchi  ABDOMEN: Soft, non-tender,  non-distended EXTREMITIES:  No edema; No deformity   ASSESSMENT AND PLAN Colin Ryan is a 75 y.o. male longstanding persistent AF, CAD s/p 3v CABG, HTN, mild AS, and PAD who presents for arrhythmia management.  Assessment & Plan Longstanding Persistent atrial fibrillation Heart rate controlled, heart function low normal, left atrium not enlarged in 11/2023 but likely at least mild-moderate LAE now after being in AF for a year. Discussed cardioversion benefits and risks, including anesthesia and potential AFib recurrence. Cardioversion low risk with Xarelto . Discussed future ablation and Watchman device if cardioversion beneficial. Emphasized early intervention if we are going to get him back in NSR.  I had recommended that he consider cardioversion during our last visit 3 months ago.  He was questioning whether we should do anything since he feels like he is generally asymptomatic.  I explained that if we leave him in longstanding persistent AF without attempt at cardioversion then he will remain in permanent AF for the rest of his life most likely.  In this scenario it his left atrium will likely dilate with time and if he starts to become symptomatic in several years time we are more limited in what options we have for rhythm management.  - Consider cardioversion to assess if any symptoms and heart efficiency. - Continue Xarelto  to minimize stroke risk during cardioversion. - If cardioversion beneficial, consider ablation and Watchman device to discontinue blood thinners.  Nonrheumatic aortic valve stenosis Management under ongoing consideration; potential changes discussed in context of atrial fibrillation treatment.  Informed Consent   Shared Decision Making/Informed Consent The risks (stroke, cardiac arrhythmias rarely resulting in the need for a temporary or permanent pacemaker, skin irritation or burns and complications associated with conscious sedation including aspiration, arrhythmia,  respiratory failure and death), benefits (restoration of normal sinus rhythm) and alternatives of a direct current cardioversion were explained in detail to Colin Ryan and he wants to consider whether he wants to proceed.  Dispo: RTC 6 months, he will call if he wants to proceed with cardioversion  Signed, Donnice DELENA Primus, MD  "

## 2024-09-19 NOTE — Patient Instructions (Signed)
 Medication Instructions:  Your physician recommends that you continue on your current medications as directed. Please refer to the Current Medication list given to you today.  *If you need a refill on your cardiac medications before your next appointment, please call your pharmacy*  Lab Work: None ordered.  If you have labs (blood work) drawn today and your tests are completely normal, you will receive your results only by: MyChart Message (if you have MyChart) OR A paper copy in the mail If you have any lab test that is abnormal or we need to change your treatment, we will call you to review the results.  Testing/Procedures: None ordered.   Follow-Up: At [facility], you and your health needs are our priority.  As part of our continuing mission to provide you with exceptional heart care, our providers are all part of one team.  This team includes your primary Cardiologist (physician) and Advanced Practice Providers or APPs (Physician Assistants and Nurse Practitioners) who all work together to provide you with the care you need, when you need it.  Your next appointment:   6 months with Dr Almetta or APP
